# Patient Record
Sex: Male | Born: 1950
Health system: Southern US, Community
[De-identification: ages and names within clinical notes are randomized; demographics above are authoritative.]

## PROBLEM LIST (undated history)

## (undated) DIAGNOSIS — I209 Angina pectoris, unspecified: Secondary | ICD-10-CM

## (undated) DIAGNOSIS — E7800 Pure hypercholesterolemia, unspecified: Secondary | ICD-10-CM

## (undated) DIAGNOSIS — Z87442 Personal history of urinary calculi: Secondary | ICD-10-CM

## (undated) DIAGNOSIS — E119 Type 2 diabetes mellitus without complications: Secondary | ICD-10-CM

## (undated) DIAGNOSIS — I2510 Atherosclerotic heart disease of native coronary artery without angina pectoris: Secondary | ICD-10-CM

## (undated) DIAGNOSIS — Z22322 Carrier or suspected carrier of Methicillin resistant Staphylococcus aureus: Secondary | ICD-10-CM

## (undated) HISTORY — PX: CARDIAC CATHETERIZATION: SHX172

## (undated) HISTORY — PX: WISDOM TOOTH EXTRACTION: SHX21

## (undated) HISTORY — PX: VASECTOMY: SHX75

## (undated) HISTORY — DX: Carrier or suspected carrier of methicillin resistant Staphylococcus aureus: Z22.322

---

## 1999-09-15 ENCOUNTER — Encounter: Admission: RE | Admit: 1999-09-15 | Discharge: 1999-12-14 | Payer: Self-pay | Admitting: Internal Medicine

## 2000-03-08 ENCOUNTER — Emergency Department (HOSPITAL_COMMUNITY): Admission: EM | Admit: 2000-03-08 | Discharge: 2000-03-09 | Payer: Self-pay | Admitting: Emergency Medicine

## 2000-03-09 ENCOUNTER — Encounter: Payer: Self-pay | Admitting: Urology

## 2000-03-09 ENCOUNTER — Encounter: Payer: Self-pay | Admitting: Emergency Medicine

## 2000-03-09 ENCOUNTER — Ambulatory Visit (HOSPITAL_COMMUNITY): Admission: RE | Admit: 2000-03-09 | Discharge: 2000-03-09 | Payer: Self-pay | Admitting: Urology

## 2000-03-29 ENCOUNTER — Encounter: Payer: Self-pay | Admitting: Urology

## 2000-03-29 ENCOUNTER — Encounter: Admission: RE | Admit: 2000-03-29 | Discharge: 2000-03-29 | Payer: Self-pay | Admitting: Hematology and Oncology

## 2000-04-05 ENCOUNTER — Emergency Department (HOSPITAL_COMMUNITY): Admission: EM | Admit: 2000-04-05 | Discharge: 2000-04-05 | Payer: Self-pay | Admitting: *Deleted

## 2000-04-14 ENCOUNTER — Encounter: Payer: Self-pay | Admitting: Urology

## 2000-04-14 ENCOUNTER — Encounter: Admission: RE | Admit: 2000-04-14 | Discharge: 2000-04-14 | Payer: Self-pay | Admitting: Urology

## 2002-12-20 ENCOUNTER — Ambulatory Visit (HOSPITAL_BASED_OUTPATIENT_CLINIC_OR_DEPARTMENT_OTHER): Admission: RE | Admit: 2002-12-20 | Discharge: 2002-12-20 | Payer: Self-pay | Admitting: Urology

## 2002-12-20 ENCOUNTER — Encounter (INDEPENDENT_AMBULATORY_CARE_PROVIDER_SITE_OTHER): Payer: Self-pay | Admitting: Specialist

## 2004-12-28 ENCOUNTER — Ambulatory Visit: Payer: Self-pay | Admitting: Internal Medicine

## 2005-01-05 ENCOUNTER — Ambulatory Visit: Payer: Self-pay | Admitting: Internal Medicine

## 2008-08-22 ENCOUNTER — Ambulatory Visit: Payer: Self-pay | Admitting: Internal Medicine

## 2008-12-09 ENCOUNTER — Ambulatory Visit: Payer: Self-pay | Admitting: Internal Medicine

## 2009-05-11 ENCOUNTER — Encounter: Admission: RE | Admit: 2009-05-11 | Discharge: 2009-05-11 | Payer: Self-pay | Admitting: Internal Medicine

## 2009-05-11 ENCOUNTER — Ambulatory Visit: Payer: Self-pay | Admitting: Internal Medicine

## 2009-05-12 ENCOUNTER — Ambulatory Visit: Payer: Self-pay | Admitting: Internal Medicine

## 2009-05-18 ENCOUNTER — Ambulatory Visit: Payer: Self-pay | Admitting: Internal Medicine

## 2009-05-25 ENCOUNTER — Ambulatory Visit: Payer: Self-pay | Admitting: Internal Medicine

## 2009-10-13 ENCOUNTER — Ambulatory Visit: Payer: Self-pay | Admitting: Internal Medicine

## 2010-10-29 NOTE — Op Note (Signed)
   NAME:  Thomas Washington, Thomas Washington NO.:  [id number]   MEDICAL RECORD NO.:  [id number]                   PATIENT TYPE:  AMB   LOCATION:  [facility]                                 FACILITY:  [facility]   PHYSICIAN:  Susanne Borders, MD                     DATE OF BIRTH:  08/10/1950   DATE OF PROCEDURE:  12/20/2002  DATE OF DISCHARGE:                                 OPERATIVE REPORT   d   PREOPERATIVE DIAGNOSIS:  Desires sterility.   POSTOPERATIVE DIAGNOSIS:  Desires sterility.   PROCEDURE:  Bilateral vasectomy.   SURGEON:  Jamison Neighbor, M.D.   ASSISTANT:  Susanne Borders, MD   ANESTHESIA:  General endotracheal.   COMPLICATIONS:  None.   PATHOLOGY:  Bilateral vas deferens segments to pathology.   INDICATIONS FOR PROCEDURE:  [name] is a 60 year old male who desires  sterility. He was unable to undergo vasectomy at the office as he had  slightly tethered spermatic cords and he had significant pain upon  manipulation. He desired to undergo vasectomy under anesthesia in the  operating room.   DESCRIPTION OF PROCEDURE:  A small opening in the midline of the scrotum is  made with pointed [place] hemostat. The left vas was grasped and pulled  through this opening and the tissue around the vas was removed. The vas was  then clamped with hemostats and a small segment was removed. Both ends of  the vas were cauterized and tied with 3-0 Vicryl. Dartos tissue was  interposed between the two segments. Next, the right vas was grasped and  pulled through the midline wound and extra tissue around the vas was  removed. The vas was clamped with hemostats and a small segment was removed  and sent to pathology. Both ends were cauterized carefully and tied with 3-0  Vicryl. Again dartos tissue was interposed between the two segments. The  dartos defect was closed with a 3-0 Vicryl single 3-0 Vicryl stitch and the  skin was closed with 3-0 Vicryl stitch. Dermabond was applied to the  skin  wound. The patient then awakened easily from his anesthesia and was taken to  the post anesthesia care unit in stable condition. Please noted that Dr.  Logan Bores was present and participated throughout the case. There were no  complications.                                               Susanne Borders, MD    DR/MEDQ  D:  12/20/2002  T:  12/20/2002  Job:  [id number]

## 2012-06-01 ENCOUNTER — Encounter: Payer: Self-pay | Admitting: Internal Medicine

## 2012-06-01 ENCOUNTER — Ambulatory Visit (INDEPENDENT_AMBULATORY_CARE_PROVIDER_SITE_OTHER): Payer: 59 | Admitting: Internal Medicine

## 2012-06-01 ENCOUNTER — Other Ambulatory Visit: Payer: Self-pay | Admitting: Internal Medicine

## 2012-06-01 DIAGNOSIS — E119 Type 2 diabetes mellitus without complications: Secondary | ICD-10-CM

## 2012-06-01 DIAGNOSIS — Z0184 Encounter for antibody response examination: Secondary | ICD-10-CM

## 2012-06-01 DIAGNOSIS — Z23 Encounter for immunization: Secondary | ICD-10-CM

## 2012-06-01 MED ORDER — TETANUS-DIPHTH-ACELL PERTUSSIS 5-2.5-18.5 LF-MCG/0.5 IM SUSP: 0.5000 mL | Freq: Once | INTRAMUSCULAR | Status: DC

## 2012-06-01 NOTE — Progress Notes (Signed)
  Subjective:    Patient ID: Thomas Washington, male    DOB: 09/12/1950, 61 y.o.   MRN: [id number]  HPI Taking new job as Teacher, early years/pre at [employer]. Needs to have proof of hepatitis B series. He had this a number of years ago. Hepatitis B surface antibody drawn today. He also needs a TB skin test. PPD applied. Patient has a history of type 2 diabetes mellitus. Hemoglobin A1c drawn today. He needs to have a physical exam in the near future and will make an appointment. History of hyperlipidemia. Says he had Varicella as a child and they will take his work for that so we do not need to draw titer today. He needs a TD at vaccine which was given today.    Review of Systems     Objective:   Physical Exam not examined        Assessment & Plan:  He'll have his wife who is a physician read PPD in 48-72 hours. He will call me if results are positive. Hemoglobin A1c drawn and is pending. Hep B surface antibody pending.

## 2012-06-01 NOTE — Patient Instructions (Addendum)
Hepatitis B surface antibody drawn today. PPD applied. Hemoglobin A1c pending.

## 2012-06-02 LAB — HEMOGLOBIN A1C: Hgb A1c MFr Bld: 7.8 % — ABNORMAL HIGH (ref <5.7)

## 2012-06-02 LAB — HEPATITIS B SURFACE ANTIBODY,QUALITATIVE: Hep B S Ab: NONREACTIVE

## 2012-06-04 ENCOUNTER — Telehealth: Payer: Self-pay

## 2012-06-04 NOTE — Telephone Encounter (Signed)
Patient informed of labs. Non-reactive hepatitis B titer. Will need series repeated, per Dr. Lenord Fellers.

## 2012-06-14 ENCOUNTER — Ambulatory Visit (INDEPENDENT_AMBULATORY_CARE_PROVIDER_SITE_OTHER): Payer: 59 | Admitting: Internal Medicine

## 2012-06-14 DIAGNOSIS — Z23 Encounter for immunization: Secondary | ICD-10-CM

## 2012-06-14 MED ORDER — HEPATITIS B VAC RECOMBINANT 5 MCG/0.5ML IJ SUSP: 0.5000 mL | Freq: Once | INTRAMUSCULAR | Status: DC

## 2012-06-18 NOTE — Patient Instructions (Signed)
Patient states PPD placed on 06/01/2012 read as negative on 06/03/2012.0 mm induration

## 2012-07-16 ENCOUNTER — Ambulatory Visit (INDEPENDENT_AMBULATORY_CARE_PROVIDER_SITE_OTHER): Payer: BC Managed Care – PPO | Admitting: Internal Medicine

## 2012-07-16 DIAGNOSIS — Z23 Encounter for immunization: Secondary | ICD-10-CM

## 2012-09-11 ENCOUNTER — Encounter: Payer: Self-pay | Admitting: Internal Medicine

## 2012-09-11 ENCOUNTER — Encounter (HOSPITAL_COMMUNITY): Payer: Self-pay | Admitting: *Deleted

## 2012-09-11 ENCOUNTER — Emergency Department (HOSPITAL_COMMUNITY)
Admission: EM | Admit: 2012-09-11 | Discharge: 2012-09-11 | Disposition: A | Discharge status: Home / Self Care | Payer: BC Managed Care – PPO | Attending: Emergency Medicine | Admitting: Emergency Medicine

## 2012-09-11 ENCOUNTER — Emergency Department (HOSPITAL_COMMUNITY): Payer: BC Managed Care – PPO

## 2012-09-11 DIAGNOSIS — E119 Type 2 diabetes mellitus without complications: Secondary | ICD-10-CM | POA: Insufficient documentation

## 2012-09-11 DIAGNOSIS — Z7982 Long term (current) use of aspirin: Secondary | ICD-10-CM | POA: Insufficient documentation

## 2012-09-11 DIAGNOSIS — Z79899 Other long term (current) drug therapy: Secondary | ICD-10-CM | POA: Insufficient documentation

## 2012-09-11 DIAGNOSIS — I319 Disease of pericardium, unspecified: Secondary | ICD-10-CM

## 2012-09-11 DIAGNOSIS — E785 Hyperlipidemia, unspecified: Secondary | ICD-10-CM | POA: Insufficient documentation

## 2012-09-11 DIAGNOSIS — E7800 Pure hypercholesterolemia, unspecified: Secondary | ICD-10-CM | POA: Insufficient documentation

## 2012-09-11 DIAGNOSIS — E669 Obesity, unspecified: Secondary | ICD-10-CM | POA: Insufficient documentation

## 2012-09-11 DIAGNOSIS — R0602 Shortness of breath: Secondary | ICD-10-CM | POA: Insufficient documentation

## 2012-09-11 HISTORY — DX: Pure hypercholesterolemia, unspecified: E78.00

## 2012-09-11 HISTORY — DX: Type 2 diabetes mellitus without complications: E11.9

## 2012-09-11 LAB — BASIC METABOLIC PANEL
BUN: 13 mg/dL (ref 6–23)
CO2: 25 mEq/L (ref 19–32)
Chloride: 100 mEq/L (ref 96–112)
Creatinine, Ser: 0.81 mg/dL (ref 0.50–1.35)
GFR calc Af Amer: >90 mL/min (ref >90)
Potassium: 3.9 mEq/L (ref 3.5–5.1)

## 2012-09-11 LAB — CBC
HCT: 41 % (ref 39.0–52.0)
MCV: 82.3 fL (ref 78.0–100.0)
RBC: 4.98 MIL/uL (ref 4.22–5.81)
RDW: 13.5 % (ref 11.5–15.5)
WBC: 10 10*3/uL (ref 4.0–10.5)

## 2012-09-11 LAB — POCT I-STAT TROPONIN I

## 2012-09-11 LAB — PRO B NATRIURETIC PEPTIDE: Pro B Natriuretic peptide (BNP): 39.6 pg/mL (ref 0–125)

## 2012-09-11 LAB — D-DIMER, QUANTITATIVE: D-Dimer, Quant: <0.27 ug/mL-FEU (ref 0.00–0.48)

## 2012-09-11 NOTE — ED Notes (Signed)
Pt states that his died in his 87s from  Heart attack.  Pt states came home from work yesterday took nap and woke up with reflux feeling in chest.  0200 woke up with symptoms and took antacid.  Went to work and now here with pain that increases breathing.  Pt did take road trip to [place] and back this weekend.  Pt reports sob with the pain and with walking.

## 2012-09-11 NOTE — ED Provider Notes (Signed)
History     CSN: [id number]  Arrival date & time 09/11/12  6578   First MD Initiated Contact with Patient 09/11/12 [phone]      Chief Complaint  Patient presents with  . Chest Pain     HPI Pt states that his died in his 58s from Heart attack. Pt states came home from work yesterday took nap and woke up with reflux feeling in chest. 0200 woke up with symptoms and took antacid. Went to work and now here with pain that increases breathing. Pt did take road trip to [place] and back this weekend. Pt reports sob with the pain and with walking.  Patient describes the shortness of breath as pain with breathing not as air hunger.  Patient also states the pain is worse when he lies on his back and better when he sits up and sits forward.  Past Medical History  Diagnosis Date  . Diabetes mellitus without complication   . Hypercholesteremia     History reviewed. No pertinent past surgical history.  No family history on file.  History  Substance Use Topics  . Smoking status: Never Smoker   . Smokeless tobacco: Never Used  . Alcohol Use: Yes      Review of Systems  All other systems reviewed and are negative.    Allergies  Review of patient's allergies indicates no known allergies.  Home Medications   Current Outpatient Rx  Name  Route  Sig  Dispense  Refill  . aspirin EC 81 MG tablet   Oral   Take 81 mg by mouth daily.         Marland Kitchen atorvastatin (LIPITOR) 40 MG tablet   Oral   Take 40 mg by mouth daily.         Marland Kitchen buPROPion (WELLBUTRIN XL) 300 MG 24 hr tablet   Oral   Take 300 mg by mouth daily.         . Cholecalciferol (VITAMIN D) 2000 UNITS tablet   Oral   Take 4,000 Units by mouth daily.         . Coenzyme Q10 (CO Q 10) 100 MG CAPS   Oral   Take 100 mg by mouth daily.         Marland Kitchen desvenlafaxine (PRISTIQ) 50 MG 24 hr tablet   Oral   Take 50 mg by mouth daily.         Marland Kitchen ezetimibe (ZETIA) 10 MG tablet   Oral   Take 10 mg by mouth daily.         .  metFORMIN (GLUCOPHAGE) 500 MG tablet   Oral   Take 500 mg by mouth 2 (two) times daily with a meal.         . ramipril (ALTACE) 10 MG capsule   Oral   Take 10 mg by mouth daily.           BP 109/70  Pulse 81  Temp(Src) 98.4 F (36.9 C) (Oral)  Resp 20  SpO2 97%  Physical Exam  Nursing note and vitals reviewed. Constitutional: He is oriented to person, place, and time. He appears well-developed and well-nourished. No distress.  HENT:  Head: Normocephalic and atraumatic.  Eyes: Pupils are equal, round, and reactive to light.  Neck: Normal range of motion.  Cardiovascular: Normal rate and intact distal pulses.  Exam reveals no friction rub.   No murmur heard. Pulmonary/Chest: No respiratory distress.  Abdominal: Normal appearance. He exhibits no distension.  Musculoskeletal: Normal range  of motion.  Neurological: He is alert and oriented to person, place, and time. No cranial nerve deficit.  Skin: Skin is warm and dry. No rash noted.  Psychiatric: He has a normal mood and affect. His behavior is normal.    ED Course  Procedures (including critical care time)  Date: 09/11/2012  Rate: 88  Rhythm: normal sinus rhythm  QRS Axis: normal  Intervals: normal  ST/T Wave abnormalities: normal  Conduction Disutrbances: none  Narrative Interpretation: unremarkable     Labs Reviewed  CBC - Abnormal; Notable for the following:    MCHC 36.1 (*)    All other components within normal limits  BASIC METABOLIC PANEL - Abnormal; Notable for the following:    Glucose, Bld 213 (*)    All other components within normal limits  PRO B NATRIURETIC PEPTIDE  D-DIMER, QUANTITATIVE  POCT I-STAT TROPONIN I   Dg Chest 2 View  09/11/2012  *RADIOLOGY REPORT*  Clinical Data: Chest pain  CHEST - 2 VIEW  Comparison: May 11, 2009.  Findings: Cardiomediastinal silhouette appears normal.  Left lung is clear.  Mild linear density is noted in right lung base consistent with subsegmental  atelectasis or scarring. No pneumothorax or pleural effusion is noted.  IMPRESSION: Mild right basilar opacity is noted consistent with subsegmental atelectasis or scarring.  No other abnormality seen.   Original Report Authenticated By: Lupita Raider.,  M.D.      1. Pericarditis       MDM   I suspect the patient has pericarditis.  Pain and symptoms fit this diagnosis.  Care plan was discussed with patient and his wife is a Programmer, multimedia.  They're comfortable with being discharged and will return should symptoms change or it worse or should there not been improvement.       Nelia Shi, MD 09/12/12 [phone]

## 2012-09-11 NOTE — ED Notes (Signed)
NAD noted at time of d/c home with wife 

## 2012-09-11 NOTE — ED Notes (Signed)
Dr. Beaton at the bedside. 

## 2012-09-12 ENCOUNTER — Encounter: Payer: Self-pay | Admitting: Cardiovascular Disease

## 2012-09-12 ENCOUNTER — Ambulatory Visit (INDEPENDENT_AMBULATORY_CARE_PROVIDER_SITE_OTHER): Payer: BC Managed Care – PPO | Admitting: Cardiovascular Disease

## 2012-09-12 VITALS — BP 134/88 | HR 73 | Ht 69.0 in | Wt 208.0 lb

## 2012-09-12 DIAGNOSIS — I309 Acute pericarditis, unspecified: Secondary | ICD-10-CM | POA: Insufficient documentation

## 2012-09-12 DIAGNOSIS — I10 Essential (primary) hypertension: Secondary | ICD-10-CM

## 2012-09-12 NOTE — Patient Instructions (Addendum)
1) Your physician has requested that you have a lexiscan myoview.  Please follow instruction sheet, as given.  2) Your physician has requested that you have an echocardiogram. Echocardiography is a painless test that uses sound waves to create images of your heart. It provides your doctor with information about the size and shape of your heart and how well your heart's chambers and valves are working. This procedure takes approximately one hour. There are no restrictions for this procedure.  3) Your physician recommends that you schedule a follow-up appointment in: 1 month with ekg/  Dr Elease Hashimoto  REDUCE HIGH SODIUM FOODS LIKE CANNED SOUP, GRAVY, SAUCES, READY PREPARED FOODS LIKE FROZEN FOODS; LEAN CUISINE, LASAGNA. BACON, SAUSAGE, LUNCH MEAT, FAST FOODS..hotdogs and chips  DASH Diet The DASH diet stands for "Dietary Approaches to Stop Hypertension." It is a healthy eating plan that has been shown to reduce high blood pressure (hypertension) in as little as 14 days, while also possibly providing other significant health benefits. These other health benefits include reducing the risk of breast cancer after menopause and reducing the risk of type 2 diabetes, heart disease, colon cancer, and stroke. Health benefits also include weight loss and slowing kidney failure in patients with chronic kidney disease.  DIET GUIDELINES  Limit salt (sodium). Your diet should contain less than 1500 mg of sodium daily.  Limit refined or processed carbohydrates. Your diet should include mostly whole grains. Desserts and added sugars should be used sparingly.  Include small amounts of heart-healthy fats. These types of fats include nuts, oils, and tub margarine. Limit saturated and trans fats. These fats have been shown to be harmful in the body. CHOOSING FOODS  The following food groups are based on a 2000 calorie diet. See your Registered Dietitian for individual calorie needs. Grains and Grain Products (6 to 8  servings daily)  Eat More Often: Whole-wheat bread, brown rice, whole-grain or wheat pasta, quinoa, popcorn without added fat or salt (air popped).  Eat Less Often: White bread, white pasta, white rice, cornbread. Vegetables (4 to 5 servings daily)  Eat More Often: Fresh, frozen, and canned vegetables. Vegetables may be raw, steamed, roasted, or grilled with a minimal amount of fat.  Eat Less Often/Avoid: Creamed or fried vegetables. Vegetables in a cheese sauce. Fruit (4 to 5 servings daily)  Eat More Often: All fresh, canned (in natural juice), or frozen fruits. Dried fruits without added sugar. One hundred percent fruit juice ( cup [237 mL] daily).  Eat Less Often: Dried fruits with added sugar. Canned fruit in light or heavy syrup. [employer], Fish, and Poultry (2 servings or less daily. One serving is 3 to 4 oz [85-114 g]).  Eat More Often: Ninety percent or leaner ground beef, tenderloin, sirloin. Round cuts of beef, chicken breast, [place] breast. All fish. Grill, bake, or broil your meat. Nothing should be fried.  Eat Less Often/Avoid: Fatty cuts of meat, [place], or chicken leg, thigh, or wing. Fried cuts of meat or fish. Dairy (2 to 3 servings)  Eat More Often: Low-fat or fat-free milk, low-fat plain or light yogurt, reduced-fat or part-skim cheese.  Eat Less Often/Avoid: Milk (whole, 2%).Whole milk yogurt. Full-fat cheeses. Nuts, Seeds, and Legumes (4 to 5 servings per week)  Eat More Often: All without added salt.  Eat Less Often/Avoid: Salted nuts and seeds, canned beans with added salt. Fats and Sweets (limited)  Eat More Often: Vegetable oils, tub margarines without trans fats, sugar-free gelatin. Mayonnaise and salad dressings.  Eat  Less Often/Avoid: Coconut oils, palm oils, butter, stick margarine, cream, half and half, cookies, candy, pie. FOR MORE INFORMATION The Dash Diet Eating Plan: www.dashdiet.org Document Released: 05/19/2011 Document Revised: 08/22/2011  Document Reviewed: 05/19/2011 [facility] Patient Information 2013 [place], [place].

## 2012-09-12 NOTE — Progress Notes (Signed)
Thomas Washington Date of Birth  1951/01/06       [facility] Office [address]  [address] [place], [place]  [address]   [place], [place]  [address] [phone]     [phone]   Fax  [phone]    Fax [phone]  Problem List: 1. Pericarditis 2. Hyperlipidemia 3. Hypertension 4. Diabetes mellitus   History of Present Illness:  Thomas Washington is a 62 yo who developed CP recently 2 days ago .  Very tender to the touch.  Felt like rib pain.  Thought it was indigestion initially.   Was positional , chest pain eased with sitting forward.  Worse with deep breath.  He drove himself to the ER. D-dimer was normal < 0.27.  Troponin was normal.  He took Motrin and feels much better.    + cough, ( dry)   No fevers, no chills, no  Some dyspnea with walking.  Strong family history of CAD - father died at age 4 of MI.   He works as a Teacher, early years/pre at [employer].  His glucose control is pretty good.  HbA1c is 6.9    Current Outpatient Prescriptions on File Prior to Visit  Medication Sig Dispense Refill  . aspirin EC 81 MG tablet Take 81 mg by mouth daily.      Marland Kitchen atorvastatin (LIPITOR) 40 MG tablet Take 40 mg by mouth daily.      Marland Kitchen buPROPion (WELLBUTRIN XL) 300 MG 24 hr tablet Take 300 mg by mouth daily.      . Cholecalciferol (VITAMIN D) 2000 UNITS tablet Take 4,000 Units by mouth daily.      . Coenzyme Q10 (CO Q 10) 100 MG CAPS Take 100 mg by mouth daily.      Marland Kitchen desvenlafaxine (PRISTIQ) 50 MG 24 hr tablet Take 50 mg by mouth daily.      Marland Kitchen ezetimibe (ZETIA) 10 MG tablet Take 10 mg by mouth daily.      . metFORMIN (GLUCOPHAGE) 500 MG tablet Take 500 mg by mouth 2 (two) times daily with a meal.      . ramipril (ALTACE) 10 MG capsule Take 10 mg by mouth daily.       No current facility-administered medications on file prior to visit.    No Known Allergies  Past Medical History  Diagnosis Date  . Diabetes mellitus without  complication   . Hypercholesteremia     No past surgical history on file.  History  Smoking status  . Never Smoker   Smokeless tobacco  . Never Used    History  Alcohol Use  . Yes    No family history on file.  Reviw of Systems:  Reviewed in the HPI.  All other systems are negative.  Physical Exam: Blood pressure 134/88, pulse 73, height 5\' 9"  (1.753 m), weight 208 lb (94.348 kg), SpO2 98.00%. General: Well developed, well nourished, in no acute distress.  Head: Normocephalic, atraumatic, sclera non-icteric, mucus membranes are moist,   Neck: Supple. Carotids are 2 + without bruits. No JVD   Lungs: Clear   Heart: RR, normal S1, S2  Abdomen: Soft, non-tender, non-distended with normal bowel sounds.  Msk:  Strength and tone are normal   Extremities: No clubbing or cyanosis. No edema.  Distal pedal pulses are 2+ and equal    Neuro: CN II - XII intact.  Alert and oriented X 3.   Psych:  Normal  ECG: 09/11/2012:  NSR. Mild ST elevation and PR depression in the lateral leads  Assessment / Plan:

## 2012-09-12 NOTE — Assessment & Plan Note (Signed)
Thomas Washington presents with a history of chest pain for the past several days. He was seen in the emergency department at [facility] yesterday and was diagnosed as having pericarditis. I have reviewed his EKG and I agree that the most likely scenario is acute pericarditis. We'll get an echocardiogram to look at left ventricular function and to look at his pericardium.  Is a very strong family history of coronary artery disease. He also has a history of hyperlipidemia, hypertension, and diabetes mellitus. I think we also need to rule out coronary artery disease. Will get a Lexiscan Myoview for further evaluation of CAD.  I seen again in one month for followup visit. An EKG at that time.

## 2012-09-12 NOTE — Assessment & Plan Note (Signed)
I've recommended that he decrease his salt intake. Will give him some information on the DASH diet.  I've asked him to exercise on a regular basis.

## 2012-09-13 ENCOUNTER — Ambulatory Visit (HOSPITAL_COMMUNITY): Payer: BC Managed Care – PPO | Attending: Cardiology | Admitting: Radiology

## 2012-09-13 DIAGNOSIS — I300 Acute nonspecific idiopathic pericarditis: Secondary | ICD-10-CM

## 2012-09-13 DIAGNOSIS — I309 Acute pericarditis, unspecified: Secondary | ICD-10-CM

## 2012-09-13 DIAGNOSIS — I319 Disease of pericardium, unspecified: Secondary | ICD-10-CM | POA: Insufficient documentation

## 2012-09-13 DIAGNOSIS — I10 Essential (primary) hypertension: Secondary | ICD-10-CM

## 2012-09-13 NOTE — Progress Notes (Signed)
Echocardiogram performed by Aida Raider.

## 2012-09-19 ENCOUNTER — Ambulatory Visit (HOSPITAL_COMMUNITY): Payer: BC Managed Care – PPO | Attending: Cardiovascular Disease | Admitting: Radiology

## 2012-09-19 VITALS — BP 131/84 | HR 66 | Ht 69.0 in | Wt 207.0 lb

## 2012-09-19 DIAGNOSIS — I309 Acute pericarditis, unspecified: Secondary | ICD-10-CM

## 2012-09-19 DIAGNOSIS — E119 Type 2 diabetes mellitus without complications: Secondary | ICD-10-CM

## 2012-09-19 DIAGNOSIS — I10 Essential (primary) hypertension: Secondary | ICD-10-CM

## 2012-09-19 DIAGNOSIS — R079 Chest pain, unspecified: Secondary | ICD-10-CM

## 2012-09-19 DIAGNOSIS — E785 Hyperlipidemia, unspecified: Secondary | ICD-10-CM

## 2012-09-19 DIAGNOSIS — Z8249 Family history of ischemic heart disease and other diseases of the circulatory system: Secondary | ICD-10-CM | POA: Insufficient documentation

## 2012-09-19 MED ORDER — TECHNETIUM TC 99M SESTAMIBI GENERIC - CARDIOLITE
11.0000 | Freq: Once | INTRAVENOUS | Status: AC | PRN
  Administered 2012-09-19: 11 via INTRAVENOUS

## 2012-09-19 MED ORDER — REGADENOSON 0.4 MG/5ML IV SOLN
0.4000 mg | Freq: Once | INTRAVENOUS | Status: AC
  Administered 2012-09-19: 0.4 mg via INTRAVENOUS

## 2012-09-19 MED ORDER — TECHNETIUM TC 99M SESTAMIBI GENERIC - CARDIOLITE
33.0000 | Freq: Once | INTRAVENOUS | Status: AC | PRN
  Administered 2012-09-19: 33 via INTRAVENOUS

## 2012-09-19 NOTE — Progress Notes (Signed)
[facility] SITE 3 NUCLEAR MED [address] [place], [place] [address] [phone]    Cardiology Nuclear Med Study  Thomas Washington is a 62 y.o. male     MRN : [id number]     DOB: 1950-10-28  Procedure Date: 09/19/2012  Nuclear Med Background Indication for Stress Test:  Evaluation for Ischemia History:  ~10 yrs ago [id number] per pt.; 09/11/12 ED with chest pain=pericarditis; 09/13/12 Echo:EF=60% Cardiac Risk Factors: Family History - CAD, Hypertension, Lipids and NIDDM  Symptoms:  Chest Pain (last episode of chest discomfort was last week)   Nuclear Pre-Procedure Caffeine/Decaff Intake:  None > 12 hrs NPO After: 10:00pm   Lungs:  Clear. O2 Sat: 96% on room air. IV 0.9% NS with Angio Cath:  22g  IV Site: R Antecubital x 1, tolerated well IV Started by:  Irean Hong, RN  Chest Size (in):  46 Cup Size: n/a  Height: 5\' 9"  (1.753 m)  Weight:  207 lb (93.895 kg)  BMI:  Body mass index is 30.55 kg/(m^2). Tech Comments:  n/a    Nuclear Med Study 1 or 2 day study: 1 day  Stress Test Type:  Treadmill/Lexiscan  Reading MD: Kristeen Miss, MD  Order Authorizing Provider:  Kristeen Miss, MD  Resting Radionuclide: Technetium 76m Sestamibi  Resting Radionuclide Dose: 10.9 mCi   Stress Radionuclide:  Technetium 29m Sestamibi  Stress Radionuclide Dose: 33.0 mCi           Stress Protocol Rest HR: 66 Stress HR: 106  Rest BP: 131/84 Stress BP: 132/76  Exercise Time (min): 2:00 METS: n/a   Predicted Max HR: 161 bpm % Max HR: 65.84 bpm Rate Pressure Product: 46962   Dose of Adenosine (mg):  n/a Dose of Lexiscan: 0.4 mg  Dose of Atropine (mg): n/a Dose of Dobutamine: n/a mcg/kg/min (at max HR)  Stress Test Technologist: Smiley Houseman, CMA-N  Nuclear Technologist:  Doyne Keel, CNMT     Rest Procedure:  Myocardial perfusion imaging was performed at rest 45 minutes following the intravenous administration of Technetium 45m Sestamibi.  Rest ECG: NSR - Normal EKG  Stress  Procedure:  The patient received IV Lexiscan 0.4 mg over 15-seconds with concurrent low level exercise and then Technetium 13m Sestamibi was injected at 30-seconds while the patient continued walking one more minute.  Quantitative spect images were obtained after a 45-minute delay.  Stress ECG: No significant change from baseline ECG  QPS Raw Data Images:  Normal; no motion artifact; normal heart/lung ratio. Stress Images:  There is mild apical thinning with normal uptake in other regions. Rest Images:  There is mild apical thinning with normal uptake in other regions. Subtraction (SDS):  No evidence of ischemia. Transient Ischemic Dilatation (Normal <1.22):  0.95 Lung/Heart Ratio (Normal <0.45):  0.27  Quantitative Gated Spect Images QGS EDV:  73 ml QGS ESV:  21 ml  Impression Exercise Capacity:  Lexiscan with low level exercise. BP Response:  Normal blood pressure response. Clinical Symptoms:  No significant symptoms noted. ECG Impression:  No significant ST segment change suggestive of ischemia. Comparison with Prior Nuclear Study: No previous nuclear study performed  Overall Impression:  Low risk stress nuclear study.  There is mild apical thinning but this appears to be artifact.    LV Ejection Fraction: 71%.  LV Wall Motion:  NL LV Function; NL Wall Motion,.   Vesta Mixer, Montez Hageman., MD, [facility] 09/19/2012, 4:09 PM Office - [phone] Pager [phone]

## 2012-10-01 ENCOUNTER — Encounter: Payer: Self-pay | Admitting: *Deleted

## 2012-10-09 ENCOUNTER — Encounter: Payer: Self-pay | Admitting: Cardiovascular Disease

## 2012-10-09 ENCOUNTER — Ambulatory Visit (INDEPENDENT_AMBULATORY_CARE_PROVIDER_SITE_OTHER): Payer: BC Managed Care – PPO | Admitting: Cardiovascular Disease

## 2012-10-09 VITALS — BP 140/94 | HR 79 | Ht 69.0 in | Wt 210.0 lb

## 2012-10-09 DIAGNOSIS — I309 Acute pericarditis, unspecified: Secondary | ICD-10-CM

## 2012-10-09 NOTE — Patient Instructions (Addendum)
Follow up if needed

## 2012-10-09 NOTE — Assessment & Plan Note (Signed)
Thomas Washington is doing very well. He's not had any further episodes of chest pain. I suspect he may have had some mild pericarditis.  His echocardiogram is normal. A stress Myoview study was normal.  We'll see him on an as-needed basis. We'll followup with his general medical Dr. for his hypertension.

## 2012-10-09 NOTE — Progress Notes (Signed)
Thomas Washington  01/30/1951       [facility] Office [address]  [address] [place], [place]  [address]   [place], [place]  [address] [phone]     [phone]   Fax  [phone]    Fax [phone]  Problem List: 1. Pericarditis 2. Hyperlipidemia 3. Hypertension 4. Diabetes mellitus   History of Present Illness:  Thomas Washington is a 62 yo who developed CP recently 2 days ago .  Very tender to the touch.  Felt like rib pain.  Thought it was indigestion initially.   Was positional , chest pain eased with sitting forward.  Worse with deep breath.  He drove himself to the ER. D-dimer was normal < 0.27.  Troponin was normal.  He took Motrin and feels much better.    + cough, ( dry)   No fevers, no chills, no  Some dyspnea with walking.  Strong family history of CAD - father died at age 74 of MI.   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  HbA1c is 6.9  He had a stress myoview which was normal. An echocardiogram which revealed normal left ventricular systolic function. He was found to have some diastolic dysfunction.  His back doing all of his normal activities without difficulty.    Current Outpatient Prescriptions on File Prior to Visit  Medication Sig Dispense Refill  . aspirin EC 81 MG tablet Take 81 mg by mouth daily.      Marland Kitchen atorvastatin (LIPITOR) 40 MG tablet Take 40 mg by mouth daily.      Marland Kitchen buPROPion (WELLBUTRIN XL) 300 MG 24 hr tablet Take 300 mg by mouth daily.      . Cholecalciferol (VITAMIN D) 2000 UNITS tablet Take 4,000 Units by mouth daily.      . Coenzyme Q10 (CO Q 10) 100 MG CAPS Take 100 mg by mouth daily.      Marland Kitchen desvenlafaxine (PRISTIQ) 50 MG 24 hr tablet Take 50 mg by mouth daily.      . metFORMIN (GLUCOPHAGE) 500 MG tablet Take 500 mg by mouth 2 (two) times daily with a meal.      . ramipril (ALTACE) 10 MG capsule Take 10 mg by mouth daily.       No  current facility-administered medications on file prior to visit.    No Known Allergies  Past Medical History  Diagnosis Date  . Diabetes mellitus without complication   . Hypercholesteremia     No past surgical history on file.  History  Smoking status  . Never Smoker   Smokeless tobacco  . Never Used    History  Alcohol Use  . Yes    No family history on file.  Reviw of Systems:  Reviewed in the HPI.  All other systems are negative.  Physical Exam: Blood pressure 140/94, pulse 79, height 5\' 9"  (1.753 m), weight 210 lb (95.255 kg), SpO2 97.00%. General: Well developed, well nourished, in no acute distress.  Head: Normocephalic, atraumatic, sclera non-icteric, mucus membranes are moist,   Neck: Supple. Carotids are 2 + without bruits. No JVD   Lungs: Clear   Heart: RR, normal S1, S2  Abdomen: Soft, non-tender, non-distended with normal bowel sounds.  Msk:  Strength and tone are normal   Extremities: No clubbing or cyanosis. No edema.  Distal pedal pulses are 2+ and equal  Neuro: CN II - XII intact.  Alert and oriented X 3.   Psych:  Normal   ECG: 09/11/2012:  NSR. Mild ST elevation and PR depression in the lateral leads  Assessment / Plan:

## 2013-01-14 ENCOUNTER — Other Ambulatory Visit: Payer: Self-pay | Admitting: Internal Medicine

## 2013-01-15 ENCOUNTER — Other Ambulatory Visit: Payer: BC Managed Care – PPO | Admitting: Internal Medicine

## 2013-01-15 DIAGNOSIS — Z79899 Other long term (current) drug therapy: Secondary | ICD-10-CM

## 2013-01-15 DIAGNOSIS — Z130 Encounter for screening for diseases of the blood and blood-forming organs and certain disorders involving the immune mechanism: Secondary | ICD-10-CM

## 2013-01-15 DIAGNOSIS — E785 Hyperlipidemia, unspecified: Secondary | ICD-10-CM

## 2013-01-15 DIAGNOSIS — Z125 Encounter for screening for malignant neoplasm of prostate: Secondary | ICD-10-CM

## 2013-01-15 DIAGNOSIS — E119 Type 2 diabetes mellitus without complications: Secondary | ICD-10-CM

## 2013-01-15 LAB — CBC WITH DIFFERENTIAL/PLATELET
Basophils Absolute: 0.1 10*3/uL (ref 0.0–0.1)
Basophils Relative: 1 % (ref 0–1)
Hemoglobin: 15 g/dL (ref 13.0–17.0)
Lymphocytes Relative: 35 % (ref 12–46)
MCHC: 34.3 g/dL (ref 30.0–36.0)
Neutro Abs: 3.8 10*3/uL (ref 1.7–7.7)
Neutrophils Relative %: 53 % (ref 43–77)
RDW: 14.6 % (ref 11.5–15.5)
WBC: 7 10*3/uL (ref 4.0–10.5)

## 2013-01-15 LAB — HEMOGLOBIN A1C
Hgb A1c MFr Bld: 7.5 % — ABNORMAL HIGH (ref <5.7)
Mean Plasma Glucose: 169 mg/dL — ABNORMAL HIGH (ref <117)

## 2013-01-16 LAB — COMPREHENSIVE METABOLIC PANEL
ALT: 30 U/L (ref 0–53)
AST: 20 U/L (ref 0–37)
Albumin: 4.4 g/dL (ref 3.5–5.2)
Alkaline Phosphatase: 65 U/L (ref 39–117)
Chloride: 99 mEq/L (ref 96–112)
Potassium: 4.3 mEq/L (ref 3.5–5.3)
Sodium: 137 mEq/L (ref 135–145)
Total Protein: 7.1 g/dL (ref 6.0–8.3)

## 2013-01-16 LAB — LIPID PANEL: LDL Cholesterol: 124 mg/dL — ABNORMAL HIGH (ref 0–99)

## 2013-01-16 LAB — PSA: PSA: 1.32 ng/mL (ref <=4.00)

## 2013-01-17 ENCOUNTER — Encounter: Payer: BC Managed Care – PPO | Admitting: Internal Medicine

## 2013-01-18 ENCOUNTER — Encounter: Payer: BC Managed Care – PPO | Admitting: Internal Medicine

## 2013-02-14 ENCOUNTER — Encounter: Payer: BC Managed Care – PPO | Admitting: Internal Medicine

## 2013-03-15 ENCOUNTER — Encounter: Payer: Self-pay | Admitting: Internal Medicine

## 2013-03-15 ENCOUNTER — Ambulatory Visit (INDEPENDENT_AMBULATORY_CARE_PROVIDER_SITE_OTHER): Payer: BC Managed Care – PPO | Admitting: Internal Medicine

## 2013-03-15 DIAGNOSIS — Z5189 Encounter for other specified aftercare: Secondary | ICD-10-CM

## 2013-03-15 DIAGNOSIS — E785 Hyperlipidemia, unspecified: Secondary | ICD-10-CM

## 2013-03-15 DIAGNOSIS — E119 Type 2 diabetes mellitus without complications: Secondary | ICD-10-CM

## 2013-03-15 DIAGNOSIS — I10 Essential (primary) hypertension: Secondary | ICD-10-CM

## 2013-03-15 DIAGNOSIS — E8881 Metabolic syndrome: Secondary | ICD-10-CM

## 2013-03-15 DIAGNOSIS — Z23 Encounter for immunization: Secondary | ICD-10-CM

## 2013-03-15 DIAGNOSIS — R5381 Other malaise: Secondary | ICD-10-CM

## 2013-03-15 LAB — POCT URINALYSIS DIPSTICK
Bilirubin, UA: NEGATIVE
Glucose, UA: NEGATIVE
Ketones, UA: NEGATIVE
Leukocytes, UA: NEGATIVE
Protein, UA: NEGATIVE
Spec Grav, UA: 1.025
pH, UA: 5.5

## 2013-03-15 MED ORDER — PNEUMOCOCCAL VAC POLYVALENT 25 MCG/0.5ML IJ INJ: 0.5000 mL | INJECTION | INTRAMUSCULAR | Status: AC

## 2013-03-16 LAB — TESTOSTERONE: Testosterone: 370 ng/dL (ref 300–890)

## 2013-03-16 LAB — MICROALBUMIN, URINE: Microalb, Ur: 4.63 mg/dL — ABNORMAL HIGH (ref 0.00–1.89)

## 2013-03-17 NOTE — Patient Instructions (Addendum)
Testosterone level will be checked. Watch diabetic control. Return in 4 months. Hepatitis B titer will be rechecked today. Pneumovax immunization given today. Januvia started

## 2013-03-23 ENCOUNTER — Telehealth: Payer: Self-pay | Admitting: Internal Medicine

## 2013-03-23 NOTE — Telephone Encounter (Signed)
Discussed with Dr. Smith Mince, his wife, elevated hemoglobin A1c. Patient now starting to check Accu-Cheks more regularly and at times has been 140-150. We have discussed starting him on today via 100 mg daily. That prescription called to [facility]. He is going to followup with me in 3 months. Also, he has developed thrush on doxycycline which he was taking for an infection of his right toe.: Diflucan 150 mg daily for 3 days also to [facility]

## 2013-03-25 NOTE — Progress Notes (Signed)
  Subjective:    Patient ID: Thomas Washington, male    DOB: [birth year], 62 y.o.   MRN: [id number]  HPI 62 year old White male in for health maintenance and evaluation of medical issues. Patient has history of type 2 diabetes mellitus, hyperlipidemia, obesity, hypertension, metabolic syndrome.  No known drug allergies  Had colonoscopy July 2006.  History of Schatzki's ring 1991. History of Candida esophagitis 1991.  In April 2014, he was diagnosed with pericarditis by emergency department physician and was also seen by Dr. Nahser,cardiologist.  Family history: Mother with history of stroke died with complications of dementia and stroke. Father died at age 61 suddenly presumably of a sudden MI. One sister in good health.  Social history: Married one with one adult daughter. Currently working as a Teacher, early years/pre at [employer], formerly [employer]. 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 Nonsmoker. Social alcohol consumption.  Sees Dr. Emily Filbert for eye exam.  Had negative exercise tolerance test in 1995 by Dr. Caprice Kluver.    Review of Systems  Constitutional: Positive for fatigue.  HENT: Negative.   Respiratory: Negative.   Cardiovascular: Negative.   Gastrointestinal: Negative.   Endocrine: Negative.   Genitourinary: Negative.   Allergic/Immunologic: Negative.   Neurological: Negative.   Hematological: Negative.   Psychiatric/Behavioral: Negative.        Objective:   Physical Exam  Vitals reviewed. Constitutional: He is oriented to person, place, and time. He appears well-developed and well-nourished. No distress.  HENT:  Head: Normocephalic and atraumatic.  Right Ear: External ear normal.  Left Ear: External ear normal.  Mouth/Throat: Oropharynx is clear and moist. No oropharyngeal exudate.  Eyes: Conjunctivae and EOM are normal. Pupils are equal, round, and reactive to light. Right eye exhibits no discharge. Left eye exhibits no  discharge. No scleral icterus.  Neck: Neck supple. No JVD present. No thyromegaly present.  Cardiovascular: Normal rate, regular rhythm, normal heart sounds and intact distal pulses.   No murmur heard. Pulmonary/Chest: Effort normal and breath sounds normal. No respiratory distress. He has no wheezes. He has no rales. He exhibits no tenderness.  Abdominal: Soft. Bowel sounds are normal. He exhibits no distension and no mass. There is no tenderness. There is no rebound and no guarding.  Genitourinary: Prostate normal.  Musculoskeletal: Normal range of motion. He exhibits no edema.  Lymphadenopathy:    He has no cervical adenopathy.  Neurological: He is alert and oriented to person, place, and time. He has normal reflexes. No cranial nerve deficit. Coordination normal.  Skin: Skin is warm and dry. No rash noted. He is not diaphoretic. No erythema. No pallor.  Psychiatric: He has a normal mood and affect. His behavior is normal. Judgment and thought content normal.          Assessment & Plan:  Adult onset diabetes mellitus  Obesity  Hypertension  Hyperlipidemia  Metabolic syndrome  But she wants testosterone level checked  History of GE reflux  Plan: Cholesterol is elevated at 220 and triglycerides are 240. LDL cholesterol is 124. Fasting glucose is 192. Hemoglobin A1c is 7.5%. Patient has received third hepatitis B vaccine. He formally received the entire 3 dose series several years ago but did not have sufficient titer so we have repeated this as required by his employment. Patient is to return in 3-4 months for followup of diabetes. Encouraged diet and exercise and weight loss.

## 2013-05-21 ENCOUNTER — Other Ambulatory Visit: Payer: Self-pay | Admitting: *Deleted

## 2013-05-21 MED ORDER — SITAGLIPTIN PHOSPHATE 100 MG PO TABS: 100.0000 mg | ORAL_TABLET | Freq: Every day | ORAL | Status: DC

## 2013-07-25 ENCOUNTER — Other Ambulatory Visit: Payer: BC Managed Care – PPO | Admitting: Internal Medicine

## 2013-07-25 DIAGNOSIS — E785 Hyperlipidemia, unspecified: Secondary | ICD-10-CM

## 2013-07-25 DIAGNOSIS — Z79899 Other long term (current) drug therapy: Secondary | ICD-10-CM

## 2013-07-25 DIAGNOSIS — E119 Type 2 diabetes mellitus without complications: Secondary | ICD-10-CM

## 2013-07-25 LAB — LIPID PANEL
CHOL/HDL RATIO: 3.2 ratio
Cholesterol: 161 mg/dL (ref 0–200)
HDL: 50 mg/dL (ref >39)
LDL Cholesterol: 98 mg/dL (ref 0–99)
Triglycerides: 63 mg/dL (ref <150)
VLDL: 13 mg/dL (ref 0–40)

## 2013-07-25 LAB — HEMOGLOBIN A1C
HEMOGLOBIN A1C: 6.7 % — AB (ref <5.7)
Mean Plasma Glucose: 146 mg/dL — ABNORMAL HIGH (ref <117)

## 2013-07-25 LAB — HEPATIC FUNCTION PANEL
ALT: 21 U/L (ref 0–53)
AST: 16 U/L (ref 0–37)
Albumin: 4.6 g/dL (ref 3.5–5.2)
Alkaline Phosphatase: 69 U/L (ref 39–117)
BILIRUBIN DIRECT: 0.1 mg/dL (ref 0.0–0.3)
BILIRUBIN INDIRECT: 0.4 mg/dL (ref 0.2–1.2)
Total Bilirubin: 0.5 mg/dL (ref 0.2–1.2)
Total Protein: 7.5 g/dL (ref 6.0–8.3)

## 2013-07-26 ENCOUNTER — Encounter: Payer: Self-pay | Admitting: Internal Medicine

## 2013-07-26 ENCOUNTER — Ambulatory Visit (INDEPENDENT_AMBULATORY_CARE_PROVIDER_SITE_OTHER): Payer: BC Managed Care – PPO | Admitting: Internal Medicine

## 2013-07-26 VITALS — BP 126/84 | HR 80 | Temp 98.2°F | Wt 201.5 lb

## 2013-07-26 DIAGNOSIS — Z8249 Family history of ischemic heart disease and other diseases of the circulatory system: Secondary | ICD-10-CM

## 2013-07-26 DIAGNOSIS — E119 Type 2 diabetes mellitus without complications: Secondary | ICD-10-CM

## 2013-10-24 ENCOUNTER — Other Ambulatory Visit: Payer: Self-pay

## 2013-10-24 MED ORDER — BUPROPION HCL ER (XL) 300 MG PO TB24: 300.0000 mg | ORAL_TABLET | Freq: Every day | ORAL | Status: DC

## 2014-01-02 ENCOUNTER — Other Ambulatory Visit: Payer: Self-pay | Admitting: Internal Medicine

## 2014-01-19 DIAGNOSIS — Z8249 Family history of ischemic heart disease and other diseases of the circulatory system: Secondary | ICD-10-CM | POA: Insufficient documentation

## 2014-01-19 NOTE — Progress Notes (Signed)
   Subjective:    Patient ID: Thomas Washington, male    DOB: 1950/06/15, 63 y.o.   MRN: [id number]  HPI  In today to followup on type 2 diabetes mellitus. Several weeks ago, started on Januvia 100 mg daily and is also on metformin. Accu-Cheks have improved significantly. In August 2014 Accu-Chek was 7.5%.    Review of Systems     Objective:   Physical Exam Not examined. Spent 10 minutes speaking with patient about type 2 diabetes mellitus       Assessment & Plan:  Controlled type 2 diabetes mellitus-continue metformin and Januvia. Hemoglobin A1c is 6.7% and previously was 7.5% in August 2014.  Hypertension-stable on current regimen  History of hyperlipidemia-continue Lipitor  Plan: Continue to monitor Accu-Cheks at home. Continue same medications. Return in October for physical examination.

## 2014-01-19 NOTE — Patient Instructions (Signed)
Continue same medications. Continue to monitor Accu-Cheks. Physical exam due October 2015.

## 2014-01-22 ENCOUNTER — Other Ambulatory Visit: Payer: Self-pay

## 2014-01-22 MED ORDER — RAMIPRIL 5 MG PO CAPS: 5.0000 mg | ORAL_CAPSULE | Freq: Every day | ORAL | Status: DC

## 2014-04-03 ENCOUNTER — Other Ambulatory Visit: Payer: Self-pay | Admitting: Internal Medicine

## 2014-04-04 ENCOUNTER — Other Ambulatory Visit: Payer: Self-pay

## 2014-04-04 MED ORDER — SITAGLIPTIN PHOSPHATE 100 MG PO TABS: 100.0000 mg | ORAL_TABLET | Freq: Every day | ORAL | Status: DC

## 2014-10-31 ENCOUNTER — Other Ambulatory Visit: Payer: Self-pay | Admitting: Internal Medicine

## 2014-10-31 DIAGNOSIS — Z125 Encounter for screening for malignant neoplasm of prostate: Secondary | ICD-10-CM

## 2014-10-31 DIAGNOSIS — Z13220 Encounter for screening for lipoid disorders: Secondary | ICD-10-CM

## 2014-10-31 DIAGNOSIS — Z0000 Encounter for general adult medical examination without abnormal findings: Secondary | ICD-10-CM

## 2014-10-31 DIAGNOSIS — E119 Type 2 diabetes mellitus without complications: Secondary | ICD-10-CM

## 2014-10-31 LAB — CBC WITH DIFFERENTIAL/PLATELET
BASOS PCT: 1 % (ref 0–1)
Basophils Absolute: 0.1 10*3/uL (ref 0.0–0.1)
Eosinophils Absolute: 0.2 10*3/uL (ref 0.0–0.7)
Eosinophils Relative: 2 % (ref 0–5)
HEMATOCRIT: 45.4 % (ref 39.0–52.0)
HEMOGLOBIN: 15.3 g/dL (ref 13.0–17.0)
Lymphocytes Relative: 30 % (ref 12–46)
Lymphs Abs: 2.6 10*3/uL (ref 0.7–4.0)
MCH: 29.3 pg (ref 26.0–34.0)
MCHC: 33.7 g/dL (ref 30.0–36.0)
MCV: 86.8 fL (ref 78.0–100.0)
MONOS PCT: 9 % (ref 3–12)
MPV: 9.1 fL (ref 8.6–12.4)
Monocytes Absolute: 0.8 10*3/uL (ref 0.1–1.0)
NEUTROS PCT: 58 % (ref 43–77)
Neutro Abs: 4.9 10*3/uL (ref 1.7–7.7)
Platelets: 267 10*3/uL (ref 150–400)
RBC: 5.23 MIL/uL (ref 4.22–5.81)
RDW: 14.6 % (ref 11.5–15.5)
WBC: 8.5 10*3/uL (ref 4.0–10.5)

## 2014-10-31 LAB — LIPID PANEL
Cholesterol: 169 mg/dL (ref 0–200)
HDL: 57 mg/dL (ref >=40)
LDL CALC: 94 mg/dL (ref 0–99)
TRIGLYCERIDES: 89 mg/dL (ref <150)
Total CHOL/HDL Ratio: 3 Ratio
VLDL: 18 mg/dL (ref 0–40)

## 2014-10-31 LAB — COMPLETE METABOLIC PANEL WITH GFR
ALK PHOS: 61 U/L (ref 39–117)
ALT: 29 U/L (ref 0–53)
AST: 20 U/L (ref 0–37)
Albumin: 4.2 g/dL (ref 3.5–5.2)
BILIRUBIN TOTAL: 0.6 mg/dL (ref 0.2–1.2)
BUN: 13 mg/dL (ref 6–23)
CO2: 29 mEq/L (ref 19–32)
Calcium: 10 mg/dL (ref 8.4–10.5)
Chloride: 102 mEq/L (ref 96–112)
Creat: 1.03 mg/dL (ref 0.50–1.35)
GFR, EST NON AFRICAN AMERICAN: 77 mL/min
Glucose, Bld: 128 mg/dL — ABNORMAL HIGH (ref 70–99)
Potassium: 5.1 mEq/L (ref 3.5–5.3)
Sodium: 139 mEq/L (ref 135–145)
TOTAL PROTEIN: 7.3 g/dL (ref 6.0–8.3)

## 2014-11-01 LAB — HEMOGLOBIN A1C
HEMOGLOBIN A1C: 7 % — AB (ref <5.7)
MEAN PLASMA GLUCOSE: 154 mg/dL — AB (ref <117)

## 2014-11-01 LAB — PSA: PSA: 1.45 ng/mL (ref <=4.00)

## 2014-11-03 ENCOUNTER — Ambulatory Visit (INDEPENDENT_AMBULATORY_CARE_PROVIDER_SITE_OTHER): Payer: No Typology Code available for payment source | Admitting: Internal Medicine

## 2014-11-03 ENCOUNTER — Encounter: Payer: Self-pay | Admitting: Internal Medicine

## 2014-11-03 VITALS — BP 124/84 | HR 61 | Temp 97.3°F | Ht 67.0 in | Wt 199.0 lb

## 2014-11-03 DIAGNOSIS — E785 Hyperlipidemia, unspecified: Secondary | ICD-10-CM | POA: Diagnosis not present

## 2014-11-03 DIAGNOSIS — G629 Polyneuropathy, unspecified: Secondary | ICD-10-CM | POA: Diagnosis not present

## 2014-11-03 DIAGNOSIS — Z23 Encounter for immunization: Secondary | ICD-10-CM | POA: Diagnosis not present

## 2014-11-03 DIAGNOSIS — E1142 Type 2 diabetes mellitus with diabetic polyneuropathy: Secondary | ICD-10-CM

## 2014-11-03 DIAGNOSIS — E669 Obesity, unspecified: Secondary | ICD-10-CM

## 2014-11-03 DIAGNOSIS — Z0000 Encounter for general adult medical examination without abnormal findings: Secondary | ICD-10-CM

## 2014-11-03 DIAGNOSIS — E1342 Other specified diabetes mellitus with diabetic polyneuropathy: Secondary | ICD-10-CM | POA: Diagnosis not present

## 2014-11-03 DIAGNOSIS — E119 Type 2 diabetes mellitus without complications: Secondary | ICD-10-CM

## 2014-11-03 DIAGNOSIS — I10 Essential (primary) hypertension: Secondary | ICD-10-CM | POA: Diagnosis not present

## 2014-11-03 DIAGNOSIS — E8881 Metabolic syndrome: Secondary | ICD-10-CM

## 2014-11-03 LAB — POCT URINALYSIS DIPSTICK
Bilirubin, UA: NEGATIVE
Blood, UA: NEGATIVE
Glucose, UA: NEGATIVE
Ketones, UA: NEGATIVE
LEUKOCYTES UA: NEGATIVE
Nitrite, UA: NEGATIVE
PH UA: 6
Protein, UA: NEGATIVE
SPEC GRAV UA: 1.02
UROBILINOGEN UA: NEGATIVE

## 2014-11-03 LAB — TSH: TSH: 3.35 u[IU]/mL (ref 0.350–4.500)

## 2014-11-03 LAB — VITAMIN B12: VITAMIN B 12: 414 pg/mL (ref 211–911)

## 2014-11-04 LAB — MICROALBUMIN / CREATININE URINE RATIO
Creatinine, Urine: 94.4 mg/dL
MICROALB UR: 1.4 mg/dL (ref <2.0)
MICROALB/CREAT RATIO: 14.8 mg/g (ref 0.0–30.0)

## 2014-11-05 ENCOUNTER — Encounter: Payer: Self-pay | Admitting: Internal Medicine

## 2014-11-05 NOTE — Patient Instructions (Signed)
Continue diet exercise and weight loss efforts. Prevnar given. Return in 6 months for office visit lipid panel liver functions and hemoglobin A1c. Continue same medications.

## 2014-11-05 NOTE — Progress Notes (Signed)
Subjective:    Patient ID: Thomas Washington, male    DOB: 1950/06/27, 64 y.o.   MRN: [id number]  HPI  64 year old white male with history of controlled type 2 diabetes mellitus, hyperlipidemia, essential hypertension, metabolic syndrome, obesity in today for health maintenance exam. Diabetes is controlled with Januvia and metformin. He takes Lopid dose Prempro for hypertension. Is on Lipitor for hyperlipidemia. He also takes coenzyme Q. He takes Effexor and Wellbutrin.  Weight is 199 pounds and previously was 208 pounds in October 2014. Feels that Januvia as helped him lose some weight.  No known drug allergies  Colonoscopy done July 2006.  History of shows he's running 1991. History of Candida esophagitis 1991.  In April 2014 he was diagnosed with pericarditis by emergency department physician and also was evaluated by Dr. Delphina Cahill, cardiologist.  Sees Dr. Delman Cheadle for diabetic eye exam yearly.  Had negative exercise tolerance test in 1995 by Dr. Aldona Bar.  Family history: Mother with history of stroke died with complications of dementia and stroke. Father died at age 47 suddenly presumably of a sudden MI. One sister in good health. One daughter in good health.  Social history: Married with one adult daughter. 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 Nonsmoker. Social alcohol consumption.    Review of Systems  Constitutional: Negative.   Eyes:       Wears glasses  Cardiovascular: Negative for chest pain.  Genitourinary: Negative.   Neurological: Negative.        Complains of some mild numbness in his feet consistent with diabetic peripheral neuropathy  All other systems reviewed and are negative.      Objective:   Physical Exam  Constitutional: He is oriented to person, place, and time. He appears well-developed and well-nourished. No distress.  HENT:    Head: Normocephalic and atraumatic.  Right Ear: External ear normal.  Left Ear: External ear normal.  Mouth/Throat: Oropharynx is clear and moist. No oropharyngeal exudate.  Eyes: Conjunctivae and EOM are normal. Pupils are equal, round, and reactive to light. Right eye exhibits no discharge. No scleral icterus.  Neck: Neck supple. No JVD present. No thyromegaly present.  Cardiovascular: Normal rate, regular rhythm, normal heart sounds and intact distal pulses.   No murmur heard. Pulmonary/Chest: Breath sounds normal. He has no wheezes. He has no rales.  Abdominal: Soft. Bowel sounds are normal. He exhibits no distension. There is no tenderness. There is no rebound and no guarding.  Genitourinary: Prostate normal.  Musculoskeletal: Normal range of motion. He exhibits no edema.  Lymphadenopathy:    He has no cervical adenopathy.  Neurological: He is alert and oriented to person, place, and time. He has normal reflexes. He displays normal reflexes. No cranial nerve deficit. Coordination normal.  Skin: Skin is warm and dry. No rash noted. He is not diaphoretic.  Psychiatric: He has a normal mood and affect. His behavior is normal. Judgment and thought content normal.  Vitals reviewed.         Assessment & Plan:  Controlled type 2 diabetes mellitus-stable and under good control with Januvia and metformin  Obesity-continue diet and exercise efforts  Essential hypertension-stable  Hyperlipidemia-stable on statin medication  Family history of coronary disease  Peripheral neuropathy secondary to diabetes  Metabolic syndrome  Plan: Return in 6 months or as needed. Planning a trip to [place] in the near  future. May need to take antibiotics with him on this trip is he will be in remote locations.

## 2014-12-04 ENCOUNTER — Encounter: Payer: Self-pay | Admitting: Internal Medicine

## 2015-05-06 ENCOUNTER — Other Ambulatory Visit: Payer: Self-pay

## 2015-05-06 MED ORDER — SITAGLIPTIN PHOSPHATE 100 MG PO TABS: 100.0000 mg | ORAL_TABLET | Freq: Every day | ORAL | Status: DC

## 2015-05-06 MED ORDER — ATORVASTATIN CALCIUM 80 MG PO TABS: 80.0000 mg | ORAL_TABLET | Freq: Every day | ORAL | Status: DC

## 2015-05-18 ENCOUNTER — Other Ambulatory Visit: Payer: Managed Care, Other (non HMO) | Admitting: Internal Medicine

## 2015-05-18 DIAGNOSIS — E669 Obesity, unspecified: Secondary | ICD-10-CM

## 2015-05-18 DIAGNOSIS — E785 Hyperlipidemia, unspecified: Secondary | ICD-10-CM

## 2015-05-18 DIAGNOSIS — I10 Essential (primary) hypertension: Secondary | ICD-10-CM

## 2015-05-18 DIAGNOSIS — E119 Type 2 diabetes mellitus without complications: Secondary | ICD-10-CM

## 2015-05-18 DIAGNOSIS — E8881 Metabolic syndrome: Secondary | ICD-10-CM

## 2015-05-18 LAB — HEPATIC FUNCTION PANEL
ALT: 33 U/L (ref 9–46)
AST: 21 U/L (ref 10–35)
Albumin: 4.4 g/dL (ref 3.6–5.1)
Alkaline Phosphatase: 59 U/L (ref 40–115)
BILIRUBIN DIRECT: 0.1 mg/dL (ref <=0.2)
BILIRUBIN INDIRECT: 0.5 mg/dL (ref 0.2–1.2)
Total Bilirubin: 0.6 mg/dL (ref 0.2–1.2)
Total Protein: 7.1 g/dL (ref 6.1–8.1)

## 2015-05-18 LAB — HEMOGLOBIN A1C
Hgb A1c MFr Bld: 6.8 % — ABNORMAL HIGH (ref <5.7)
Mean Plasma Glucose: 148 mg/dL — ABNORMAL HIGH (ref <117)

## 2015-05-18 LAB — LIPID PANEL
Cholesterol: 179 mg/dL (ref 125–200)
HDL: 54 mg/dL (ref >=40)
LDL CALC: 96 mg/dL (ref <130)
Total CHOL/HDL Ratio: 3.3 Ratio (ref <=5.0)
Triglycerides: 144 mg/dL (ref <150)
VLDL: 29 mg/dL (ref <30)

## 2015-05-19 ENCOUNTER — Encounter: Payer: Self-pay | Admitting: Internal Medicine

## 2015-05-19 ENCOUNTER — Ambulatory Visit (INDEPENDENT_AMBULATORY_CARE_PROVIDER_SITE_OTHER): Payer: Managed Care, Other (non HMO) | Admitting: Internal Medicine

## 2015-05-19 VITALS — BP 120/85 | HR 97 | Temp 97.0°F | Resp 20 | Ht 67.0 in | Wt 200.0 lb

## 2015-05-19 DIAGNOSIS — Z23 Encounter for immunization: Secondary | ICD-10-CM

## 2015-05-19 DIAGNOSIS — N400 Enlarged prostate without lower urinary tract symptoms: Secondary | ICD-10-CM | POA: Diagnosis not present

## 2015-05-19 DIAGNOSIS — E119 Type 2 diabetes mellitus without complications: Secondary | ICD-10-CM | POA: Diagnosis not present

## 2015-05-19 DIAGNOSIS — E785 Hyperlipidemia, unspecified: Secondary | ICD-10-CM | POA: Diagnosis not present

## 2015-05-19 MED ORDER — LOSARTAN POTASSIUM 25 MG PO TABS: 25.0000 mg | ORAL_TABLET | Freq: Every day | ORAL | Status: DC

## 2015-05-19 NOTE — Patient Instructions (Signed)
Return for physical exam in 6 months. Start Cozaar 25 mg daily. Continue other medications as previously prescribed. Flu shot given today. It was a pleasure to see you today.

## 2015-05-19 NOTE — Progress Notes (Signed)
   Subjective:    Patient ID: Thomas Washington, male    DOB: 22-Jun-1950, 64 y.o.   MRN: [id number]  HPI He stopped taking Ramipril because it made him feel bad when he was hunting. Particularly in hot weather, he would feel lethargic and tired. Have convinced him to try Cozaar 25 mg daily. Is going to [place] in August 2017. He'll return in June for physical exam. Flu vaccine given today. No new complaints or problems. He injured his right great toe nail bed and nail fell off several months ago on a hunting trip. New nail is beginning to grow back.    Review of Systems     Objective:   Physical Exam Skin warm and dry. Nodes none. No JVD thyromegaly or carotid bruits. Chest clear to auscultation. Cardiac exam regular rate and rhythm. Normal S1 and S2. Extremities without edema.       Assessment & Plan:  Reminded about annual diabetic eye exam   Flu shot given  Start Cozaar 25 mg daily. Discontinue Ramipril due to c/o fatigue.  Monitor BP at home  Return June 2017 for physical examination  Pneumovax 23 and Prevnar 13 up-to-date  Diagnoses: Essential hypertension  Fatigue  Hyperlipidemia  Obesity  Metabolic syndrome  Controlled type 2 diabetes mellitus

## 2015-07-23 ENCOUNTER — Ambulatory Visit (INDEPENDENT_AMBULATORY_CARE_PROVIDER_SITE_OTHER): Payer: Managed Care, Other (non HMO) | Admitting: Internal Medicine

## 2015-07-23 VITALS — BP 118/70

## 2015-07-23 DIAGNOSIS — Z23 Encounter for immunization: Secondary | ICD-10-CM

## 2015-07-23 MED ORDER — HEPATITIS A VACCINE 1440 EL U/ML IM SUSP
1.0000 mL | Freq: Once | INTRAMUSCULAR | Status: AC
  Administered 2015-07-23: 1440 [IU] via INTRAMUSCULAR

## 2015-10-01 ENCOUNTER — Encounter: Payer: Self-pay | Admitting: Internal Medicine

## 2015-10-01 LAB — HM DIABETES EYE EXAM

## 2015-10-05 ENCOUNTER — Telehealth: Payer: Self-pay | Admitting: Internal Medicine

## 2015-10-05 NOTE — Telephone Encounter (Signed)
Mr. Blome called saying he'd received samples of Tamsulosin and has run out. He's never had an actual Rx sent to his pharmacy but he's wondering if one can be sent now. He'd like the Rx sent to [facility]. Please give him a call if needed.  Pt's ph# [phone] Thank you.

## 2015-10-06 MED ORDER — TAMSULOSIN HCL 0.4 MG PO CAPS: 0.4000 mg | ORAL_CAPSULE | Freq: Every day | ORAL | Status: DC

## 2015-10-06 NOTE — Telephone Encounter (Signed)
Pt may have Tamsulosin 0.4 mg #30 with 5 refills to [facility]

## 2015-10-06 NOTE — Telephone Encounter (Signed)
This has been sent to pharmacy and patient has been made aware.

## 2015-10-06 NOTE — Telephone Encounter (Signed)
Can we refill this medication? We have never done so it doesn't look like.

## 2016-02-03 ENCOUNTER — Encounter: Payer: Self-pay | Admitting: Internal Medicine

## 2016-02-03 ENCOUNTER — Telehealth: Payer: Self-pay | Admitting: Internal Medicine

## 2016-02-03 MED ORDER — METFORMIN HCL 1000 MG PO TABS: 1000.0000 mg | ORAL_TABLET | Freq: Two times a day (BID) | ORAL | 1 refills | Status: DC

## 2016-02-03 NOTE — Telephone Encounter (Signed)
Refill metformin 1000 mg bid to [place] at patient request.

## 2016-03-08 ENCOUNTER — Other Ambulatory Visit: Payer: Managed Care, Other (non HMO) | Admitting: Internal Medicine

## 2016-03-08 DIAGNOSIS — E139 Other specified diabetes mellitus without complications: Secondary | ICD-10-CM

## 2016-03-09 LAB — HEMOGLOBIN A1C
HEMOGLOBIN A1C: 6.5 % — AB (ref <5.7)
MEAN PLASMA GLUCOSE: 140 mg/dL

## 2016-03-11 ENCOUNTER — Encounter: Payer: Self-pay | Admitting: Internal Medicine

## 2016-03-11 ENCOUNTER — Ambulatory Visit (INDEPENDENT_AMBULATORY_CARE_PROVIDER_SITE_OTHER): Payer: Managed Care, Other (non HMO) | Admitting: Internal Medicine

## 2016-03-11 VITALS — BP 120/82 | HR 67 | Temp 97.1°F | Wt 199.5 lb

## 2016-03-11 DIAGNOSIS — E119 Type 2 diabetes mellitus without complications: Secondary | ICD-10-CM

## 2016-03-11 NOTE — Patient Instructions (Addendum)
Continue 1000 mg of metformin twice daily as well as Januvia. Return October 24 for follow-up hemoglobin A1c. Physical exam booked December.

## 2016-03-11 NOTE — Progress Notes (Signed)
   Subjective:    Patient ID: Thomas Washington, male    DOB: [birth year], 65 y.o.   MRN: [id number]  HPI Patient here because he thought his diabetes was not being well controlled recently. He reminded me that he had been taking metformin 500 mg twice daily. He called regarding a prescription recently and I thought he was taking 1000 mg twice daily. [facility]. He recently had hemoglobin A1c which is excellent at 6.5%. 9 months ago was 6.8%.. Says fasting glucose is been around 140. He was surprised  to learn his glucose control was as good as it was.  Since he's only been taking the increased dose of metformin for short time, we have agreed that he'll come back in a few weeks for another hemoglobin A1c. Otherwise he's doing well with no new complaints.    Review of Systems as above     Objective:   Physical Exam   Not examined. Just 15 minutes speaking with him today about this issue      Assessment & Plan:  Controlled type 2 diabetes mellitus  Plan: Continue 1000 mg metformin twice daily in addition to Januvia. Follow-up October 24 for him 11 A1c only. Physical exam booked for December.

## 2016-03-12 LAB — MICROALBUMIN, URINE: MICROALB UR: 3.2 mg/dL

## 2016-04-05 ENCOUNTER — Other Ambulatory Visit: Payer: Managed Care, Other (non HMO) | Admitting: Internal Medicine

## 2016-04-05 DIAGNOSIS — E138 Other specified diabetes mellitus with unspecified complications: Secondary | ICD-10-CM

## 2016-04-06 ENCOUNTER — Telehealth: Payer: Self-pay | Admitting: Internal Medicine

## 2016-04-06 ENCOUNTER — Encounter: Payer: Self-pay | Admitting: Internal Medicine

## 2016-04-06 LAB — HEMOGLOBIN A1C
Hgb A1c MFr Bld: 6.3 % — ABNORMAL HIGH (ref <5.7)
Mean Plasma Glucose: 134 mg/dL

## 2016-04-06 MED ORDER — SITAGLIPTIN PHOSPHATE 100 MG PO TABS: 100.0000 mg | ORAL_TABLET | Freq: Every day | ORAL | 3 refills | Status: DC

## 2016-04-06 NOTE — Telephone Encounter (Signed)
Refill Januvia 100 mg when necessary [facility]

## 2016-05-02 ENCOUNTER — Other Ambulatory Visit: Payer: Self-pay | Admitting: Internal Medicine

## 2016-05-02 MED ORDER — METFORMIN HCL 1000 MG PO TABS: 1000.0000 mg | ORAL_TABLET | Freq: Two times a day (BID) | ORAL | 2 refills | Status: DC

## 2016-05-02 MED ORDER — ATORVASTATIN CALCIUM 80 MG PO TABS: 80.0000 mg | ORAL_TABLET | Freq: Every day | ORAL | 3 refills | Status: DC

## 2016-05-23 ENCOUNTER — Other Ambulatory Visit: Payer: Managed Care, Other (non HMO) | Admitting: Internal Medicine

## 2016-05-23 DIAGNOSIS — Z0000 Encounter for general adult medical examination without abnormal findings: Secondary | ICD-10-CM

## 2016-05-23 DIAGNOSIS — I10 Essential (primary) hypertension: Secondary | ICD-10-CM

## 2016-05-23 DIAGNOSIS — E785 Hyperlipidemia, unspecified: Secondary | ICD-10-CM

## 2016-05-23 DIAGNOSIS — E138 Other specified diabetes mellitus with unspecified complications: Secondary | ICD-10-CM

## 2016-05-23 LAB — CBC WITH DIFFERENTIAL/PLATELET
BASOS PCT: 1 %
Basophils Absolute: 80 cells/uL (ref 0–200)
EOS ABS: 160 {cells}/uL (ref 15–500)
Eosinophils Relative: 2 %
HEMATOCRIT: 47.1 % (ref 38.5–50.0)
Hemoglobin: 15.8 g/dL (ref 13.2–17.1)
LYMPHS PCT: 36 %
Lymphs Abs: 2880 cells/uL (ref 850–3900)
MCH: 30.2 pg (ref 27.0–33.0)
MCHC: 33.5 g/dL (ref 32.0–36.0)
MCV: 89.9 fL (ref 80.0–100.0)
MONO ABS: 720 {cells}/uL (ref 200–950)
MPV: 9.2 fL (ref 7.5–12.5)
Monocytes Relative: 9 %
NEUTROS ABS: 4160 {cells}/uL (ref 1500–7800)
Neutrophils Relative %: 52 %
Platelets: 238 10*3/uL (ref 140–400)
RBC: 5.24 MIL/uL (ref 4.20–5.80)
RDW: 14.6 % (ref 11.0–15.0)
WBC: 8 10*3/uL (ref 3.8–10.8)

## 2016-05-23 LAB — HEMOGLOBIN A1C
Hgb A1c MFr Bld: 6.2 % — ABNORMAL HIGH (ref <5.7)
Mean Plasma Glucose: 131 mg/dL

## 2016-05-23 LAB — LIPID PANEL
CHOLESTEROL: 161 mg/dL (ref <200)
HDL: 51 mg/dL (ref >40)
LDL CALC: 88 mg/dL (ref <100)
TRIGLYCERIDES: 111 mg/dL (ref <150)
Total CHOL/HDL Ratio: 3.2 Ratio (ref <5.0)
VLDL: 22 mg/dL (ref <30)

## 2016-05-23 LAB — COMPLETE METABOLIC PANEL WITH GFR
ALBUMIN: 4.7 g/dL (ref 3.6–5.1)
ALT: 37 U/L (ref 9–46)
AST: 27 U/L (ref 10–35)
Alkaline Phosphatase: 57 U/L (ref 40–115)
BILIRUBIN TOTAL: 0.8 mg/dL (ref 0.2–1.2)
BUN: 16 mg/dL (ref 7–25)
CALCIUM: 9.8 mg/dL (ref 8.6–10.3)
CO2: 29 mmol/L (ref 20–31)
CREATININE: 0.95 mg/dL (ref 0.70–1.25)
Chloride: 102 mmol/L (ref 98–110)
GFR, Est Non African American: 85 mL/min (ref >=60)
Glucose, Bld: 138 mg/dL — ABNORMAL HIGH (ref 65–99)
Potassium: 4.6 mmol/L (ref 3.5–5.3)
Sodium: 141 mmol/L (ref 135–146)
TOTAL PROTEIN: 7.4 g/dL (ref 6.1–8.1)

## 2016-05-23 LAB — PSA: PSA: 1.1 ng/mL (ref <=4.0)

## 2016-05-27 ENCOUNTER — Encounter: Payer: Self-pay | Admitting: Internal Medicine

## 2016-05-27 ENCOUNTER — Ambulatory Visit (INDEPENDENT_AMBULATORY_CARE_PROVIDER_SITE_OTHER): Payer: Managed Care, Other (non HMO) | Admitting: Internal Medicine

## 2016-05-27 VITALS — BP 118/84 | HR 77 | Temp 97.7°F | Ht 67.0 in | Wt 200.0 lb

## 2016-05-27 DIAGNOSIS — Z0000 Encounter for general adult medical examination without abnormal findings: Secondary | ICD-10-CM

## 2016-05-27 DIAGNOSIS — N401 Enlarged prostate with lower urinary tract symptoms: Secondary | ICD-10-CM | POA: Diagnosis not present

## 2016-05-27 DIAGNOSIS — R351 Nocturia: Secondary | ICD-10-CM

## 2016-05-27 DIAGNOSIS — E7849 Other hyperlipidemia: Secondary | ICD-10-CM

## 2016-05-27 DIAGNOSIS — E119 Type 2 diabetes mellitus without complications: Secondary | ICD-10-CM | POA: Diagnosis not present

## 2016-05-27 DIAGNOSIS — Z6831 Body mass index (BMI) 31.0-31.9, adult: Secondary | ICD-10-CM

## 2016-05-27 DIAGNOSIS — E8881 Metabolic syndrome: Secondary | ICD-10-CM | POA: Diagnosis not present

## 2016-05-27 DIAGNOSIS — E784 Other hyperlipidemia: Secondary | ICD-10-CM | POA: Diagnosis not present

## 2016-05-27 DIAGNOSIS — I10 Essential (primary) hypertension: Secondary | ICD-10-CM

## 2016-05-27 LAB — POCT URINALYSIS DIPSTICK
BILIRUBIN UA: NEGATIVE
Blood, UA: NEGATIVE
GLUCOSE UA: NEGATIVE
Ketones, UA: NEGATIVE
LEUKOCYTES UA: NEGATIVE
NITRITE UA: NEGATIVE
Protein, UA: NEGATIVE
Spec Grav, UA: 1.01
Urobilinogen, UA: NEGATIVE
pH, UA: 6.5

## 2016-05-27 NOTE — Progress Notes (Signed)
   Subjective:    Patient ID: Thomas Washington, male    DOB: [birth year], 65 y.o.   MRN: [id number]  HPI  65 year old White Male for health maintenance exam and evaluation of medical issues. He has a history of controlled type 2 diabetes mellitus, essential hypertension, obesity, hyperlipidemia, metabolic syndrome. Diabetes is controlled with metformin and Januvia. He takes statin medication for hyperlipidemia along with coenzyme Q.  No longer taking losartan. Was on 25 mg daily. Does take 81 mg aspirin daily. Takes Flomax for BPH symptoms.  No known drug allergies  Weight is 200 pounds and was 199 in 2016.  Colonoscopy done July 2006 and is due again.  History of Candida esophagitis 1991. History of Schatzki's ring 1991.  Had acute  pericarditis  2014. Was seen by [name], Cardiologist.  Family history: Mother with history of stroke died of complications of dementia and stroke. Father died at age 7 suddenly presumably of a sudden MI. One sister in good health.  Social history: Married with one adult daughter. He is a Software engineer. Carlos Levering. Social alcohol consumption.  Sees Dr. Delman Cheadle for eye exam.  Had negative exercise tolerance test in 1995 by Dr. Aldona Bar.        Review of Systems  Constitutional: Negative.   Respiratory: Negative.   Cardiovascular: Negative for chest pain and palpitations.  Genitourinary:       Nocturia  Neurological: Negative.   Psychiatric/Behavioral: Negative.        Objective:   Physical Exam  Constitutional: He is oriented to person, place, and time. He appears well-developed and well-nourished. No distress.  HENT:  Head: Normocephalic and atraumatic.  Right Ear: External ear normal.  Left Ear: External ear normal.  Mouth/Throat: Oropharynx is clear and moist. No oropharyngeal exudate.  Eyes: Conjunctivae and EOM are normal. Pupils are equal, round, and reactive to light. Right eye exhibits no discharge. Left eye exhibits no discharge. No  scleral icterus.  Neck: Neck supple. No JVD present. No thyromegaly present.  Cardiovascular: Normal rate, regular rhythm, normal heart sounds and intact distal pulses.   No murmur heard. Pulmonary/Chest: Effort normal and breath sounds normal. No respiratory distress. He has no wheezes. He has no rales.  Abdominal: He exhibits no distension and no mass. There is no tenderness. There is no rebound and no guarding.  Genitourinary:  Genitourinary Comments: Prostate slightly enlarged and symmetrical without nodules  Musculoskeletal: He exhibits no edema.  Lymphadenopathy:    He has no cervical adenopathy.  Neurological: He is alert and oriented to person, place, and time. He has normal reflexes. No cranial nerve deficit. Coordination normal.  Skin: Skin is warm and dry. No rash noted. He is not diaphoretic.  Psychiatric: He has a normal mood and affect. His behavior is normal. Judgment and thought content normal.  Vitals reviewed.         Assessment & Plan:  Controlled type 2 diabetes mellitus  Hyperlipidemia  Essential hypertension  Metabolic syndrome  Obesity  BPH  Plan: Fasting serum glucose 138. Hemoglobin A1c excellent at 6.2%. Lipid panel liver functions within normal limits. Urinalysis without glucose. Recommend annual diabetic eye exam. Recommend annual flu vaccine. Return in 6 months.

## 2016-06-11 NOTE — Patient Instructions (Signed)
It was a pleasure to see you today. Continue same medications and return in 6 months. Need to consider repeat colonoscopy.

## 2016-09-06 LAB — HM DIABETES EYE EXAM

## 2016-09-08 ENCOUNTER — Encounter: Payer: Self-pay | Admitting: Internal Medicine

## 2016-10-10 ENCOUNTER — Other Ambulatory Visit: Payer: Self-pay

## 2016-10-10 MED ORDER — METFORMIN HCL 1000 MG PO TABS: 1000.0000 mg | ORAL_TABLET | Freq: Two times a day (BID) | ORAL | 3 refills | Status: DC

## 2016-11-22 ENCOUNTER — Other Ambulatory Visit: Payer: Managed Care, Other (non HMO) | Admitting: Internal Medicine

## 2016-11-22 DIAGNOSIS — E138 Other specified diabetes mellitus with unspecified complications: Secondary | ICD-10-CM

## 2016-11-22 DIAGNOSIS — E785 Hyperlipidemia, unspecified: Secondary | ICD-10-CM

## 2016-11-22 LAB — HEPATIC FUNCTION PANEL
ALT: 28 U/L (ref 9–46)
AST: 18 U/L (ref 10–35)
Albumin: 4.2 g/dL (ref 3.6–5.1)
Alkaline Phosphatase: 58 U/L (ref 40–115)
BILIRUBIN INDIRECT: 0.4 mg/dL (ref 0.2–1.2)
Bilirubin, Direct: 0.1 mg/dL (ref <=0.2)
Total Bilirubin: 0.5 mg/dL (ref 0.2–1.2)
Total Protein: 7.4 g/dL (ref 6.1–8.1)

## 2016-11-22 LAB — LIPID PANEL
CHOLESTEROL: 169 mg/dL (ref <200)
HDL: 53 mg/dL (ref >40)
LDL CALC: 95 mg/dL (ref <100)
TRIGLYCERIDES: 107 mg/dL (ref <150)
Total CHOL/HDL Ratio: 3.2 Ratio (ref <5.0)
VLDL: 21 mg/dL (ref <30)

## 2016-11-23 LAB — HEMOGLOBIN A1C
HEMOGLOBIN A1C: 6.6 % — AB (ref <5.7)
MEAN PLASMA GLUCOSE: 143 mg/dL

## 2016-11-24 ENCOUNTER — Ambulatory Visit (INDEPENDENT_AMBULATORY_CARE_PROVIDER_SITE_OTHER): Payer: Managed Care, Other (non HMO) | Admitting: Internal Medicine

## 2016-11-24 ENCOUNTER — Encounter: Payer: Self-pay | Admitting: Internal Medicine

## 2016-11-24 VITALS — BP 134/80 | HR 68 | Temp 97.6°F | Wt 199.0 lb

## 2016-11-24 DIAGNOSIS — E8881 Metabolic syndrome: Secondary | ICD-10-CM

## 2016-11-24 DIAGNOSIS — I10 Essential (primary) hypertension: Secondary | ICD-10-CM

## 2016-11-24 DIAGNOSIS — E119 Type 2 diabetes mellitus without complications: Secondary | ICD-10-CM

## 2016-11-24 DIAGNOSIS — E7849 Other hyperlipidemia: Secondary | ICD-10-CM

## 2016-11-24 DIAGNOSIS — Z1211 Encounter for screening for malignant neoplasm of colon: Secondary | ICD-10-CM

## 2016-11-24 DIAGNOSIS — E784 Other hyperlipidemia: Secondary | ICD-10-CM | POA: Diagnosis not present

## 2016-11-24 DIAGNOSIS — Z6831 Body mass index (BMI) 31.0-31.9, adult: Secondary | ICD-10-CM | POA: Diagnosis not present

## 2016-11-24 MED ORDER — MUPIROCIN 2 % EX OINT: 1.0000 "application " | TOPICAL_OINTMENT | Freq: Two times a day (BID) | CUTANEOUS | 0 refills | Status: DC

## 2016-11-24 NOTE — Progress Notes (Signed)
   Subjective:    Patient ID: Thomas Washington, male    DOB: [birth year], 66 y.o.   MRN: [id number]  HPI 66 year old Male in today for 6 month follow-up on hypertension, hyperlipidemia, and impaired glucose tolerance. Went to [place] recently. Recently had infection of right foot dorsal aspect which he treated with doxycycline. It has healed.  Reminded about weight loss ,diet, and exercise.  Blood pressure under good control on current regimen.  Have refilled doxycycline and also prescribed bactroban to use in nose.    Review of Systems see above     Objective:   Physical Exam  Constitutional: He appears well-developed and well-nourished.  HENT:  Head: Normocephalic and atraumatic.  Right Ear: External ear normal.  Left Ear: External ear normal.  Eyes: Right eye exhibits no discharge. Left eye exhibits no discharge.  Neck: Neck supple. No JVD present. No thyromegaly present.  Cardiovascular: Normal rate, regular rhythm, normal heart sounds and intact distal pulses.   No murmur heard. Pulmonary/Chest: Effort normal and breath sounds normal. No respiratory distress. He has no wheezes.  Musculoskeletal: He exhibits no edema.  Lymphadenopathy:    He has no cervical adenopathy.  Skin: Skin is warm and dry.  Psychiatric: He has a normal mood and affect. His behavior is normal. Judgment and thought content normal.  Vitals reviewed.         Assessment & Plan:  Controlled type 2 diabetes mellitus  Essential hypertension  Hyperlipidemia  Metabolic syndrome  Recent infection right foot-healed  Plan: Given refill on doxycycline and Bactroban ointment. He is to apply the ointment in his nostrils at bedtime for several days in case he is a MRSA carrier. He also may apply the ointment to further infections.  Encouraged diet exercise and weight loss. Follow-up in 6 months. Time for repeat colonoscopy. Last one was done at [facility]. Order placed.

## 2016-11-24 NOTE — Patient Instructions (Addendum)
It was a pleasure to see you today. Doxycycline prescribed for recurrent MRSA infection. Bactroban prescribed him put in nostrils to prevent staph infections. Return in 6 months for physical exam. Need for  colonoscopy discussed.

## 2016-12-28 ENCOUNTER — Telehealth: Payer: Self-pay

## 2016-12-28 MED ORDER — SITAGLIPTIN PHOSPHATE 100 MG PO TABS: 100.0000 mg | ORAL_TABLET | Freq: Every day | ORAL | 3 refills | Status: DC

## 2016-12-28 MED ORDER — ATORVASTATIN CALCIUM 80 MG PO TABS: 80.0000 mg | ORAL_TABLET | Freq: Every day | ORAL | 3 refills | Status: DC

## 2016-12-28 NOTE — Telephone Encounter (Signed)
Received fax from [place] in regards to a refill on Atorvastatin 80 mg and Januvia 100mg  for patient. Medication was refilled per Dr. Verlene Mayer request. Sent a year supply

## 2017-01-13 ENCOUNTER — Encounter: Payer: Self-pay | Admitting: Internal Medicine

## 2017-03-08 ENCOUNTER — Telehealth: Payer: Self-pay

## 2017-03-08 MED ORDER — METFORMIN HCL 1000 MG PO TABS: 1000.0000 mg | ORAL_TABLET | Freq: Two times a day (BID) | ORAL | 3 refills | Status: DC

## 2017-03-08 NOTE — Telephone Encounter (Signed)
Received fax from [place] in regards to a refill on Metformin 1000mg  for patient. Medication was refilled per Dr. Verlene Mayer request. Sent 1 year

## 2017-03-15 ENCOUNTER — Ambulatory Visit (AMBULATORY_SURGERY_CENTER): Payer: Self-pay

## 2017-03-15 VITALS — Ht 67.5 in | Wt 197.0 lb

## 2017-03-15 DIAGNOSIS — Z1211 Encounter for screening for malignant neoplasm of colon: Secondary | ICD-10-CM

## 2017-03-15 MED ORDER — SUPREP BOWEL PREP KIT 17.5-3.13-1.6 GM/177ML PO SOLN: 1.0000 | Freq: Once | ORAL | 0 refills | Status: AC

## 2017-03-15 NOTE — Progress Notes (Signed)
No allergies to eggs or soy No past problems with anesthesia No home oxygen No diet meds  Declined emmi 

## 2017-03-29 ENCOUNTER — Ambulatory Visit (AMBULATORY_SURGERY_CENTER): Payer: 59 | Admitting: Internal Medicine

## 2017-03-29 ENCOUNTER — Encounter: Payer: Self-pay | Admitting: Internal Medicine

## 2017-03-29 VITALS — BP 136/88 | HR 61 | Temp 96.4°F | Resp 14 | Ht 67.5 in | Wt 197.0 lb

## 2017-03-29 DIAGNOSIS — D120 Benign neoplasm of cecum: Secondary | ICD-10-CM | POA: Diagnosis not present

## 2017-03-29 DIAGNOSIS — Z1211 Encounter for screening for malignant neoplasm of colon: Secondary | ICD-10-CM

## 2017-03-29 DIAGNOSIS — D123 Benign neoplasm of transverse colon: Secondary | ICD-10-CM | POA: Diagnosis not present

## 2017-03-29 HISTORY — PX: COLONOSCOPY: SHX174

## 2017-03-29 MED ORDER — SODIUM CHLORIDE 0.9 % IV SOLN: 500.0000 mL | INTRAVENOUS | Status: DC

## 2017-03-29 NOTE — Progress Notes (Signed)
No problems noted in the recovery room. maw 

## 2017-03-29 NOTE — Progress Notes (Signed)
Pt's states no medical or surgical changes since previsit or office visit. 

## 2017-03-29 NOTE — Addendum Note (Signed)
Addended by: Evonnie Pat A on: 03/29/2017 10:09 AM   Modules accepted: Orders

## 2017-03-29 NOTE — Progress Notes (Signed)
To PACU, VSS. Report to [email or web address] 

## 2017-03-29 NOTE — Op Note (Signed)
[place] Patient Name: Thomas Washington Procedure Date: 03/29/2017 7:54 AM MRN: [id number] Endoscopist: Docia Chuck. [name] , MD Age: 66 Referring MD:  Date of Birth: Jan 06, 1951 Gender: Male Account #: [id number] Procedure:                Colonoscopy, with cold snare polypectomy x 3 Indications:              Screening for colorectal malignant neoplasm.                            Negative index exam 2006 Medicines:                Monitored Anesthesia Care Procedure:                Pre-Anesthesia Assessment:                           - Prior to the procedure, a History and Physical                            was performed, and patient medications and                            allergies were reviewed. The patient's tolerance of                            previous anesthesia was also reviewed. The risks                            and benefits of the procedure and the sedation                            options and risks were discussed with the patient.                            All questions were answered, and informed consent                            was obtained. Prior Anticoagulants: The patient has                            taken no previous anticoagulant or antiplatelet                            agents. After reviewing the risks and benefits, the                            patient was deemed in satisfactory condition to                            undergo the procedure.                           After obtaining informed consent, the colonoscope  was passed under direct vision. Throughout the                            procedure, the patient's blood pressure, pulse, and                            oxygen saturations were monitored continuously. The                            Colonoscope was introduced through the anus and                            advanced to the the cecum, identified by                            appendiceal orifice and ileocecal  valve. The                            ileocecal valve, appendiceal orifice, and rectum                            were photographed. The quality of the bowel                            preparation was excellent. The colonoscopy was                            performed without difficulty. The patient tolerated                            the procedure well. The bowel preparation used was                            SUPREP. Scope In: 8:29:52 AM Scope Out: 8:46:01 AM Scope Withdrawal Time: 0 hours 14 minutes 11 seconds  Total Procedure Duration: 0 hours 16 minutes 9 seconds  Findings:                 Three polyps were found in the proximal transverse                            colon and cecum. The polyps were 1 to 3 mm in size.                            These polyps were removed with a cold snare.                            Resection and retrieval were complete.                           A few small-mouthed diverticula were found in the                            sigmoid colon.  The exam was otherwise without abnormality on                            direct and retroflexion views. Internal hemorrhoids                            present. Complications:            No immediate complications. Estimated blood loss:                            None. Estimated Blood Loss:     Estimated blood loss: none. Impression:               - Three 1 to 3 mm polyps in the proximal transverse                            colon and in the cecum, removed with a cold snare.                            Resected and retrieved.                           - Diverticulosis in the sigmoid colon.                           - The examination was otherwise normal on direct                            and retroflexion views. Recommendation:           - Repeat colonoscopy in 5-10 years for surveillance.                           - Patient has a contact number available for                             emergencies. The signs and symptoms of potential                            delayed complications were discussed with the                            patient. Return to normal activities tomorrow.                            Written discharge instructions were provided to the                            patient.                           - Resume previous diet.                           - Continue present medications.                           -  Await pathology results. Docia Chuck. [name], MD 03/29/2017 8:54:37 AM This report has been signed electronically.

## 2017-03-29 NOTE — Patient Instructions (Signed)
YOU HAD AN ENDOSCOPIC PROCEDURE TODAY AT THE [facility]:   Refer to the procedure report that was given to you for any specific questions about what was found during the examination.  If the procedure report does not answer your questions, please call your gastroenterologist to clarify.  If you requested that your care partner not be given the details of your procedure findings, then the procedure report has been included in a sealed envelope for you to review at your convenience later.  YOU SHOULD EXPECT: Some feelings of bloating in the abdomen. Passage of more gas than usual.  Walking can help get rid of the air that was put into your GI tract during the procedure and reduce the bloating. If you had a lower endoscopy (such as a colonoscopy or flexible sigmoidoscopy) you may notice spotting of blood in your stool or on the toilet paper. If you underwent a bowel prep for your procedure, you may not have a normal bowel movement for a few days.  Please Note:  You might notice some irritation and congestion in your nose or some drainage.  This is from the oxygen used during your procedure.  There is no need for concern and it should clear up in a day or so.  SYMPTOMS TO REPORT IMMEDIATELY:   Following lower endoscopy (colonoscopy or flexible sigmoidoscopy):  Excessive amounts of blood in the stool  Significant tenderness or worsening of abdominal pains  Swelling of the abdomen that is new, acute  Fever of 100F or higher   For urgent or emergent issues, a gastroenterologist can be reached at any hour by calling [phone].   DIET:  We do recommend a small meal at first, but then you may proceed to your regular diet.  Drink plenty of fluids but you should avoid alcoholic beverages for 24 hours.  ACTIVITY:  You should plan to take it easy for the rest of today and you should NOT DRIVE or use heavy machinery until tomorrow (because of the sedation medicines used during the test).     FOLLOW UP: Our staff will call the number listed on your records the next business day following your procedure to check on you and address any questions or concerns that you may have regarding the information given to you following your procedure. If we do not reach you, we will leave a message.  However, if you are feeling well and you are not experiencing any problems, there is no need to return our call.  We will assume that you have returned to your regular daily activities without incident.  If any biopsies were taken you will be contacted by phone or by letter within the next 1-3 weeks.  Please call us at [phone] if you have not heard about the biopsies in 3 weeks.    SIGNATURES/CONFIDENTIALITY: You and/or your care partner have signed paperwork which will be entered into your electronic medical record.  These signatures attest to the fact that that the information above on your After Visit Summary has been reviewed and is understood.  Full responsibility of the confidentiality of this discharge information lies with you and/or your care-partner.   Handouts were given to your care partner on polyps, diverticulosis, and hemorrhoids. You may resume your current medications today. Await biopsy results. Please call if any questions or concerns.   

## 2017-03-29 NOTE — Progress Notes (Signed)
Called to room to assist during endoscopic procedure.  Patient ID and intended procedure confirmed with present staff. Received instructions for my participation in the procedure from the performing physician.  

## 2017-03-30 ENCOUNTER — Telehealth: Payer: Self-pay

## 2017-03-30 NOTE — Telephone Encounter (Signed)
  Follow up Call-  Call back number 03/29/2017  Post procedure Call Back phone  # [phone]  Permission to leave phone message Yes  Some recent data might be hidden     Patient questions:  Do you have a fever, pain , or abdominal swelling? No. Pain Score  0 *  Have you tolerated food without any problems? Yes.    Have you been able to return to your normal activities? Yes.    Do you have any questions about your discharge instructions: Diet   No. Medications  No. Follow up visit  No.  Do you have questions or concerns about your Care? No.  Actions: * If pain score is 4 or above: No action needed, pain <4.  No problems noted per pt. maw

## 2017-04-04 ENCOUNTER — Encounter: Payer: Self-pay | Admitting: Internal Medicine

## 2017-06-07 ENCOUNTER — Other Ambulatory Visit: Payer: Self-pay | Admitting: Internal Medicine

## 2017-06-07 DIAGNOSIS — Z0000 Encounter for general adult medical examination without abnormal findings: Secondary | ICD-10-CM

## 2017-06-07 DIAGNOSIS — I10 Essential (primary) hypertension: Secondary | ICD-10-CM

## 2017-06-07 DIAGNOSIS — Z125 Encounter for screening for malignant neoplasm of prostate: Secondary | ICD-10-CM

## 2017-06-07 DIAGNOSIS — E785 Hyperlipidemia, unspecified: Secondary | ICD-10-CM

## 2017-06-07 DIAGNOSIS — E118 Type 2 diabetes mellitus with unspecified complications: Secondary | ICD-10-CM

## 2017-06-20 ENCOUNTER — Other Ambulatory Visit: Payer: 59 | Admitting: Internal Medicine

## 2017-06-20 DIAGNOSIS — E118 Type 2 diabetes mellitus with unspecified complications: Secondary | ICD-10-CM

## 2017-06-20 DIAGNOSIS — Z0000 Encounter for general adult medical examination without abnormal findings: Secondary | ICD-10-CM

## 2017-06-20 DIAGNOSIS — Z125 Encounter for screening for malignant neoplasm of prostate: Secondary | ICD-10-CM

## 2017-06-20 DIAGNOSIS — E785 Hyperlipidemia, unspecified: Secondary | ICD-10-CM

## 2017-06-20 DIAGNOSIS — I10 Essential (primary) hypertension: Secondary | ICD-10-CM

## 2017-06-21 LAB — LIPID PANEL
Cholesterol: 171 mg/dL (ref <200)
HDL: 53 mg/dL (ref >40)
LDL Cholesterol (Calc): 96 mg/dL (calc)
NON-HDL CHOLESTEROL (CALC): 118 mg/dL (ref <130)
TRIGLYCERIDES: 120 mg/dL (ref <150)
Total CHOL/HDL Ratio: 3.2 (calc) (ref <5.0)

## 2017-06-21 LAB — COMPLETE METABOLIC PANEL WITH GFR
AG RATIO: 1.6 (calc) (ref 1.0–2.5)
ALT: 24 U/L (ref 9–46)
AST: 17 U/L (ref 10–35)
Albumin: 4.6 g/dL (ref 3.6–5.1)
Alkaline phosphatase (APISO): 62 U/L (ref 40–115)
BUN: 17 mg/dL (ref 7–25)
CALCIUM: 9.6 mg/dL (ref 8.6–10.3)
CO2: 25 mmol/L (ref 20–32)
Chloride: 102 mmol/L (ref 98–110)
Creat: 1.04 mg/dL (ref 0.70–1.25)
GFR, EST AFRICAN AMERICAN: 88 mL/min/{1.73_m2} (ref >=60)
GFR, EST NON AFRICAN AMERICAN: 76 mL/min/{1.73_m2} (ref >=60)
Globulin: 2.9 g/dL (calc) (ref 1.9–3.7)
Glucose, Bld: 139 mg/dL — ABNORMAL HIGH (ref 65–99)
Potassium: 4.4 mmol/L (ref 3.5–5.3)
Sodium: 139 mmol/L (ref 135–146)
TOTAL PROTEIN: 7.5 g/dL (ref 6.1–8.1)
Total Bilirubin: 0.7 mg/dL (ref 0.2–1.2)

## 2017-06-21 LAB — MICROALBUMIN / CREATININE URINE RATIO
CREATININE, URINE: 148 mg/dL (ref 20–320)
Microalb Creat Ratio: 10 mcg/mg creat (ref <30)
Microalb, Ur: 1.5 mg/dL

## 2017-06-21 LAB — CBC WITH DIFFERENTIAL/PLATELET
BASOS ABS: 79 {cells}/uL (ref 0–200)
BASOS PCT: 1 %
EOS ABS: 190 {cells}/uL (ref 15–500)
Eosinophils Relative: 2.4 %
HCT: 47 % (ref 38.5–50.0)
HEMOGLOBIN: 16 g/dL (ref 13.2–17.1)
LYMPHS ABS: 2583 {cells}/uL (ref 850–3900)
MCH: 29.3 pg (ref 27.0–33.0)
MCHC: 34 g/dL (ref 32.0–36.0)
MCV: 85.9 fL (ref 80.0–100.0)
MPV: 9.6 fL (ref 7.5–12.5)
Monocytes Relative: 8.7 %
NEUTROS ABS: 4361 {cells}/uL (ref 1500–7800)
Neutrophils Relative %: 55.2 %
Platelets: 267 10*3/uL (ref 140–400)
RBC: 5.47 10*6/uL (ref 4.20–5.80)
RDW: 13.7 % (ref 11.0–15.0)
Total Lymphocyte: 32.7 %
WBC mixed population: 687 cells/uL (ref 200–950)
WBC: 7.9 10*3/uL (ref 3.8–10.8)

## 2017-06-21 LAB — HEMOGLOBIN A1C
HEMOGLOBIN A1C: 6.6 %{Hb} — AB (ref <5.7)
MEAN PLASMA GLUCOSE: 143 (calc)
eAG (mmol/L): 7.9 (calc)

## 2017-06-21 LAB — PSA: PSA: 1.4 ng/mL (ref <=4.0)

## 2017-06-22 ENCOUNTER — Encounter: Payer: Self-pay | Admitting: Internal Medicine

## 2017-06-22 ENCOUNTER — Ambulatory Visit (INDEPENDENT_AMBULATORY_CARE_PROVIDER_SITE_OTHER): Payer: 59 | Admitting: Internal Medicine

## 2017-06-22 VITALS — BP 122/82 | HR 86 | Temp 97.6°F | Ht 67.5 in | Wt 198.6 lb

## 2017-06-22 DIAGNOSIS — E1142 Type 2 diabetes mellitus with diabetic polyneuropathy: Secondary | ICD-10-CM

## 2017-06-22 DIAGNOSIS — E7849 Other hyperlipidemia: Secondary | ICD-10-CM

## 2017-06-22 DIAGNOSIS — I10 Essential (primary) hypertension: Secondary | ICD-10-CM

## 2017-06-22 DIAGNOSIS — E119 Type 2 diabetes mellitus without complications: Secondary | ICD-10-CM

## 2017-06-22 DIAGNOSIS — Z6830 Body mass index (BMI) 30.0-30.9, adult: Secondary | ICD-10-CM

## 2017-06-22 DIAGNOSIS — N401 Enlarged prostate with lower urinary tract symptoms: Secondary | ICD-10-CM | POA: Diagnosis not present

## 2017-06-22 DIAGNOSIS — E8881 Metabolic syndrome: Secondary | ICD-10-CM

## 2017-06-22 DIAGNOSIS — R351 Nocturia: Secondary | ICD-10-CM | POA: Diagnosis not present

## 2017-06-22 DIAGNOSIS — Z0000 Encounter for general adult medical examination without abnormal findings: Secondary | ICD-10-CM

## 2017-06-22 LAB — POCT URINALYSIS DIPSTICK
Appearance: NORMAL
BILIRUBIN UA: NEGATIVE
GLUCOSE UA: NEGATIVE
Ketones, UA: NEGATIVE
Leukocytes, UA: NEGATIVE
Nitrite, UA: NEGATIVE
Odor: NORMAL
Protein, UA: NEGATIVE
RBC UA: NEGATIVE
SPEC GRAV UA: 1.02 (ref 1.010–1.025)
Urobilinogen, UA: 0.2 E.U./dL
pH, UA: 6 (ref 5.0–8.0)

## 2017-07-09 DIAGNOSIS — E1142 Type 2 diabetes mellitus with diabetic polyneuropathy: Secondary | ICD-10-CM | POA: Insufficient documentation

## 2017-07-09 NOTE — Progress Notes (Signed)
   Subjective:    Patient ID: Thomas Washington, male    DOB: June 17, 1950, 66 y.o.   MRN: [id number]  HPI pleasant 67 year old white male in today for health maintenance exam and evaluation of medical issues.  History of controlled type 2 diabetes mellitus, essential hypertension, obesity, hyperlipidemia, metabolic syndrome.  History of BPH for which he takes Flomax.  No known drug allergies.  He had colonoscopy by [name] in October 2018.  2 tubular adenomas in 1 polyp had benign mucosa.  BMI is 30.65  History of Candida esophagitis 1991.  Schatzki's ring 19 and was seen by [name], cardiologist.  Family history: Mother with history of stroke died of complications of dementia and stroke.  Father died at age 32 suddenly presumably of a sudden MI.  One sister in good health.  Social history: Married with 1 adult daughter.  He is a Software engineer.  Non-smoker.  Social alcohol consumption.  Sees Dr. Nyoka Cowden for diabetic eye exam.  A negative exercise tolerance test 1995 by Dr. Rex Kras.          Review of Systems  Constitutional: Negative.   Genitourinary:       Mild BPH symptoms  All other systems reviewed and are negative.      Objective:   Physical Exam  Constitutional: He is oriented to person, place, and time. He appears well-developed and well-nourished. No distress.  HENT:  Head: Normocephalic and atraumatic.  Right Ear: External ear normal.  Left Ear: External ear normal.  Mouth/Throat: Oropharynx is clear and moist.  Eyes: Conjunctivae and EOM are normal. Pupils are equal, round, and reactive to light. Right eye exhibits no discharge. Left eye exhibits no discharge.  Neck: Neck supple. No JVD present. No thyromegaly present.  Cardiovascular: Normal rate, regular rhythm, normal heart sounds and intact distal pulses.  No murmur heard. Pulmonary/Chest: Effort normal and breath sounds normal. No respiratory distress. He has no wheezes. He has no rales.  Abdominal:  Soft. Bowel sounds are normal. He exhibits no distension and no mass. There is no tenderness. There is no rebound and no guarding.  Genitourinary:  Genitourinary Comments: Prostate slightly enlarged without nodules  Lymphadenopathy:    He has no cervical adenopathy.  Neurological: He is alert and oriented to person, place, and time. He has normal reflexes. No cranial nerve deficit. Coordination normal.  Skin: No rash noted. He is not diaphoretic.  Psychiatric: He has a normal mood and affect. His behavior is normal. Judgment and thought content normal.  Vitals reviewed.         Assessment & Plan:  Controlled type 2 diabetes mellitus-hemoglobin A1c stable at 6.6%  Hyperlipidemia-lipid panel normal on statin medication  Essential hypertension-stable on current regimen  Metabolic syndrome  Obesity  BPH-treated with Flomax.  PSA is normal  Plan: He gets exercise with hunting.  Continue diet exercise and weight loss regimen and return in 6 months for follow-up.

## 2017-07-09 NOTE — Patient Instructions (Signed)
It was a pleasure to see you today.  Continue to work on diet exercise and weight loss efforts.  Return in 6 months.

## 2017-09-15 LAB — HM DIABETES EYE EXAM

## 2017-09-21 ENCOUNTER — Encounter: Payer: Self-pay | Admitting: Internal Medicine

## 2017-11-08 DIAGNOSIS — Z0100 Encounter for examination of eyes and vision without abnormal findings: Secondary | ICD-10-CM | POA: Diagnosis not present

## 2017-12-25 ENCOUNTER — Other Ambulatory Visit: Payer: Self-pay | Admitting: Internal Medicine

## 2017-12-25 DIAGNOSIS — Z5181 Encounter for therapeutic drug level monitoring: Secondary | ICD-10-CM

## 2017-12-25 DIAGNOSIS — E1142 Type 2 diabetes mellitus with diabetic polyneuropathy: Secondary | ICD-10-CM

## 2017-12-25 DIAGNOSIS — Z79899 Other long term (current) drug therapy: Secondary | ICD-10-CM

## 2017-12-25 DIAGNOSIS — E785 Hyperlipidemia, unspecified: Secondary | ICD-10-CM

## 2017-12-25 DIAGNOSIS — I10 Essential (primary) hypertension: Secondary | ICD-10-CM

## 2017-12-25 DIAGNOSIS — E119 Type 2 diabetes mellitus without complications: Secondary | ICD-10-CM

## 2017-12-26 ENCOUNTER — Other Ambulatory Visit: Payer: Medicare HMO | Admitting: Internal Medicine

## 2017-12-26 DIAGNOSIS — E785 Hyperlipidemia, unspecified: Secondary | ICD-10-CM | POA: Diagnosis not present

## 2017-12-26 DIAGNOSIS — Z5181 Encounter for therapeutic drug level monitoring: Secondary | ICD-10-CM | POA: Diagnosis not present

## 2017-12-26 DIAGNOSIS — Z79899 Other long term (current) drug therapy: Secondary | ICD-10-CM

## 2017-12-26 DIAGNOSIS — E1142 Type 2 diabetes mellitus with diabetic polyneuropathy: Secondary | ICD-10-CM

## 2017-12-26 DIAGNOSIS — I10 Essential (primary) hypertension: Secondary | ICD-10-CM

## 2017-12-26 DIAGNOSIS — E119 Type 2 diabetes mellitus without complications: Secondary | ICD-10-CM | POA: Diagnosis not present

## 2017-12-28 ENCOUNTER — Ambulatory Visit (INDEPENDENT_AMBULATORY_CARE_PROVIDER_SITE_OTHER): Payer: Medicare HMO | Admitting: Internal Medicine

## 2017-12-28 ENCOUNTER — Encounter: Payer: Self-pay | Admitting: Internal Medicine

## 2017-12-28 VITALS — BP 110/80 | HR 88 | Ht 67.5 in | Wt 193.0 lb

## 2017-12-28 DIAGNOSIS — E8881 Metabolic syndrome: Secondary | ICD-10-CM | POA: Diagnosis not present

## 2017-12-28 DIAGNOSIS — Z6829 Body mass index (BMI) 29.0-29.9, adult: Secondary | ICD-10-CM

## 2017-12-28 DIAGNOSIS — E785 Hyperlipidemia, unspecified: Secondary | ICD-10-CM

## 2017-12-28 DIAGNOSIS — I2510 Atherosclerotic heart disease of native coronary artery without angina pectoris: Secondary | ICD-10-CM | POA: Diagnosis not present

## 2017-12-28 DIAGNOSIS — E1142 Type 2 diabetes mellitus with diabetic polyneuropathy: Secondary | ICD-10-CM | POA: Diagnosis not present

## 2017-12-28 DIAGNOSIS — E119 Type 2 diabetes mellitus without complications: Secondary | ICD-10-CM

## 2017-12-28 NOTE — Progress Notes (Signed)
   Subjective:    Patient ID: Thomas Washington, male    DOB: Feb 23, 1951, 67 y.o.   MRN: [id number]  HPI 67 year old Male in today for six-month follow-up.  Has numbness in right foot.  B12 level will be checked but he does take B12 orally.  History of hyperlipidemia treated with Lipitor 80 mg daily.  History of controlled type 2 diabetes mellitus treated with metformin and Januvia.  Father died at age 65 of an MI.  He will be referred to cardiology for screening.  Remote history of pericarditis in 2014 seen by [name].  Has taken ramipril 10 mg daily in the past but thought it made him fatigued.  Subsequently switched to Cozaar.  Does not appear that he is currently taking an ACE inhibitor or arm.  He takes Wellbutrin for mood disorder.  Takes Flomax for BPH.  Blood pressure is excellent 110/80.  Weight is 193 pounds and BMI is 29.78.  He gets exercise with hunting.  His wife is Dr. Suszanne Conners.  Expecting first grandchild soon.  He is a retired Software engineer.  Review of Systems see above.  Right foot numbness     Objective:   Physical Exam Skin warm and dry.  Nodes none.  Neck is supple.  No JVD thyromegaly or carotid bruits.  Chest clear to auscultation without rales or wheezing.  Cardiac exam soft regular rate and rhythm normal S1 and S2.  Extremities without edema.       Assessment & Plan:  Controlled type 2 diabetes mellitus-hemoglobin A1c stable at 6.7%  Family history of coronary disease-refer to Cardiology for screening.  Father died at age 43 of an MI.  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  Peripheral neuropathy right foot-check B12 level  BMI 29-continue to watch diet and exercise  Hyperlipidemia-continue Lipitor 80 mg daily.  Lipid panel and liver functions are normal.

## 2017-12-29 LAB — LIPID PANEL
Cholesterol: 168 mg/dL (ref <200)
HDL: 57 mg/dL (ref >40)
LDL Cholesterol (Calc): 91 mg/dL (calc)
Non-HDL Cholesterol (Calc): 111 mg/dL (calc) (ref <130)
TRIGLYCERIDES: 107 mg/dL (ref <150)
Total CHOL/HDL Ratio: 2.9 (calc) (ref <5.0)

## 2017-12-29 LAB — TEST AUTHORIZATION

## 2017-12-29 LAB — HEPATIC FUNCTION PANEL
AG Ratio: 1.5 (calc) (ref 1.0–2.5)
ALKALINE PHOSPHATASE (APISO): 68 U/L (ref 40–115)
ALT: 19 U/L (ref 9–46)
AST: 16 U/L (ref 10–35)
Albumin: 4.8 g/dL (ref 3.6–5.1)
BILIRUBIN DIRECT: 0.2 mg/dL (ref 0.0–0.2)
BILIRUBIN TOTAL: 0.8 mg/dL (ref 0.2–1.2)
Globulin: 3.1 g/dL (calc) (ref 1.9–3.7)
Indirect Bilirubin: 0.6 mg/dL (calc) (ref 0.2–1.2)
TOTAL PROTEIN: 7.9 g/dL (ref 6.1–8.1)

## 2017-12-29 LAB — VITAMIN B12

## 2017-12-29 LAB — MICROALBUMIN / CREATININE URINE RATIO
CREATININE, URINE: 86 mg/dL (ref 20–320)
Microalb Creat Ratio: 23 mcg/mg creat (ref <30)
Microalb, Ur: 2 mg/dL

## 2017-12-29 LAB — HEMOGLOBIN A1C
Hgb A1c MFr Bld: 6.7 % of total Hgb — ABNORMAL HIGH (ref <5.7)
Mean Plasma Glucose: 146 (calc)
eAG (mmol/L): 8.1 (calc)

## 2018-01-01 NOTE — Patient Instructions (Signed)
Referral to Cardiology for screening for coronary artery disease with strong family history.  Continue same medications and follow-up in 6 months.  B12 level checked and was within normal limits.

## 2018-01-26 ENCOUNTER — Ambulatory Visit (INDEPENDENT_AMBULATORY_CARE_PROVIDER_SITE_OTHER): Payer: Medicare HMO | Admitting: Internal Medicine

## 2018-01-26 VITALS — BP 102/80 | HR 70 | Temp 98.0°F | Ht 67.0 in | Wt 193.0 lb

## 2018-01-26 DIAGNOSIS — Z23 Encounter for immunization: Secondary | ICD-10-CM

## 2018-01-26 NOTE — Progress Notes (Signed)
Tdap given bt CMA

## 2018-01-26 NOTE — Patient Instructions (Signed)
Tdap given.  

## 2018-02-04 ENCOUNTER — Other Ambulatory Visit: Payer: Self-pay | Admitting: Internal Medicine

## 2018-04-03 ENCOUNTER — Telehealth: Payer: Self-pay | Admitting: Internal Medicine

## 2018-04-03 MED ORDER — ACCU-CHEK AVIVA PLUS W/DEVICE KIT: PACK | 0 refills | Status: DC

## 2018-04-03 MED ORDER — GLUCOSE BLOOD VI STRP: ORAL_STRIP | 2 refills | Status: DC

## 2018-04-03 MED ORDER — ACCU-CHEK FASTCLIX LANCETS MISC: 2 refills | Status: DC

## 2018-04-03 NOTE — Telephone Encounter (Signed)
Patient called that he need to change to glucose meter to Acc-[employer]

## 2018-04-04 DIAGNOSIS — R69 Illness, unspecified: Secondary | ICD-10-CM | POA: Diagnosis not present

## 2018-04-06 DIAGNOSIS — R69 Illness, unspecified: Secondary | ICD-10-CM | POA: Diagnosis not present

## 2018-04-24 ENCOUNTER — Other Ambulatory Visit: Payer: Self-pay | Admitting: Internal Medicine

## 2018-06-16 ENCOUNTER — Emergency Department (HOSPITAL_COMMUNITY): Payer: Medicare HMO

## 2018-06-16 ENCOUNTER — Emergency Department (HOSPITAL_COMMUNITY)
Admission: EM | Admit: 2018-06-16 | Discharge: 2018-06-16 | Disposition: A | Discharge status: Home / Self Care | Payer: Medicare HMO | Attending: Emergency Medicine | Admitting: Emergency Medicine

## 2018-06-16 ENCOUNTER — Encounter (HOSPITAL_COMMUNITY): Payer: Self-pay | Admitting: Emergency Medicine

## 2018-06-16 ENCOUNTER — Other Ambulatory Visit: Payer: Self-pay

## 2018-06-16 DIAGNOSIS — Z7982 Long term (current) use of aspirin: Secondary | ICD-10-CM | POA: Diagnosis not present

## 2018-06-16 DIAGNOSIS — I10 Essential (primary) hypertension: Secondary | ICD-10-CM | POA: Insufficient documentation

## 2018-06-16 DIAGNOSIS — N200 Calculus of kidney: Secondary | ICD-10-CM | POA: Diagnosis not present

## 2018-06-16 DIAGNOSIS — N132 Hydronephrosis with renal and ureteral calculous obstruction: Secondary | ICD-10-CM | POA: Diagnosis not present

## 2018-06-16 DIAGNOSIS — Z79899 Other long term (current) drug therapy: Secondary | ICD-10-CM | POA: Diagnosis not present

## 2018-06-16 DIAGNOSIS — R1084 Generalized abdominal pain: Secondary | ICD-10-CM | POA: Diagnosis present

## 2018-06-16 DIAGNOSIS — Z7984 Long term (current) use of oral hypoglycemic drugs: Secondary | ICD-10-CM | POA: Diagnosis not present

## 2018-06-16 DIAGNOSIS — E119 Type 2 diabetes mellitus without complications: Secondary | ICD-10-CM | POA: Insufficient documentation

## 2018-06-16 LAB — COMPREHENSIVE METABOLIC PANEL
ALT: 33 U/L (ref 0–44)
AST: 31 U/L (ref 15–41)
Albumin: 4.7 g/dL (ref 3.5–5.0)
Alkaline Phosphatase: 54 U/L (ref 38–126)
Anion gap: 15 (ref 5–15)
BUN: 18 mg/dL (ref 8–23)
CO2: 23 mmol/L (ref 22–32)
Calcium: 9.5 mg/dL (ref 8.9–10.3)
Chloride: 101 mmol/L (ref 98–111)
Creatinine, Ser: 1.03 mg/dL (ref 0.61–1.24)
GFR calc Af Amer: >60 mL/min (ref >60)
GFR calc non Af Amer: >60 mL/min (ref >60)
Glucose, Bld: 188 mg/dL — ABNORMAL HIGH (ref 70–99)
POTASSIUM: 4 mmol/L (ref 3.5–5.1)
Sodium: 139 mmol/L (ref 135–145)
Total Bilirubin: 0.8 mg/dL (ref 0.3–1.2)
Total Protein: 7.8 g/dL (ref 6.5–8.1)

## 2018-06-16 LAB — URINALYSIS, ROUTINE W REFLEX MICROSCOPIC
Bacteria, UA: NONE SEEN
Bilirubin Urine: NEGATIVE
Glucose, UA: NEGATIVE mg/dL
Ketones, ur: 20 mg/dL — AB
Leukocytes, UA: NEGATIVE
Nitrite: NEGATIVE
PH: 5 (ref 5.0–8.0)
Protein, ur: 100 mg/dL — AB
Specific Gravity, Urine: 1.021 (ref 1.005–1.030)

## 2018-06-16 LAB — LIPASE, BLOOD: Lipase: 31 U/L (ref 11–51)

## 2018-06-16 LAB — CBC
HCT: 47.8 % (ref 39.0–52.0)
Hemoglobin: 15.5 g/dL (ref 13.0–17.0)
MCH: 29.1 pg (ref 26.0–34.0)
MCHC: 32.4 g/dL (ref 30.0–36.0)
MCV: 89.7 fL (ref 80.0–100.0)
Platelets: 260 10*3/uL (ref 150–400)
RBC: 5.33 MIL/uL (ref 4.22–5.81)
RDW: 13.5 % (ref 11.5–15.5)
WBC: 13 10*3/uL — ABNORMAL HIGH (ref 4.0–10.5)
nRBC: 0 % (ref 0.0–0.2)

## 2018-06-16 LAB — TROPONIN I: Troponin I: <0.03 ng/mL (ref <0.03)

## 2018-06-16 MED ORDER — OXYCODONE-ACETAMINOPHEN 5-325 MG PO TABS: 1.0000 | ORAL_TABLET | Freq: Four times a day (QID) | ORAL | 0 refills | Status: DC | PRN

## 2018-06-16 MED ORDER — TAMSULOSIN HCL 0.4 MG PO CAPS: 0.4000 mg | ORAL_CAPSULE | Freq: Every day | ORAL | 0 refills | Status: DC

## 2018-06-16 MED ORDER — MORPHINE SULFATE (PF) 4 MG/ML IV SOLN
4.0000 mg | Freq: Once | INTRAVENOUS | Status: AC
  Administered 2018-06-16: 4 mg via INTRAVENOUS
  Filled 2018-06-16: qty 1

## 2018-06-16 MED ORDER — SODIUM CHLORIDE 0.9 % IV BOLUS
500.0000 mL | Freq: Once | INTRAVENOUS | Status: AC
  Administered 2018-06-16: 500 mL via INTRAVENOUS

## 2018-06-16 MED ORDER — ONDANSETRON 4 MG PO TBDP: 4.0000 mg | ORAL_TABLET | Freq: Three times a day (TID) | ORAL | 0 refills | Status: DC | PRN

## 2018-06-16 MED ORDER — ONDANSETRON HCL 4 MG/2ML IJ SOLN
4.0000 mg | Freq: Once | INTRAMUSCULAR | Status: AC
  Administered 2018-06-16: 4 mg via INTRAVENOUS
  Filled 2018-06-16: qty 2

## 2018-06-16 NOTE — ED Provider Notes (Signed)
[facility] DEPT Provider Note   CSN: [id number] Arrival date & time: 06/16/18  1142     History   Chief Complaint Chief Complaint  Patient presents with  . Abdominal Pain  . Nausea    HPI Thomas Washington is a 68 y.o. male with a hx of DM, hypercholesterolemia, MRSA, and prior vasectomy who presents to the ED with complaints of abdominal pain that started at 0800 this AM. Patient states the pain came on fairly quickly, it is located in the mid L abdomen and has been waxing/waning since onset. At worst pain is a 7/10 in severity. No alleviating/aggravating factors. Somewhat similar to prior kidney stone 20 years prior, but not entirely. Has had nausea w/ associated multiple episodes of non bloody emesis. Denies fever, chills, chest pain, dyspnea, diarrhea, blood in stool, dysuria, or frequency.   HPI  Past Medical History:  Diagnosis Date  . Diabetes mellitus without complication ([place])   . Hypercholesteremia   . MRSA (methicillin resistant staph aureus) culture positive     Patient Active Problem List   Diagnosis Date Noted  . Diabetic peripheral neuropathy ([place]) 07/09/2017  . Family history of coronary arteriosclerosis 01/19/2014  . Acute pericarditis, unspecified 09/12/2012  . Essential hypertension 09/12/2012  . Controlled diabetes mellitus ([place]) 09/11/2012  . Hyperlipidemia 09/11/2012  . Obesity 09/11/2012    Past Surgical History:  Procedure Laterality Date  . VASECTOMY    . WISDOM TOOTH EXTRACTION          Home Medications    Prior to Admission medications   Medication Sig Start Date End Date Taking? Authorizing Provider  ACCU-CHEK FASTCLIX LANCETS MISC Use as instructed to test blood sugar daily 04/03/18   Elby Showers, MD  aspirin EC 81 MG tablet Take 81 mg by mouth daily.    [provider]  atorvastatin (LIPITOR) 80 MG tablet TAKE 1 TABLET BY MOUTH EVERY DAY 02/04/18   Elby Showers, MD  B Complex Vitamins (B  COMPLEX PO) Take by mouth.    [provider]  Blood Glucose Monitoring Suppl (ACCU-CHEK AVIVA PLUS) w/Device KIT Use as instructed to test blood sugar daily 04/03/18   Elby Showers, MD  buPROPion [facility] SR) 150 MG 12 hr tablet Take 150 mg by mouth 2 (two) times daily.    [provider]  Cholecalciferol (VITAMIN D) 2000 UNITS tablet Take 4,000 Units by mouth daily.    [provider]  Coenzyme Q10 (CO Q 10) 100 MG CAPS Take 100 mg by mouth daily.    [provider]  Cyanocobalamin (VITAMIN B 12 PO) Take by mouth daily.     [provider]  folic acid (FOLVITE) 1 MG tablet Take 800 mg by mouth daily.     [provider]  glucose blood (ACCU-CHEK ACTIVE STRIPS) test strip Use as instructed to test blood sugar daily 04/03/18   Elby Showers, MD  JANUVIA 100 MG tablet TAKE 1 TABLET BY MOUTH EVERY DAY 02/04/18   Elby Showers, MD  metFORMIN (GLUCOPHAGE) 1000 MG tablet TAKE 1 TABLET BY MOUTH TWICE A DAY WITH A MEAL 04/25/18   Elby Showers, MD  tamsulosin (FLOMAX) 0.4 MG CAPS capsule Take 1 capsule (0.4 mg total) by mouth daily after breakfast. 10/06/15   Baxley, Cresenciano Lick, MD    Family History Family History  Problem Relation Age of Onset  . Heart attack Father   . Stroke Mother   . Healthy  Sister   . Healthy Daughter   . Colon cancer Neg Hx     Social History Social History   Tobacco Use  . Smoking status: Never Smoker  . Smokeless tobacco: Never Used  Substance Use Topics  . Alcohol use: Yes    Comment: 1 bottle of wine monthly + 2 beers  . Drug use: No     Allergies   Patient has no known allergies.   Review of Systems Review of Systems  Constitutional: Negative for chills and fever.  Respiratory: Negative for shortness of breath.   Cardiovascular: Negative for chest pain.  Gastrointestinal: Positive for abdominal pain, nausea and vomiting. Negative for blood in stool, constipation and diarrhea.  Genitourinary:  Negative for dysuria, frequency and testicular pain.  All other systems reviewed and are negative.    Physical Exam Updated Vital Signs BP (!) 151/92 (BP Location: Left Arm)   Pulse 60   Temp 97.8 F (36.6 C) (Oral)   Resp 16   SpO2 97%   Physical Exam Vitals signs and nursing note reviewed.  Constitutional:      General: He is in acute distress (mild, patient appears uncomfortable).     Appearance: He is well-developed. He is not toxic-appearing.  HENT:     Head: Normocephalic and atraumatic.  Eyes:     General:        Right eye: No discharge.        Left eye: No discharge.     Conjunctiva/sclera: Conjunctivae normal.  Neck:     Musculoskeletal: Neck supple.  Cardiovascular:     Rate and Rhythm: Normal rate and regular rhythm.  Pulmonary:     Effort: Pulmonary effort is normal. No respiratory distress.     Breath sounds: Normal breath sounds. No wheezing, rhonchi or rales.  Abdominal:     General: There is no distension.     Palpations: Abdomen is soft.     Tenderness: There is no abdominal tenderness. There is no right CVA tenderness, left CVA tenderness, guarding or rebound.  Skin:    General: Skin is warm and dry.     Findings: No rash.  Neurological:     Mental Status: He is alert.     Comments: Clear speech.   Psychiatric:        Behavior: Behavior normal.    ED Treatments / Results  Labs (all labs ordered are listed, but only abnormal results are displayed) Labs Reviewed  COMPREHENSIVE METABOLIC PANEL - Abnormal; Notable for the following components:      Result Value   Glucose, Bld 188 (*)    All other components within normal limits  CBC - Abnormal; Notable for the following components:   WBC 13.0 (*)    All other components within normal limits  URINALYSIS, ROUTINE W REFLEX MICROSCOPIC - Abnormal; Notable for the following components:   Color, Urine AMBER (*)    APPearance HAZY (*)    Hgb urine dipstick MODERATE (*)    Ketones, ur 20 (*)     Protein, ur 100 (*)    All other components within normal limits  LIPASE, BLOOD  TROPONIN I    EKG None  ED ECG REPORT   Date: 06/16/2018  EKG Time: 12:56  Rate: 59  Rhythm: normal sinus rhythm,  normal sinus rhythm  Axis: Normal  ST&T Change: No STEMI  Radiology Ct Renal Stone Study  Result Date: 06/16/2018 CLINICAL DATA:  Mid abdominal pain EXAM: CT ABDOMEN AND PELVIS WITHOUT  CONTRAST TECHNIQUE: Multidetector CT imaging of the abdomen and pelvis was performed following the standard protocol without IV contrast. COMPARISON:  None. FINDINGS: Lower chest: Subsegmental atelectasis at the base of the right middle lobe and lingula. Hepatobiliary: Unremarkable Pancreas: Unremarkable Spleen: Unremarkable Adrenals/Urinary Tract: Mild left hydronephrosis. 2 mm calculus at the left ureterovesical junction. No evidence of right hydronephrosis. Adrenal glands are within normal limits. Bladder is otherwise unremarkable. Stomach/Bowel: Normal appendix. No obvious mass in the colon. Sigmoid diverticulosis. No evidence of small-bowel obstruction. Vascular/Lymphatic: Atherosclerotic vascular calcifications. No abnormal retroperitoneal adenopathy. Reproductive: Prostate is unremarkable. Other: No free fluid. Musculoskeletal: No vertebral compression deformity. IMPRESSION: 2 mm left ureterovesical junction calculus is associated with mild left hydronephrosis. Electronically Signed   By: Marybelle Killings M.D.   On: 06/16/2018 12:58    Procedures Procedures (including critical care time)  Medications Ordered in ED Medications  morphine 4 MG/ML injection 4 mg (4 mg Intravenous Given 06/16/18 1236)  ondansetron (ZOFRAN) injection 4 mg (4 mg Intravenous Given 06/16/18 1234)  sodium chloride 0.9 % bolus 500 mL (500 mLs Intravenous New Bag/Given 06/16/18 1233)     Initial Impression / Assessment and Plan / ED Course  I have reviewed the triage vital signs and the nursing notes.  Pertinent labs & imaging results that  were available during my care of the patient were reviewed by me and considered in my medical decision making (see chart for details).    Patient presents to the ED with complaints of L sided abdominal pain. Patient nontoxic appearing, does appear uncomfortable, vitals WNL other than elevated BP- doubt HTN emergency. On exam patient is without abdominal or CVA tenderness, no peritoneal signs. DDX: nephrolithiasis, pyelonephritis/UTI, cholecystitis, pancreatitis, bowel obstruction/perforation, appendicitis, dissection, ACS, feel nephrolithiasis is most likely at this time will evaluate with labs, EKG, and CT renal study, analgesics, anti-emetics, and fluids ordered.    CT renal study with 2 mm stone @ left ureterovesical junction. Renal function preserved. Urinalysis without appearance of superimposed infection.  Patient with non specific leukocytosis at 13.0.  No anemia or significant electrolyte derangements. Hyperglycemic at 188, no anion gap or acidosis. EKG without ischemic changes, trop negative, doubt ACS, w/ identifiable etiology do not feel delta troponin is necessary.   Patient tolerating PO in the ER with pain well controlled. Will discharge home with Flomax, Zofran, and Percocet with urology follow up. [place] Controlled Substance reporting System queried. I discussed results, treatment plan, need for urology follow-up, and return precautions with the patient. Provided opportunity for questions, patient confirmed understanding and is in agreement with plan.   Findings and plan of care discussed with supervising physician Dr. Maryan Rued who personally evaluated and examined this patient and is in agreement.   Vitals:   06/16/18 1236 06/16/18 1427  BP: (!) 142/83 134/84  Pulse: 60 61  Resp: 17 15  Temp:    SpO2: 100% 94%   Final Clinical Impressions(s) / ED Diagnoses   Final diagnoses:  Kidney stone    ED Discharge Orders         Ordered    oxyCODONE-acetaminophen  (PERCOCET/ROXICET) 5-325 MG tablet  Every 6 hours PRN     06/16/18 1416    ondansetron (ZOFRAN ODT) 4 MG disintegrating tablet  Every 8 hours PRN     06/16/18 1416    tamsulosin (FLOMAX) 0.4 MG CAPS capsule  Daily after supper     06/16/18 1416           Stina Gane R,  PA-C 06/16/18 1439    Blanchie Dessert, MD 06/16/18 1520

## 2018-06-16 NOTE — Discharge Instructions (Addendum)
You were seen in the emergency department and found to have a kidney stone on the left side just above the bladder. We are sending you home with multiple medications to assist with passing the stone:   -Flomax-this is a medication to help pass the stone, it allows urine to exit the body more freely.  Please take this once daily with a meal.  -Percocet-this is a narcotic/controlled substance medication that has potential addicting qualities.  We recommend that you take 1-2 tablets every 6 hours as needed for severe pain.  Do not drive or operate heavy machinery when taking this medicine as it can be sedating. Do not drink alcohol or take other sedating medications when taking this medicine for safety reasons.  Keep this out of reach of small children.  Please be aware this medicine has Tylenol in it (325 mg/tab) do not exceed the maximum dose of Tylenol in a day per over the counter recommendations should you decide to supplement with Tylenol over the counter.   -Zofran-this is an antinausea medication, you may take this every 8 hours as needed for nausea and vomiting, please allow the tablet to dissolve underneath of your tongue.   We have prescribed you new medication(s) today. Discuss the medications prescribed today with your pharmacist as they can have adverse effects and interactions with your other medicines including over the counter and prescribed medications. Seek medical evaluation if you start to experience new or abnormal symptoms after taking one of these medicines, seek care immediately if you start to experience difficulty breathing, feeling of your throat closing, facial swelling, or rash as these could be indications of a more serious allergic reaction  Please follow-up with the urology group provided in your discharge instructions within 3 to 5 days.  Return to the ER for new or worsening symptoms including but not limited to worsening pain not controlled by these medicines, inability to  keep fluids down, fever, or any other concerns that you may have.

## 2018-06-16 NOTE — ED Triage Notes (Signed)
Pt c/o L mid abdominal pain since this morning. Pt with nausea, denies v/d. Pt denies problems with urination but reports a hx of stones.

## 2018-07-03 ENCOUNTER — Other Ambulatory Visit: Payer: Medicare HMO | Admitting: Internal Medicine

## 2018-07-06 ENCOUNTER — Encounter: Payer: Medicare HMO | Admitting: Internal Medicine

## 2018-07-13 ENCOUNTER — Other Ambulatory Visit (INDEPENDENT_AMBULATORY_CARE_PROVIDER_SITE_OTHER): Payer: Medicare HMO | Admitting: Internal Medicine

## 2018-07-13 DIAGNOSIS — Z0000 Encounter for general adult medical examination without abnormal findings: Secondary | ICD-10-CM | POA: Diagnosis not present

## 2018-07-13 DIAGNOSIS — Z125 Encounter for screening for malignant neoplasm of prostate: Secondary | ICD-10-CM | POA: Diagnosis not present

## 2018-07-13 DIAGNOSIS — I2510 Atherosclerotic heart disease of native coronary artery without angina pectoris: Secondary | ICD-10-CM

## 2018-07-13 DIAGNOSIS — E785 Hyperlipidemia, unspecified: Secondary | ICD-10-CM

## 2018-07-13 DIAGNOSIS — E8881 Metabolic syndrome: Secondary | ICD-10-CM | POA: Diagnosis not present

## 2018-07-13 DIAGNOSIS — Z794 Long term (current) use of insulin: Principal | ICD-10-CM

## 2018-07-13 DIAGNOSIS — E119 Type 2 diabetes mellitus without complications: Secondary | ICD-10-CM

## 2018-07-13 DIAGNOSIS — E1142 Type 2 diabetes mellitus with diabetic polyneuropathy: Secondary | ICD-10-CM | POA: Diagnosis not present

## 2018-07-13 LAB — POCT URINALYSIS DIPSTICK
Appearance: NEGATIVE
Bilirubin, UA: NEGATIVE
Blood, UA: NEGATIVE
GLUCOSE UA: NEGATIVE
Ketones, UA: NEGATIVE
Leukocytes, UA: NEGATIVE
NITRITE UA: NEGATIVE
Odor: NEGATIVE
Protein, UA: POSITIVE — AB
Spec Grav, UA: 1.015 (ref 1.010–1.025)
UROBILINOGEN UA: 0.2 U/dL
pH, UA: 6.5 (ref 5.0–8.0)

## 2018-07-13 NOTE — Addendum Note (Signed)
Addended by: Mady Haagensen on: 07/13/2018 10:30 AM   Modules accepted: Orders

## 2018-07-14 LAB — MICROALBUMIN / CREATININE URINE RATIO
Creatinine, Urine: 150 mg/dL (ref 20–320)
Microalb Creat Ratio: 33 mcg/mg creat — ABNORMAL HIGH (ref <30)
Microalb, Ur: 4.9 mg/dL

## 2018-07-14 LAB — CBC WITH DIFFERENTIAL/PLATELET
Absolute Monocytes: 630 cells/uL (ref 200–950)
BASOS ABS: 90 {cells}/uL (ref 0–200)
Basophils Relative: 1.2 %
Eosinophils Absolute: 158 cells/uL (ref 15–500)
Eosinophils Relative: 2.1 %
HCT: 45.8 % (ref 38.5–50.0)
Hemoglobin: 15.6 g/dL (ref 13.2–17.1)
Lymphs Abs: 2138 cells/uL (ref 850–3900)
MCH: 29.8 pg (ref 27.0–33.0)
MCHC: 34.1 g/dL (ref 32.0–36.0)
MCV: 87.4 fL (ref 80.0–100.0)
MPV: 9.7 fL (ref 7.5–12.5)
Monocytes Relative: 8.4 %
Neutro Abs: 4485 cells/uL (ref 1500–7800)
Neutrophils Relative %: 59.8 %
Platelets: 271 10*3/uL (ref 140–400)
RBC: 5.24 10*6/uL (ref 4.20–5.80)
RDW: 13.4 % (ref 11.0–15.0)
Total Lymphocyte: 28.5 %
WBC: 7.5 10*3/uL (ref 3.8–10.8)

## 2018-07-14 LAB — PSA: PSA: 1.6 ng/mL (ref <=4.0)

## 2018-07-14 LAB — COMPLETE METABOLIC PANEL WITH GFR
AG Ratio: 1.7 (calc) (ref 1.0–2.5)
ALKALINE PHOSPHATASE (APISO): 57 U/L (ref 40–115)
ALT: 25 U/L (ref 9–46)
AST: 23 U/L (ref 10–35)
Albumin: 4.7 g/dL (ref 3.6–5.1)
BUN: 10 mg/dL (ref 7–25)
CO2: 30 mmol/L (ref 20–32)
Calcium: 10.2 mg/dL (ref 8.6–10.3)
Chloride: 100 mmol/L (ref 98–110)
Creat: 1.03 mg/dL (ref 0.70–1.25)
GFR, Est African American: 88 mL/min/{1.73_m2} (ref >=60)
GFR, Est Non African American: 76 mL/min/{1.73_m2} (ref >=60)
Globulin: 2.8 g/dL (calc) (ref 1.9–3.7)
Glucose, Bld: 130 mg/dL — ABNORMAL HIGH (ref 65–99)
Potassium: 4.6 mmol/L (ref 3.5–5.3)
SODIUM: 141 mmol/L (ref 135–146)
Total Bilirubin: 0.7 mg/dL (ref 0.2–1.2)
Total Protein: 7.5 g/dL (ref 6.1–8.1)

## 2018-07-14 LAB — LIPID PANEL
CHOLESTEROL: 158 mg/dL (ref <200)
HDL: 51 mg/dL (ref >40)
LDL Cholesterol (Calc): 90 mg/dL (calc)
Non-HDL Cholesterol (Calc): 107 mg/dL (calc) (ref <130)
Total CHOL/HDL Ratio: 3.1 (calc) (ref <5.0)
Triglycerides: 83 mg/dL (ref <150)

## 2018-07-14 LAB — HEMOGLOBIN A1C
Hgb A1c MFr Bld: 6.5 % of total Hgb — ABNORMAL HIGH (ref <5.7)
Mean Plasma Glucose: 140 (calc)
eAG (mmol/L): 7.7 (calc)

## 2018-07-17 ENCOUNTER — Ambulatory Visit (INDEPENDENT_AMBULATORY_CARE_PROVIDER_SITE_OTHER): Payer: Medicare HMO | Admitting: Internal Medicine

## 2018-07-17 ENCOUNTER — Encounter: Payer: Self-pay | Admitting: Internal Medicine

## 2018-07-17 VITALS — BP 130/80 | HR 74 | Ht 67.0 in | Wt 193.0 lb

## 2018-07-17 DIAGNOSIS — R351 Nocturia: Secondary | ICD-10-CM | POA: Diagnosis not present

## 2018-07-17 DIAGNOSIS — E8881 Metabolic syndrome: Secondary | ICD-10-CM | POA: Diagnosis not present

## 2018-07-17 DIAGNOSIS — E782 Mixed hyperlipidemia: Secondary | ICD-10-CM

## 2018-07-17 DIAGNOSIS — G609 Hereditary and idiopathic neuropathy, unspecified: Secondary | ICD-10-CM | POA: Diagnosis not present

## 2018-07-17 DIAGNOSIS — E1129 Type 2 diabetes mellitus with other diabetic kidney complication: Secondary | ICD-10-CM | POA: Diagnosis not present

## 2018-07-17 DIAGNOSIS — Z6830 Body mass index (BMI) 30.0-30.9, adult: Secondary | ICD-10-CM | POA: Diagnosis not present

## 2018-07-17 DIAGNOSIS — N401 Enlarged prostate with lower urinary tract symptoms: Secondary | ICD-10-CM

## 2018-07-17 DIAGNOSIS — Z0000 Encounter for general adult medical examination without abnormal findings: Secondary | ICD-10-CM

## 2018-07-17 DIAGNOSIS — I2510 Atherosclerotic heart disease of native coronary artery without angina pectoris: Secondary | ICD-10-CM

## 2018-07-17 DIAGNOSIS — I10 Essential (primary) hypertension: Secondary | ICD-10-CM

## 2018-07-17 DIAGNOSIS — R809 Proteinuria, unspecified: Secondary | ICD-10-CM

## 2018-07-17 MED ORDER — SCOPOLAMINE 1 MG/3DAYS TD PT72: 1.0000 | MEDICATED_PATCH | TRANSDERMAL | 12 refills | Status: DC

## 2018-07-17 MED ORDER — LEVOFLOXACIN 500 MG PO TABS: 500.0000 mg | ORAL_TABLET | Freq: Every day | ORAL | 0 refills | Status: DC

## 2018-07-17 NOTE — Progress Notes (Signed)
Subjective:    Patient ID: Thomas Washington, male    DOB: April 30, 1951, 68 y.o.   MRN: [id number]  HPI 68 year old White Male for Welcome to Medicare PE, health maintenance exam and evaluation of medical issues.  History of controlled type 2 diabetes mellitus, essential hypertension, obesity, hyperlipidemia, metabolic syndrome, BPH.  No known drug allergies.  Had colonoscopy by [name] October 2018.  2 tubular adenomas and one polyp with benign findings.  History of Candida esophagitis 1991.  Schatzki's ring 1991.  Had acute pericarditis 2014 and was seen by [name].  Social history: Married with 1 adult daughter and new grandson.  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  Non-smoker.  Social alcohol consumption.  Enjoys hunting.  Native of [place].  Wife is a retired Immunologist.  Family history: Mother with history of stroke died with complications of dementia and stroke.  Father died at age 74 suddenly presumably of a sudden MI.  One sister in good health.  Had negative exercise tolerance test in 1995 by Dr. Chancy Milroy.  Sees Dr. Delman Cheadle for diabetic eye exam.    Review of Systems  Constitutional: Negative.   Genitourinary:       Nocturia but not excessive  All other systems reviewed and are negative.      Objective:   Physical Exam Vitals signs reviewed.  Constitutional:      Appearance: Normal appearance.  HENT:     Head: Normocephalic and atraumatic.     Right Ear: Tympanic membrane normal.     Left Ear: Tympanic membrane normal.     Nose: Nose normal.     Mouth/Throat:     Mouth: Mucous membranes are moist.  Eyes:     General: No scleral icterus.       Right eye: No discharge.        Left eye: No discharge.     Extraocular Movements: Extraocular movements intact.     Conjunctiva/sclera: Conjunctivae normal.     Pupils: Pupils are equal, round, and reactive to light.  Neck:     Musculoskeletal: Neck supple. No neck rigidity.     Vascular:  No carotid bruit.     Comments: No thyromegaly Cardiovascular:     Rate and Rhythm: Normal rate and regular rhythm.     Pulses: Normal pulses.     Heart sounds: Normal heart sounds. No murmur.  Pulmonary:     Effort: Pulmonary effort is normal.     Breath sounds: Normal breath sounds. No wheezing or rales.  Abdominal:     General: Bowel sounds are normal.     Palpations: Abdomen is soft. There is no mass.     Tenderness: There is no abdominal tenderness. There is no rebound.  Genitourinary:    Prostate: Normal.     Comments: No nodules on prostate exam Lymphadenopathy:     Cervical: No cervical adenopathy.  Skin:    General: Skin is warm and dry.  Neurological:     General: No focal deficit present.     Mental Status: He is alert and oriented to person, place, and time.     Cranial Nerves: No cranial nerve deficit.     Gait: Gait normal.  Psychiatric:        Mood and Affect: Mood normal.        Behavior: Behavior normal.        Thought Content: Thought content normal.  Judgment: Judgment normal.           Assessment & Plan:  Labs: Fasting glucose 130.  BUN and creatinine are normal.  CBC and lipid panel are normal.  PSA is normal at 1.6.  Microalbumin to creatinine ratio 33.  Hemoglobin A1c 6.5%.  In July was 6.7%.  Controlled type 2 diabetes mellitus-continue to work on diet exercise and weight loss  Hyperlipidemia-lipid panel normal on statin therapy  Essential hypertension-stable on current regimen  Obesity-BMI 30.23 continue to work on diet and exercise  Peripheral neuropathy-treated with B12 orally.  Level checked in 2016 was 414.  Patient says B12 has helped his neuropathy symptoms.  It is not clear if he has diabetic peripheral neuropathy or B12 deficiency but clearly he says B12 is helped  Microalbuminuria-watch diabetic control.  No change in treatment  BPH-continue Flomax  Plan: Continue current medications and follow-up in 6 months  Subjective:    Patient presents for Medicare Annual/Subsequent preventive examination.  Review Past Medical/Family/Social: See above    Current exercise habits: Gets exercise with hunting and walking his property Dietary issues discussed: Low-fat low carbohydrate  Cardiac risk factors: Diabetes mellitus, hyperlipidemia, family history  Depression Screen  (Note: if answer to either of the following is "Yes", a more complete depression screening is indicated)   Over the past two weeks, have you felt down, depressed or hopeless? No  Over the past two weeks, have you felt little interest or pleasure in doing things? No Have you lost interest or pleasure in daily life? No Do you often feel hopeless? No Do you cry easily over simple problems? No   Activities of Daily Living  In your present state of health, do you have any difficulty performing the following activities?:   Driving? No  Managing money? No  Feeding yourself? No  Getting from bed to chair? No  Climbing a flight of stairs? No  Preparing food and eating?: No  Bathing or showering? No  Getting dressed: No  Getting to the toilet? No  Using the toilet:No  Moving around from place to place: No  In the past year have you fallen or had a near fall?:No  Are you sexually active? No  Do you have more than one partner? No   Hearing Difficulties: No  Do you often ask people to speak up or repeat themselves? No  Do you experience ringing or noises in your ears? No  Do you have difficulty understanding soft or whispered voices? No  Do you feel that you have a problem with memory? No Do you often misplace items? No    Home Safety:  Do you have a smoke alarm at your residence? Yes Do you have grab bars in the bathroom?  Yes Do you have throw rugs in your house?  Yes   Cognitive Testing  Alert? Yes Normal Appearance?Yes  Oriented to person? Yes Place? Yes  Time? Yes  Recall of three objects? Yes  Can perform simple calculations?  Yes  Displays appropriate judgment?Yes  Can read the correct time from a watch face?Yes   List the Names of Other Physician/Practitioners you currently use:  See referral list for the physicians patient is currently seeing.  Dr. Delman Cheadle, for eye exam   Review of Systems: See above   Objective:     General appearance: Appears younger than stated age and mildly obese  Head: Normocephalic, without obvious abnormality, atraumatic  Eyes: conj clear, EOMi PEERLA  Ears: normal TM's and  external ear canals both ears  Nose: Nares normal. Septum midline. Mucosa normal. No drainage or sinus tenderness.  Throat: lips, mucosa, and tongue normal; teeth and gums normal  Neck: no adenopathy, no carotid bruit, no JVD, supple, symmetrical, trachea midline and thyroid not enlarged, symmetric, no tenderness/mass/nodules  No CVA tenderness.  Lungs: clear to auscultation bilaterally  Breasts: normal appearance, no masses or tenderness Heart: regular rate and rhythm, S1, S2 normal, no murmur, click, rub or gallop  Abdomen: soft, non-tender; bowel sounds normal; no masses, no organomegaly  Musculoskeletal: ROM normal in all joints, no crepitus, no deformity, Normal muscle strengthen. Back  is symmetric, no curvature. Skin: Skin color, texture, turgor normal. No rashes or lesions  Lymph nodes: Cervical, supraclavicular, and axillary nodes normal.  Neurologic: CN 2 -12 Normal, Normal symmetric reflexes. Normal coordination and gait  Psych: Alert & Oriented x 3, Mood appear stable.    Assessment:    Annual wellness medicare exam   Plan:    During the course of the visit the patient was educated and counseled about appropriate screening and preventive services including:   Annual flu vaccine  Needs to repeat Prevnar 13 now that he is 86 other immunizations are up-to-date  Has had Shingrix     Patient Instructions (the written plan) was given to the patient.  Medicare Attestation  I have  personally reviewed:  The patient's medical and social history  Their use of alcohol, tobacco or illicit drugs  Their current medications and supplements  The patient's functional ability including ADLs,fall risks, home safety risks, cognitive, and hearing and visual impairment  Diet and physical activities  Evidence for depression or mood disorders  The patient's weight, height, BMI, and visual acuity have been recorded in the chart. I have made referrals, counseling, and provided education to the patient based on review of the above and I have provided the patient with a written personalized care plan for preventive services.

## 2018-07-23 DIAGNOSIS — R69 Illness, unspecified: Secondary | ICD-10-CM | POA: Diagnosis not present

## 2018-08-05 ENCOUNTER — Encounter: Payer: Self-pay | Admitting: Internal Medicine

## 2018-08-05 NOTE — Patient Instructions (Addendum)
It was a pleasure to see you today.  Continue to work on diet exercise and weight loss.  Return in 6 months.  No change in medications.

## 2018-08-07 DIAGNOSIS — R69 Illness, unspecified: Secondary | ICD-10-CM | POA: Diagnosis not present

## 2018-08-09 ENCOUNTER — Other Ambulatory Visit: Payer: Self-pay

## 2018-08-09 DIAGNOSIS — E785 Hyperlipidemia, unspecified: Secondary | ICD-10-CM

## 2018-08-09 NOTE — Progress Notes (Signed)
Ordered CT calcium score for Dr. Verlene Mayer patient. Dr. Johnsie Cancel to read.

## 2018-09-05 ENCOUNTER — Inpatient Hospital Stay: Admission: RE | Admit: 2018-09-05 | Payer: Medicare HMO | Source: Ambulatory Visit

## 2018-10-17 DIAGNOSIS — E119 Type 2 diabetes mellitus without complications: Secondary | ICD-10-CM | POA: Diagnosis not present

## 2018-11-02 ENCOUNTER — Telehealth: Payer: Self-pay | Admitting: *Deleted

## 2018-11-02 NOTE — Telephone Encounter (Signed)
Script Screening patients for COVID-19 and reviewing new operational procedures  Greeting - The reason I am calling is to share with you some new changes to our processes that are designed to help [place] keep everyone safe. Is now a good time to speak with you? Patient says "no' - ask them when you can call back and let them know it's important to do this prior to their appointment.  Patient says "yes" - Doristine Devoid, Izael Bessinger the first thing I need to do is ask you some screening Questions.  1. To the best of your knowledge, have you been in close contact with any one with a confirmed diagnosis of COVID 19? o No - proceed to next question   2. Have you had any one or more of the following: fever, chills, cough, shortness of breath or any flu-like symptoms? o No - proceed to next question  3. Have you been diagnosed with or have a previous diagnosis of COVID 19? o No - proceed to next question  4. I am going to go over a few other symptoms with you. Please let me know if you are experiencing any of the following: . Ear, nose or throat discomfort . A sore throat . Headache . Muscle pain . Diarrhea . Loss of taste or smell o No - proceed to next question  Thank you for answering these questions. Please know we will ask you these questions or similar questions when you arrive for your appointment and again it's how we are keeping everyone safe. Also, to keep you safe, please use the provided hand sanitizer when you enter the building. Manuella Ghazi), we are asking everyone in the building to wear a mask because they help [place] prevent the spread of germs. Do you have a mask of your own, if not, we are happy to provide one for you. The last thing I want to go over with you is the no visitor guidelines. This means no one can attend the appointment with you unless you need physical assistance. I understand this may be different from your past appointments and I know this may be difficult but  please know if someone is driving you we are happy to call them for you once your appointment is over.    Manuella Ghazi) I've given you a lot of information, what questions do you have about what I've talked about today or your appointment tomorrow?

## 2018-11-06 ENCOUNTER — Ambulatory Visit (INDEPENDENT_AMBULATORY_CARE_PROVIDER_SITE_OTHER)
Admission: RE | Admit: 2018-11-06 | Discharge: 2018-11-06 | Disposition: A | Discharge status: Home / Self Care | Payer: Medicare HMO | Source: Ambulatory Visit | Attending: Cardiovascular Disease | Admitting: Cardiovascular Disease

## 2018-11-06 ENCOUNTER — Other Ambulatory Visit: Payer: Self-pay

## 2018-11-06 DIAGNOSIS — E785 Hyperlipidemia, unspecified: Secondary | ICD-10-CM

## 2018-11-09 ENCOUNTER — Telehealth (INDEPENDENT_AMBULATORY_CARE_PROVIDER_SITE_OTHER): Payer: Medicare HMO | Admitting: Cardiovascular Disease

## 2018-11-09 ENCOUNTER — Other Ambulatory Visit: Payer: Self-pay

## 2018-11-09 ENCOUNTER — Other Ambulatory Visit: Payer: Self-pay | Admitting: Cardiovascular Disease

## 2018-11-09 ENCOUNTER — Other Ambulatory Visit (HOSPITAL_COMMUNITY)
Admission: RE | Admit: 2018-11-09 | Discharge: 2018-11-09 | Disposition: A | Discharge status: Home / Self Care | Payer: Medicare HMO | Source: Ambulatory Visit | Attending: Interventional Cardiology | Admitting: Interventional Cardiology

## 2018-11-09 ENCOUNTER — Other Ambulatory Visit: Payer: Medicare HMO | Admitting: *Deleted

## 2018-11-09 ENCOUNTER — Encounter: Payer: Self-pay | Admitting: Cardiovascular Disease

## 2018-11-09 VITALS — BP 124/74 | HR 70 | Ht 67.0 in | Wt 185.0 lb

## 2018-11-09 DIAGNOSIS — E785 Hyperlipidemia, unspecified: Secondary | ICD-10-CM | POA: Diagnosis not present

## 2018-11-09 DIAGNOSIS — I208 Other forms of angina pectoris: Secondary | ICD-10-CM | POA: Diagnosis not present

## 2018-11-09 DIAGNOSIS — I2510 Atherosclerotic heart disease of native coronary artery without angina pectoris: Secondary | ICD-10-CM

## 2018-11-09 DIAGNOSIS — Z1159 Encounter for screening for other viral diseases: Secondary | ICD-10-CM | POA: Diagnosis not present

## 2018-11-09 DIAGNOSIS — Z7189 Other specified counseling: Secondary | ICD-10-CM

## 2018-11-09 DIAGNOSIS — I2584 Coronary atherosclerosis due to calcified coronary lesion: Secondary | ICD-10-CM | POA: Insufficient documentation

## 2018-11-09 MED ORDER — ROSUVASTATIN CALCIUM 40 MG PO TABS: 40.0000 mg | ORAL_TABLET | Freq: Every day | ORAL | 11 refills | Status: DC

## 2018-11-09 MED ORDER — NITROGLYCERIN 0.4 MG SL SUBL: 0.4000 mg | SUBLINGUAL_TABLET | SUBLINGUAL | 6 refills | Status: DC | PRN

## 2018-11-09 NOTE — H&P (View-Only) (Signed)
 Virtual Visit via Video Note   This visit type was conducted due to national recommendations for restrictions regarding the COVID-19 Pandemic (e.g. social distancing) in an effort to limit this patient's exposure and mitigate transmission in our community.  Due to his co-morbid illnesses, this patient is at least at moderate risk for complications without adequate follow up.  This format is felt to be most appropriate for this patient at this time.  All issues noted in this document were discussed and addressed.  A limited physical exam was performed with this format.  Please refer to the patient's chart for his consent to telehealth for [facility].   Date:  11/09/2018   ID:  Thomas Washington, DOB 11/18/1950, MRN [id number]  Patient Location: Home Provider Location: Home  PCP:  Baxley, Mary J, MD  Cardiologist:   Nahser  Electrophysiologist:  None   Evaluation Performed:  New Patient Evaluation  Chief Complaint:  Elevated coronary calcium score   Nov 09, 2018    Thomas Washington is a 68 y.o. male  ( husband of Anne Marie Mazzochi ) with markedly elevated coronary calcium score.  He also has a history of diabetes mellitus.  I saw Dhyan in 2014 for acute pericarditis .  He recently had a coronary calcium score of 2219.  This is a 97th percentile for age and sex matched controls.  The CT scan also revealed mild to moderate dilatation of the ascending aorta.  The aorta measured 4.5 cm.  Hx of hyperlipidemia,  DM ( for15-16 years) A1C is 6.5  Has mild DOE,  Moving boxes.   Last for a second or so  Does not exercise, active doing some yard work Strong family hx of premature CAD .  Father died at age 39 of MI .   Pt has had progressive DOE for the past several years.   Symptoms seem to have worsened over the past several months.    [place] but now gets short of breath moving boxes  No real CP but this dyspnea could be his angina equivalent.   The patient does  not have symptoms concerning for COVID-19 infection (fever, chills, cough, or new shortness of breath).    Past Medical History:  Diagnosis Date  . Diabetes mellitus without complication (HCC)   . Hypercholesteremia   . MRSA (methicillin resistant staph aureus) culture positive    Past Surgical History:  Procedure Laterality Date  . VASECTOMY    . WISDOM TOOTH EXTRACTION       Current Meds  Medication Sig  . ACCU-CHEK FASTCLIX LANCETS MISC Use as instructed to test blood sugar daily  . aspirin EC 81 MG tablet Take 81 mg by mouth daily.  . atorvastatin (LIPITOR) 80 MG tablet TAKE 1 TABLET BY MOUTH EVERY DAY  . B Complex Vitamins (B COMPLEX PO) Take by mouth.  . Blood Glucose Monitoring Suppl (ACCU-CHEK AVIVA PLUS) w/Device KIT Use as instructed to test blood sugar daily  . Cholecalciferol (VITAMIN D) 2000 UNITS tablet Take 10,000 Units by mouth daily.   . Coenzyme Q10 (CO Q 10) 100 MG CAPS Take 100 mg by mouth daily.  . Cyanocobalamin (VITAMIN B 12 PO) Take by mouth daily.   . folic acid (FOLVITE) 1 MG tablet Take 800 mg by mouth daily.   . glucose blood (ACCU-CHEK ACTIVE STRIPS) test strip Use as instructed to test blood sugar daily  . JANUVIA 100 MG tablet TAKE 1 TABLET BY MOUTH   EVERY DAY  . metFORMIN (GLUCOPHAGE) 1000 MG tablet TAKE 1 TABLET BY MOUTH TWICE A DAY WITH A MEAL (Patient taking differently: Take 500-1,000 mg by mouth 2 (two) times daily with a meal. 500 in am, 1000 pm)  . tamsulosin (FLOMAX) 0.4 MG CAPS capsule Take 1 capsule (0.4 mg total) by mouth daily after supper. Until stone passed     Allergies:   Patient has no known allergies.   Social History   Tobacco Use  . Smoking status: Never Smoker  . Smokeless tobacco: Never Used  Substance Use Topics  . Alcohol use: Yes    Comment: 1 bottle of wine monthly + 2 beers  . Drug use: No     Family Hx: The patient's family history includes Healthy in his daughter and sister; Heart attack in his father; Stroke in  his mother. There is no history of Colon cancer.  ROS:   Please see the history of present illness.     All other systems reviewed and are negative.   Prior CV studies:   The following studies were reviewed today:    Labs/Other Tests and Data Reviewed:    EKG:    Feb, 4, 2020 :  NSR at 70,   No ST or T wave changes.   Recent Labs: 07/13/2018: ALT 25; BUN 10; Creat 1.03; Hemoglobin 15.6; Platelets 271; Potassium 4.6; Sodium 141   Recent Lipid Panel Lab Results  Component Value Date/Time   CHOL 158 07/13/2018 09:06 AM   TRIG 83 07/13/2018 09:06 AM   HDL 51 07/13/2018 09:06 AM   CHOLHDL 3.1 07/13/2018 09:06 AM   LDLCALC 90 07/13/2018 09:06 AM    Wt Readings from Last 3 Encounters:  11/09/18 185 lb (83.9 kg)  07/17/18 193 lb (87.5 kg)  01/26/18 193 lb (87.5 kg)     Objective:    Vital Signs:  BP 124/74   Pulse 70   Ht 5' 7" (1.702 m)   Wt 185 lb (83.9 kg)   BMI 28.98 kg/m    VITAL SIGNS:  reviewed GEN:  no acute distress EYES:  sclerae anicteric, EOMI - Extraocular Movements Intact RESPIRATORY:  normal respiratory effort, symmetric expansion CARDIOVASCULAR:  no peripheral edema SKIN:  no rash, lesions or ulcers. MUSCULOSKELETAL:  no obvious deformities. NEURO:  alert and oriented x 3, no obvious focal deficit PSYCH:  normal affect  ASSESSMENT & PLAN:    1. CAD :  Crispin presents for further evaluation of marked coronary artery calcification.   He has had DM for years.   Was recently referred for cor. Calcium score by Dr. Baxley.   His calcium score is 2219 which is 97th percentil of age, sex matched controls. He has had some progressive DOE that has been present for years but has worsened over the past several months.   We discussed doing a myoview.  He has severe calcification of all 3 coronaries so the likelyhood of a negative myoview study due to balanced ischemia is high.  I think that a cardiac cath is our best next step.  We have discussd risks, benefits,  options with Kaeden and his wife [name]. They understand and agree to proceed.   2.  Hyperlipidemia;   LDL is 90.  Given his markedly elevated coronary calcium score, I think his LDL should be 70 or perhaps even lower.  We will discontinue the atorvastatin and start him on rosuvastatin 40 mg a day.  I would have a low threshold to add   additional medications perhaps try a PCSK9 inhibitor if we do not get his cholesterol levels to goal.  COVID-19 Education: The signs and symptoms of COVID-19 were discussed with the patient and how to seek care for testing (follow up with PCP or arrange E-visit).  The importance of social distancing was discussed today.  Time:   Today, I have spent  45  minutes with the patient with telehealth technology discussing the above problems.     Medication Adjustments/Labs and Tests Ordered: Current medicines are reviewed at length with the patient today.  Concerns regarding medicines are outlined above.   Tests Ordered: No orders of the defined types were placed in this encounter.   Medication Changes: No orders of the defined types were placed in this encounter.   Disposition:  Follow up in 3 month(s)  Signed, Philip Nahser, MD  11/09/2018 10:11 AM    [facility]  

## 2018-11-09 NOTE — Patient Instructions (Addendum)
Medication Instructions:  Your physician has recommended you make the following change in your medication:  STOP Atorvastatin START Rosuvastatin 40 mg once daily START Nitroglycerin 0.4 mg under tongue as needed for chest pain/shortness of breath  If you need a refill on your cardiac medications before your next appointment, please call your pharmacy.    Follow-Up: Your physician recommends that you schedule a follow-up appointment in: TBD after cath   You are scheduled for a Cardiac Catheterization on Wednesday, June 3 with Dr. Larae Grooms.  1. Please arrive at the [facility] (Main Entrance A) at [facility]: [address] [place], [place] [address] at 6:30 AM (This time is two hours before your procedure to ensure your preparation). Free valet parking service is available.   Special note: Every effort is made to have your procedure done on time. Please understand that emergencies sometimes delay scheduled procedures.  2. Diet: Do not eat solid foods after midnight.  The patient may have clear liquids until 5am upon the day of the procedure.  3. Labs: You will need to have blood drawn on Friday, May 29 at [facility]. [address]  Open: 7:30am - 5pm    Phone: [phone]. You do not need to be fasting.  4. Medication instructions in preparation for your procedure:   Contrast Allergy: No  Do not take Diabetes Med Glucophage (Metformin) on the day of the procedure and HOLD 48 HOURS AFTER THE PROCEDURE.  On the morning of your procedure, take your Aspirin and any morning medicines NOT listed above.  You may use sips of water.  5. Plan for one night stay--bring personal belongings. 6. Bring a current list of your medications and current insurance cards. 7. You MUST have a responsible person to drive you home. 8. Someone MUST be with you the first 24 hours after you arrive home or your discharge will be delayed. 9. Please  wear clothes that are easy to get on and off and wear slip-on shoes.  Thank you for allowing [place] to care for you!   -- [facility]

## 2018-11-09 NOTE — Progress Notes (Signed)
Virtual Visit via Video Note   This visit type was conducted due to national recommendations for restrictions regarding the COVID-19 Pandemic (e.g. social distancing) in an effort to limit this patient's exposure and mitigate transmission in our community.  Due to his co-morbid illnesses, this patient is at least at moderate risk for complications without adequate follow up.  This format is felt to be most appropriate for this patient at this time.  All issues noted in this document were discussed and addressed.  A limited physical exam was performed with this format.  Please refer to the patient's chart for his consent to telehealth for [facility].   Date:  11/09/2018   ID:  Thomas Washington, DOB [birth year], MRN [id number]  Patient Location: Home Provider Location: Home  PCP:  Thomas Showers, MD  Cardiologist:   Thomas Washington  Electrophysiologist:  None   Evaluation Performed:  New Patient Evaluation  Chief Complaint:  Elevated coronary calcium score   Nov 09, 2018    Thomas Washington is a 68 y.o. male  ( husband of Thomas Washington ) with markedly elevated coronary calcium score.  He also has a history of diabetes mellitus.  I saw Thomas Washington in 2014 for acute pericarditis .  He recently had a coronary calcium score of 2219.  This is a 97th percentile for age and sex matched controls.  The CT scan also revealed mild to moderate dilatation of the ascending aorta.  The aorta measured 4.5 cm.  Hx of hyperlipidemia,  DM ( for15-16 years) A1C is 6.5  Has mild DOE,  Moving boxes.   Last for a second or so  Does not exercise, active doing some yard work Strong family hx of premature CAD .  Father died at age 46 of MI .   Pt has had progressive DOE for the past several years.   Symptoms seem to have worsened over the past several months.    [place] but now gets short of breath moving boxes  No real CP but this dyspnea could be his angina equivalent.   The patient does  not have symptoms concerning for COVID-19 infection (fever, chills, cough, or new shortness of breath).    Past Medical History:  Diagnosis Date  . Diabetes mellitus without complication ([place])   . Hypercholesteremia   . MRSA (methicillin resistant staph aureus) culture positive    Past Surgical History:  Procedure Laterality Date  . VASECTOMY    . WISDOM TOOTH EXTRACTION       Current Meds  Medication Sig  . ACCU-CHEK FASTCLIX LANCETS MISC Use as instructed to test blood sugar daily  . aspirin EC 81 MG tablet Take 81 mg by mouth daily.  Marland Kitchen atorvastatin (LIPITOR) 80 MG tablet TAKE 1 TABLET BY MOUTH EVERY DAY  . B Complex Vitamins (B COMPLEX PO) Take by mouth.  . Blood Glucose Monitoring Suppl (ACCU-CHEK AVIVA PLUS) w/Device KIT Use as instructed to test blood sugar daily  . Cholecalciferol (VITAMIN D) 2000 UNITS tablet Take 10,000 Units by mouth daily.   . Coenzyme Q10 (CO Q 10) 100 MG CAPS Take 100 mg by mouth daily.  . Cyanocobalamin (VITAMIN B 12 PO) Take by mouth daily.   . folic acid (FOLVITE) 1 MG tablet Take 800 mg by mouth daily.   Marland Kitchen glucose blood (ACCU-CHEK ACTIVE STRIPS) test strip Use as instructed to test blood sugar daily  . JANUVIA 100 MG tablet TAKE 1 TABLET BY MOUTH  EVERY DAY  . metFORMIN (GLUCOPHAGE) 1000 MG tablet TAKE 1 TABLET BY MOUTH TWICE A DAY WITH A MEAL (Patient taking differently: Take 500-1,000 mg by mouth 2 (two) times daily with a meal. 500 in am, 1000 pm)  . tamsulosin (FLOMAX) 0.4 MG CAPS capsule Take 1 capsule (0.4 mg total) by mouth daily after supper. Until stone passed     Allergies:   Patient has no known allergies.   Social History   Tobacco Use  . Smoking status: Never Smoker  . Smokeless tobacco: Never Used  Substance Use Topics  . Alcohol use: Yes    Comment: 1 bottle of wine monthly + 2 beers  . Drug use: No     Family Hx: The patient's family history includes Healthy in his daughter and sister; Heart attack in his father; Stroke in  his mother. There is no history of Colon cancer.  ROS:   Please see the history of present illness.     All other systems reviewed and are negative.   Prior CV studies:   The following studies were reviewed today:    Labs/Other Tests and Data Reviewed:    EKG:    Feb, 4, 2020 :  NSR at 70,   No ST or T wave changes.   Recent Labs: 07/13/2018: ALT 25; BUN 10; Creat 1.03; Hemoglobin 15.6; Platelets 271; Potassium 4.6; Sodium 141   Recent Lipid Panel Lab Results  Component Value Date/Time   CHOL 158 07/13/2018 09:06 AM   TRIG 83 07/13/2018 09:06 AM   HDL 51 07/13/2018 09:06 AM   CHOLHDL 3.1 07/13/2018 09:06 AM   LDLCALC 90 07/13/2018 09:06 AM    Wt Readings from Last 3 Encounters:  11/09/18 185 lb (83.9 kg)  07/17/18 193 lb (87.5 kg)  01/26/18 193 lb (87.5 kg)     Objective:    Vital Signs:  BP 124/74   Pulse 70   Ht _0  (1.702 m)   Wt 185 lb (83.9 kg)   BMI 28.98 kg/m    VITAL SIGNS:  reviewed GEN:  no acute distress EYES:  sclerae anicteric, EOMI - Extraocular Movements Intact RESPIRATORY:  normal respiratory effort, symmetric expansion CARDIOVASCULAR:  no peripheral edema SKIN:  no rash, lesions or ulcers. MUSCULOSKELETAL:  no obvious deformities. NEURO:  alert and oriented x 3, no obvious focal deficit PSYCH:  normal affect  ASSESSMENT & PLAN:    1. CAD :  Thomas Washington presents for further evaluation of marked coronary artery calcification.   He has had DM for years.   Was recently referred for cor. Calcium score by Dr. Renold Washington.   His calcium score is 2219 which is 97th percentil of age, sex matched controls. He has had some progressive DOE that has been present for years but has worsened over the past several months.   We discussed doing a myoview.  He has severe calcification of all 3 coronaries so the likelyhood of a negative myoview study due to balanced ischemia is high.  I think that a cardiac cath is our best next step.  We have discussd risks, benefits,  options with Thomas Washington and his wife Thomas Washington. They understand and agree to proceed.   2.  Hyperlipidemia;   LDL is 90.  Given his markedly elevated coronary calcium score, I think his LDL should be 70 or perhaps even lower.  We will discontinue the atorvastatin and start him on rosuvastatin 40 mg a day.  I would have a low threshold to add  additional medications perhaps try a PCSK9 inhibitor if we do not get his cholesterol levels to goal.  COVID-19 Education: The signs and symptoms of COVID-19 were discussed with the patient and how to seek care for testing (follow up with PCP or arrange E-visit).  The importance of social distancing was discussed today.  Time:   Today, I have spent  45  minutes with the patient with telehealth technology discussing the above problems.     Medication Adjustments/Labs and Tests Ordered: Current medicines are reviewed at length with the patient today.  Concerns regarding medicines are outlined above.   Tests Ordered: No orders of the defined types were placed in this encounter.   Medication Changes: No orders of the defined types were placed in this encounter.   Disposition:  Follow up in 3 month(s)  Signed, Mertie Moores, MD  11/09/2018 10:11 AM    [facility]

## 2018-11-10 LAB — CBC
Hematocrit: 49.9 % (ref 37.5–51.0)
Hemoglobin: 16 g/dL (ref 13.0–17.7)
MCH: 29.1 pg (ref 26.6–33.0)
MCHC: 32.1 g/dL (ref 31.5–35.7)
MCV: 91 fL (ref 79–97)
Platelets: 258 10*3/uL (ref 150–450)
RBC: 5.5 x10E6/uL (ref 4.14–5.80)
RDW: 14 % (ref 11.6–15.4)
WBC: 7.9 10*3/uL (ref 3.4–10.8)

## 2018-11-10 LAB — BASIC METABOLIC PANEL
BUN/Creatinine Ratio: 9 — ABNORMAL LOW (ref 10–24)
BUN: 9 mg/dL (ref 8–27)
CO2: 25 mmol/L (ref 20–29)
Calcium: 10.2 mg/dL (ref 8.6–10.2)
Chloride: 100 mmol/L (ref 96–106)
Creatinine, Ser: 1.03 mg/dL (ref 0.76–1.27)
GFR calc Af Amer: 87 mL/min/{1.73_m2} (ref >59)
GFR calc non Af Amer: 75 mL/min/{1.73_m2} (ref >59)
Glucose: 145 mg/dL — ABNORMAL HIGH (ref 65–99)
Potassium: 4.6 mmol/L (ref 3.5–5.2)
Sodium: 142 mmol/L (ref 134–144)

## 2018-11-10 LAB — NOVEL CORONAVIRUS, NAA (HOSP ORDER, SEND-OUT TO REF LAB; TAT 18-24 HRS): SARS-CoV-2, NAA: NOT DETECTED

## 2018-11-12 DIAGNOSIS — I2510 Atherosclerotic heart disease of native coronary artery without angina pectoris: Secondary | ICD-10-CM | POA: Insufficient documentation

## 2018-11-13 ENCOUNTER — Telehealth: Payer: Self-pay | Admitting: *Deleted

## 2018-11-13 NOTE — Telephone Encounter (Addendum)
Pt contacted pre-catheterization scheduled at [facility] for: Wednesday November 14, 2018 8:30 AM Verified arrival time and place: [place] Entrance A at: 6;30 AM  Covid-19 test date: 11/09/18 not detected  No solid food after midnight prior to cath, clear liquids until 5 AM day of procedure. Contrast allergy: no  Hold: Metformin-day of procedure and 48 hours post procedure.   Except hold medications AM meds can be  taken pre-cath with sip of water including: ASA 81 mg   Confirmed patient has responsible person to drive home post procedure and observe 24 hours after arriving home:    Patient advised due to Covid-19 pandemic no visitors are allowed in the hospital (unless cognitive impairment).  Their designated party will be called when their procedure is over for an update and to arrange pick up.  Patient advised patients are required to wear a mask when they enter the hospital.      COVID-19 Pre-Screening Questions:  . In the past 7 to 10 days have you had a cough,  shortness of breath, headache, congestion, fever (100 or greater) body aches, chills, sore throat, or sudden loss of taste or sense of smell? No . Have you been around anyone with known Covid 19? No . Have you been around anyone who is awaiting Covid 19 test results in the past 7 to 10 days? No . Have you been around anyone who has been exposed to Covid 19, or has mentioned symptoms of Covid 19 within the past 7 to 10 days? No  I reviewed procedure, mask,visitor instructions and Covid 19 screening questions with patient, he verbalized understanding.

## 2018-11-14 ENCOUNTER — Ambulatory Visit (HOSPITAL_COMMUNITY)
Admission: RE | Admit: 2018-11-14 | Discharge: 2018-11-14 | Disposition: A | Discharge status: Home / Self Care | Payer: Medicare HMO | Attending: Interventional Cardiology | Admitting: Interventional Cardiology

## 2018-11-14 ENCOUNTER — Telehealth: Payer: Self-pay | Admitting: Cardiovascular Disease

## 2018-11-14 ENCOUNTER — Other Ambulatory Visit: Payer: Self-pay

## 2018-11-14 ENCOUNTER — Encounter (HOSPITAL_COMMUNITY)
Admission: RE | Disposition: A | Discharge status: Home / Self Care | Payer: Medicare HMO | Source: Home / Self Care | Attending: Interventional Cardiology

## 2018-11-14 ENCOUNTER — Encounter (HOSPITAL_COMMUNITY): Payer: Self-pay | Admitting: Interventional Cardiology

## 2018-11-14 DIAGNOSIS — Z9852 Vasectomy status: Secondary | ICD-10-CM | POA: Insufficient documentation

## 2018-11-14 DIAGNOSIS — I25119 Atherosclerotic heart disease of native coronary artery with unspecified angina pectoris: Secondary | ICD-10-CM | POA: Diagnosis not present

## 2018-11-14 DIAGNOSIS — Z823 Family history of stroke: Secondary | ICD-10-CM | POA: Diagnosis not present

## 2018-11-14 DIAGNOSIS — I2582 Chronic total occlusion of coronary artery: Secondary | ICD-10-CM | POA: Diagnosis not present

## 2018-11-14 DIAGNOSIS — Z8249 Family history of ischemic heart disease and other diseases of the circulatory system: Secondary | ICD-10-CM | POA: Diagnosis not present

## 2018-11-14 DIAGNOSIS — Z7982 Long term (current) use of aspirin: Secondary | ICD-10-CM | POA: Insufficient documentation

## 2018-11-14 DIAGNOSIS — Z8614 Personal history of Methicillin resistant Staphylococcus aureus infection: Secondary | ICD-10-CM | POA: Insufficient documentation

## 2018-11-14 DIAGNOSIS — E119 Type 2 diabetes mellitus without complications: Secondary | ICD-10-CM | POA: Diagnosis not present

## 2018-11-14 DIAGNOSIS — Z79899 Other long term (current) drug therapy: Secondary | ICD-10-CM | POA: Diagnosis not present

## 2018-11-14 DIAGNOSIS — E785 Hyperlipidemia, unspecified: Secondary | ICD-10-CM | POA: Diagnosis not present

## 2018-11-14 DIAGNOSIS — I209 Angina pectoris, unspecified: Secondary | ICD-10-CM | POA: Diagnosis present

## 2018-11-14 DIAGNOSIS — Z7984 Long term (current) use of oral hypoglycemic drugs: Secondary | ICD-10-CM | POA: Diagnosis not present

## 2018-11-14 DIAGNOSIS — R0609 Other forms of dyspnea: Secondary | ICD-10-CM | POA: Diagnosis not present

## 2018-11-14 HISTORY — PX: LEFT HEART CATH AND CORONARY ANGIOGRAPHY: CATH118249

## 2018-11-14 LAB — GLUCOSE, CAPILLARY: Glucose-Capillary: 137 mg/dL — ABNORMAL HIGH (ref 70–99)

## 2018-11-14 SURGERY — LEFT HEART CATH AND CORONARY ANGIOGRAPHY
Anesthesia: LOCAL

## 2018-11-14 MED ORDER — HEPARIN (PORCINE) IN NACL 1000-0.9 UT/500ML-% IV SOLN
INTRAVENOUS | Status: AC
  Filled 2018-11-14: qty 1000

## 2018-11-14 MED ORDER — HEPARIN SODIUM (PORCINE) 1000 UNIT/ML IJ SOLN
INTRAMUSCULAR | Status: DC | PRN
  Administered 2018-11-14: 4000 [IU] via INTRAVENOUS

## 2018-11-14 MED ORDER — SODIUM CHLORIDE 0.9 % WEIGHT BASED INFUSION
3.0000 mL/kg/h | INTRAVENOUS | Status: AC
  Administered 2018-11-14: 3 mL/kg/h via INTRAVENOUS

## 2018-11-14 MED ORDER — HEPARIN (PORCINE) IN NACL 1000-0.9 UT/500ML-% IV SOLN
INTRAVENOUS | Status: DC | PRN
  Administered 2018-11-14 (×2): 500 mL

## 2018-11-14 MED ORDER — VERAPAMIL HCL 2.5 MG/ML IV SOLN
INTRAVENOUS | Status: DC | PRN
  Administered 2018-11-14: 09:00:00 via INTRA_ARTERIAL

## 2018-11-14 MED ORDER — HEPARIN SODIUM (PORCINE) 1000 UNIT/ML IJ SOLN
INTRAMUSCULAR | Status: AC
  Filled 2018-11-14: qty 1

## 2018-11-14 MED ORDER — SODIUM CHLORIDE 0.9 % IV SOLN: 250.0000 mL | INTRAVENOUS | Status: DC | PRN

## 2018-11-14 MED ORDER — IOHEXOL 350 MG/ML SOLN
INTRAVENOUS | Status: DC | PRN
  Administered 2018-11-14: 70 mL via INTRAVENOUS

## 2018-11-14 MED ORDER — VERAPAMIL HCL 2.5 MG/ML IV SOLN
INTRAVENOUS | Status: AC
  Filled 2018-11-14: qty 2

## 2018-11-14 MED ORDER — MIDAZOLAM HCL 2 MG/2ML IJ SOLN
INTRAMUSCULAR | Status: AC
  Filled 2018-11-14: qty 2

## 2018-11-14 MED ORDER — LABETALOL HCL 5 MG/ML IV SOLN: 10.0000 mg | INTRAVENOUS | Status: DC | PRN

## 2018-11-14 MED ORDER — TICAGRELOR 90 MG PO TABS
ORAL_TABLET | ORAL | Status: AC
  Filled 2018-11-14: qty 2

## 2018-11-14 MED ORDER — METFORMIN HCL 1000 MG PO TABS: 500.0000 mg | ORAL_TABLET | ORAL | Status: AC

## 2018-11-14 MED ORDER — NITROGLYCERIN IN D5W 200-5 MCG/ML-% IV SOLN
INTRAVENOUS | Status: AC
  Filled 2018-11-14: qty 250

## 2018-11-14 MED ORDER — FENTANYL CITRATE (PF) 100 MCG/2ML IJ SOLN
INTRAMUSCULAR | Status: AC
  Filled 2018-11-14: qty 2

## 2018-11-14 MED ORDER — LIDOCAINE HCL (PF) 1 % IJ SOLN
INTRAMUSCULAR | Status: AC
  Filled 2018-11-14: qty 30

## 2018-11-14 MED ORDER — SODIUM CHLORIDE 0.9% FLUSH: 3.0000 mL | INTRAVENOUS | Status: DC | PRN

## 2018-11-14 MED ORDER — ASPIRIN 81 MG PO CHEW: 81.0000 mg | CHEWABLE_TABLET | ORAL | Status: DC

## 2018-11-14 MED ORDER — SODIUM CHLORIDE 0.9 % WEIGHT BASED INFUSION: 1.0000 mL/kg/h | INTRAVENOUS | Status: DC

## 2018-11-14 MED ORDER — LIDOCAINE HCL (PF) 1 % IJ SOLN
INTRAMUSCULAR | Status: DC | PRN
  Administered 2018-11-14: 2 mL

## 2018-11-14 MED ORDER — SODIUM CHLORIDE 0.9% FLUSH: 3.0000 mL | Freq: Two times a day (BID) | INTRAVENOUS | Status: DC

## 2018-11-14 MED ORDER — ATROPINE SULFATE 1 MG/10ML IJ SOSY
PREFILLED_SYRINGE | INTRAMUSCULAR | Status: AC
  Filled 2018-11-14: qty 10

## 2018-11-14 MED ORDER — FENTANYL CITRATE (PF) 100 MCG/2ML IJ SOLN
INTRAMUSCULAR | Status: DC | PRN
  Administered 2018-11-14: 25 ug via INTRAVENOUS

## 2018-11-14 MED ORDER — ACETAMINOPHEN 325 MG PO TABS: 650.0000 mg | ORAL_TABLET | ORAL | Status: DC | PRN

## 2018-11-14 MED ORDER — MIDAZOLAM HCL 2 MG/2ML IJ SOLN
INTRAMUSCULAR | Status: DC | PRN
  Administered 2018-11-14: 2 mg via INTRAVENOUS

## 2018-11-14 MED ORDER — HYDRALAZINE HCL 20 MG/ML IJ SOLN: 10.0000 mg | INTRAMUSCULAR | Status: DC | PRN

## 2018-11-14 MED ORDER — ONDANSETRON HCL 4 MG/2ML IJ SOLN: 4.0000 mg | Freq: Four times a day (QID) | INTRAMUSCULAR | Status: DC | PRN

## 2018-11-14 MED ORDER — SODIUM CHLORIDE 0.9 % IV SOLN: INTRAVENOUS | Status: DC

## 2018-11-14 SURGICAL SUPPLY — 9 items
CATH 5FR JL3.5 JR4 ANG PIG MP (CATHETERS) ×2 IMPLANT
DEVICE RAD COMP TR BAND LRG (VASCULAR PRODUCTS) ×2 IMPLANT
GLIDESHEATH SLEND SS 6F .021 (SHEATH) ×2 IMPLANT
GUIDEWIRE INQWIRE 1.5J.035X260 (WIRE) ×1 IMPLANT
INQWIRE 1.5J .035X260CM (WIRE) ×2
KIT HEART LEFT (KITS) ×2 IMPLANT
PACK CARDIAC CATHETERIZATION (CUSTOM PROCEDURE TRAY) ×2 IMPLANT
TRANSDUCER W/STOPCOCK (MISCELLANEOUS) ×2 IMPLANT
TUBING CIL FLEX 10 FLL-RA (TUBING) ×2 IMPLANT

## 2018-11-14 NOTE — Discharge Instructions (Signed)
HOLD METFORMIN FOR A FULL 48 HOURS AFTER DISCHARGE.   DRINK PLENTY OF FLUIDS FOR THE NEXT 2-3 DAYS TO KEEP HYDRATED.  Radial Site Care  This sheet gives you information about how to care for yourself after your procedure. Your health care provider may also give you more specific instructions. If you have problems or questions, contact your health care provider. What can I expect after the procedure? After the procedure, it is common to have:  Bruising and tenderness at the catheter insertion area. Follow these instructions at home: Medicines  Take over-the-counter and prescription medicines only as told by your health care provider. Insertion site care  Follow instructions from your health care provider about how to take care of your insertion site. Make sure you: ? Wash your hands with soap and water before you change your bandage (dressing). If soap and water are not available, use hand sanitizer. ? Change your dressing as told by your health care provider. ? Leave stitches (sutures), skin glue, or adhesive strips in place. These skin closures may need to stay in place for 2 weeks or longer. If adhesive strip edges start to loosen and curl up, you may trim the loose edges. Do not remove adhesive strips completely unless your health care provider tells you to do that.  Check your insertion site every day for signs of infection. Check for: ? Redness, swelling, or pain. ? Fluid or blood. ? Pus or a bad smell. ? Warmth.  Do not take baths, swim, or use a hot tub until your health care provider approves.  You may shower 24-48 hours after the procedure, or as directed by your health care provider. ? Remove the dressing and gently wash the site with plain soap and water. ? Pat the area dry with a clean towel. ? Do not rub the site. That could cause bleeding.  Do not apply powder or lotion to the site. Activity   For 24 hours after the procedure, or as directed by your health  care provider: ? Do not flex or bend the affected arm. ? Do not push or pull heavy objects with the affected arm. ? Do not drive yourself home from the hospital or clinic. You may drive 24 hours after the procedure unless your health care provider tells you not to. ? Do not operate machinery or power tools.  Do not lift anything that is heavier than 10 lb (4.5 kg), or the limit that you are told, until your health care provider says that it is safe.  Ask your health care provider when it is okay to: ? Return to work or school. ? Resume usual physical activities or sports. ? Resume sexual activity. General instructions  If the catheter site starts to bleed, raise your arm and put firm pressure on the site. If the bleeding does not stop, get help right away. This is a medical emergency.  If you went home on the same day as your procedure, a responsible adult should be with you for the first 24 hours after you arrive home.  Keep all follow-up visits as told by your health care provider. This is important. Contact a health care provider if:  You have a fever.  You have redness, swelling, or yellow drainage around your insertion site. Get help right away if:  You have unusual pain at the radial site.  The catheter insertion area swells very fast.  The insertion area is bleeding, and the bleeding does not stop when  you hold steady pressure on the area.  Your arm or hand becomes pale, cool, tingly, or numb. These symptoms may represent a serious problem that is an emergency. Do not wait to see if the symptoms will go away. Get medical help right away. Call your local emergency services (911 in the [place] Do not drive yourself to the hospital. Summary  After the procedure, it is common to have bruising and tenderness at the site.  Follow instructions from your health care provider about how to take care of your radial site wound. Check the wound every day for signs of infection.  Do  not lift anything that is heavier than 10 lb (4.5 kg), or the limit that you are told, until your health care provider says that it is safe. This information is not intended to replace advice given to you by your health care provider. Make sure you discuss any questions you have with your health care provider. Document Released: 07/02/2010 Document Revised: 07/05/2017 Document Reviewed: 07/05/2017 Elsevier Interactive Patient Education  2019 [employer].

## 2018-11-14 NOTE — Progress Notes (Signed)
[facility] consulted for CABG evaluation. °

## 2018-11-14 NOTE — Telephone Encounter (Signed)
New Message           Patient is calling to check the status on the appointment with the surgeon, they were told that they would get a call today. Pls advise.

## 2018-11-14 NOTE — Interval H&P Note (Signed)
Cath Lab Visit (complete for each Cath Lab visit)  Clinical Evaluation Leading to the Procedure:   ACS: No.  Non-ACS:    Anginal Classification: CCS III  Anti-ischemic medical therapy: Minimal Therapy (1 class of medications)  Non-Invasive Test Results: High-risk stress test findings: cardiac mortality >3%/year CT with high coronary scores  Prior CABG: No previous CABG      History and Physical Interval Note:  11/14/2018 8:23 AM  Thomas Washington  has presented today for surgery, with the diagnosis of Angina.  The various methods of treatment have been discussed with the patient and family. After consideration of risks, benefits and other options for treatment, the patient has consented to  Procedure(s): LEFT HEART CATH AND CORONARY ANGIOGRAPHY (N/A) as a surgical intervention.  The patient's history has been reviewed, patient examined, no change in status, stable for surgery.  I have reviewed the patient's chart and labs.  Questions were answered to the patient's satisfaction.     Larae Grooms

## 2018-11-15 ENCOUNTER — Other Ambulatory Visit: Payer: Self-pay

## 2018-11-15 ENCOUNTER — Other Ambulatory Visit: Payer: Self-pay | Admitting: *Deleted

## 2018-11-15 DIAGNOSIS — I10 Essential (primary) hypertension: Secondary | ICD-10-CM

## 2018-11-15 DIAGNOSIS — I2584 Coronary atherosclerosis due to calcified coronary lesion: Secondary | ICD-10-CM

## 2018-11-15 DIAGNOSIS — I2510 Atherosclerotic heart disease of native coronary artery without angina pectoris: Secondary | ICD-10-CM

## 2018-11-15 NOTE — Telephone Encounter (Signed)
Patient has an appointment with [name] with [facility] on 6/5

## 2018-11-16 ENCOUNTER — Ambulatory Visit (HOSPITAL_COMMUNITY)
Admission: RE | Admit: 2018-11-16 | Discharge: 2018-11-16 | Disposition: A | Discharge status: Home / Self Care | Payer: Medicare HMO | Source: Ambulatory Visit | Attending: Cardiothoracic Surgery | Admitting: Cardiothoracic Surgery

## 2018-11-16 ENCOUNTER — Institutional Professional Consult (permissible substitution) (INDEPENDENT_AMBULATORY_CARE_PROVIDER_SITE_OTHER): Payer: Medicare HMO | Admitting: Cardiothoracic Surgery

## 2018-11-16 ENCOUNTER — Other Ambulatory Visit: Payer: Self-pay | Admitting: *Deleted

## 2018-11-16 VITALS — BP 148/93 | HR 79 | Temp 97.3°F | Resp 20 | Ht 67.0 in | Wt 184.0 lb

## 2018-11-16 DIAGNOSIS — E785 Hyperlipidemia, unspecified: Secondary | ICD-10-CM | POA: Diagnosis not present

## 2018-11-16 DIAGNOSIS — I2510 Atherosclerotic heart disease of native coronary artery without angina pectoris: Secondary | ICD-10-CM

## 2018-11-16 DIAGNOSIS — I2584 Coronary atherosclerosis due to calcified coronary lesion: Secondary | ICD-10-CM | POA: Diagnosis not present

## 2018-11-16 DIAGNOSIS — I10 Essential (primary) hypertension: Secondary | ICD-10-CM

## 2018-11-16 NOTE — Progress Notes (Signed)
[address]       [place] [address]             [phone]                    Morty C Huelsmann Hoehne Medical Record #[id number] Date of Birth: [birth year]  Referring: Jettie Booze, MD Primary Care: Elby Showers, MD Primary Cardiologist: No primary care provider on file.  Chief Complaint:    Chief Complaint  Patient presents with   Coronary Artery Disease    Surgical eval    History of Present Illness:    Thomas Washington 68 y.o. male is seen in the office  today for at the request of Dr. nausea.  The patient gives history of perhaps a year of vague chest discomfort, he notes more of a pressure, or the sensation of having to suck harder to get enough air.  He denies any specific chest pain.  He has had no previous cardiac history.  He has been diabetic since 2004.  Recently a cardiac calcium score study was done, the patient was referred for cardiac catheterization.  Catheterization was carried out by Dr. Irish Lack and the patient was referred to cardiac surgery.   Patient has a previous history of pericarditis in 2014, no CT scan was done at that point patient did have a echocardiogram done in 2014-report as normal but the formal report could not be located.  Prior to seeing the patient in the office we arrange for follow-up echocardiogram with suggested that the patient had a dilated ascending aorta.  Patient was unaware of this.  He does have a family history of his father who died suddenly at age 49 presumed myocardial infarction His mother died in her 60s of a stroke Patient has 1 sister who is healthy 1 daughter who is healthy Current Activity/ Functional Status:  Patient is independent with mobility/ambulation, transfers, ADL's, IADL's.   Zubrod Score: At the time of surgery this patients most appropriate activity status/level should be described as: '[x]'     0    Normal activity, no symptoms '[]'     1    Restricted in physical strenuous  activity but ambulatory, able to do out light work '[]'     2    Ambulatory and capable of self care, unable to do work activities, up and about               >50 % of waking hours                              '[]'     3    Only limited self care, in bed greater than 50% of waking hours '[]'     4    Completely disabled, no self care, confined to bed or chair '[]'     5    Moribund   Past Medical History:  Diagnosis Date   Diabetes mellitus without complication ([place])    Hypercholesteremia    MRSA (methicillin resistant staph aureus) culture positive     Past Surgical History:  Procedure Laterality Date   LEFT HEART CATH AND CORONARY ANGIOGRAPHY N/A 11/14/2018   Procedure: LEFT HEART CATH AND CORONARY ANGIOGRAPHY;  Surgeon: Jettie Booze, MD;  Location: [facility];  Service: Cardiovascular;  Laterality: N/A;   VASECTOMY     WISDOM TOOTH EXTRACTION  Family History  Problem Relation Age of Onset   Heart attack Father    Stroke Mother    Healthy Sister    Healthy Daughter    Colon cancer Neg Hx      Social History   Tobacco Use  Smoking Status Never Smoker  Smokeless Tobacco Never Used    Social History   Substance and Sexual Activity  Alcohol Use Yes   Comment: 1 bottle of wine monthly + 2 beers     Allergies  Allergen Reactions   Quinolones     Patient was warned about not using Cipro and similar antibiotics. Recent studies have raised concern that fluoroquinolone antibiotics could be associated with an increased risk of aortic aneurysm Fluoroquinolones have non-antimicrobial properties that might jeopardise the integrity of the extracellular matrix of the vascular wall In a  propensity score matched cohort study in [place], there was a 66% increased rate of aortic aneurysm or dissection associated with oral fluoroquinolone use, compared wit    Current Outpatient Medications  Medication Sig Dispense Refill   ACCU-CHEK FASTCLIX LANCETS MISC Use  as instructed to test blood sugar daily 100 each 2   acetaminophen (TYLENOL) 500 MG tablet Take 1,000 mg by mouth every 6 (six) hours as needed for headache.     aspirin EC 81 MG tablet Take 81 mg by mouth daily.     aspirin-acetaminophen-caffeine (EXCEDRIN MIGRAINE) 250-250-65 MG tablet Take 2 tablets by mouth every 6 (six) hours as needed (caffeine related headaches).     B Complex Vitamins (B COMPLEX PO) Take 1 tablet by mouth daily.      Blood Glucose Monitoring Suppl (ACCU-CHEK AVIVA PLUS) w/Device KIT Use as instructed to test blood sugar daily 1 kit 0   calcium carbonate (TUMS - DOSED IN MG ELEMENTAL CALCIUM) 500 MG chewable tablet Chew 2 tablets by mouth daily as needed for indigestion or heartburn.     Cholecalciferol (VITAMIN D3) 250 MCG (10000 UT) capsule Take 10,000 Units by mouth at bedtime.     Coenzyme Q10 (CO Q 10) 100 MG CAPS Take 100 mg by mouth every evening.      Cyanocobalamin (B-12) 5000 MCG CAPS Take 5,000 mcg by mouth at bedtime.     folic acid (FOLVITE) 371 MCG tablet Take 800 mcg by mouth daily.     glucose blood (ACCU-CHEK ACTIVE STRIPS) test strip Use as instructed to test blood sugar daily 100 each 2   ibuprofen (ADVIL) 200 MG tablet Take 400 mg by mouth every 6 (six) hours as needed (muscle pain).     JANUVIA 100 MG tablet TAKE 1 TABLET BY MOUTH EVERY DAY (Patient taking differently: Take 100 mg by mouth every evening. ) 90 tablet 1   metFORMIN (GLUCOPHAGE) 1000 MG tablet Take 0.5-1 tablets (500-1,000 mg total) by mouth See admin instructions. Take 500 mg in the morning and 1000 mg in the evening     nitroGLYCERIN (NITROSTAT) 0.4 MG SL tablet Place 1 tablet (0.4 mg total) under the tongue every 5 (five) minutes as needed for chest pain. 25 tablet 6   rosuvastatin (CRESTOR) 40 MG tablet Take 1 tablet (40 mg total) by mouth daily. (Patient taking differently: Take 40 mg by mouth every evening. ) 30 tablet 11   tamsulosin (FLOMAX) 0.4 MG CAPS capsule Take  1 capsule (0.4 mg total) by mouth daily after supper. Until stone passed (Patient taking differently: Take 0.4 mg by mouth daily. Until stone passed) 15 capsule 0   triamcinolone (  NASACORT ALLERGY 24HR) 55 MCG/ACT AERO nasal inhaler Place 1 spray into the nose daily as needed (allergies).     No current facility-administered medications for this visit.     Pertinent items are noted in HPI.   Review of Systems:     Cardiac Review of Systems: [Y] = yes  or   [ N ] = no   Chest Pain [ n  ]  Resting SOB [ n  ] Exertional SOB  [ y ]  Vertell Limber Florencio.Farrier  ]   Pedal Edema [n   ]    Palpitations Florencio.Farrier  ] Syncope  [ n ]   Presyncope [ n  ]   General Review of Systems: [Y] = yes [  ]=no Constitional: recent weight change [  ];  Wt loss over the last 3 months [   ] anorexia [  ]; fatigue [  ]; nausea [  ]; night sweats [  ]; fever [  ]; or chills [  ];           Eye : blurred vision [  ]; diplopia [   ]; vision changes [  ];  Amaurosis fugax[  ]; Resp: cough [  ];  wheezing[  ];  hemoptysis[  ]; shortness of breath[  ]; paroxysmal nocturnal dyspnea[  ]; dyspnea on exertion[  ]; or orthopnea[  ];  GI:  gallstones[  ], vomiting[  ];  dysphagia[  ]; melena[  ];  hematochezia [  ]; heartburn[  ];   Hx of  Colonoscopy[  ]; GU: kidney stones [  ]; hematuria[  ];   dysuria [  ];  nocturia[  ];  history of     obstruction [  ]; urinary frequency [  ]             Skin: rash, swelling[  ];, hair loss[  ];  peripheral edema[  ];  or itching[  ]; Musculosketetal: myalgias[  ];  joint swelling[  ];  joint erythema[  ];  joint pain[  ];  back pain[  ];  Heme/Lymph: bruising[  ];  bleeding[  ];  anemia[  ];  Neuro: TIA[  ];  headaches[  ];  stroke[  ];  vertigo[  ];  seizures[  ];   paresthesias[  ];  difficulty walking[  ];  Psych:depression[  ]; anxiety[  ];  Endocrine: diabetes[ y ];  thyroid dysfunction[  ];  Immunizations: Flu up to date [  ]; Pneumococcal up to date [  ];  Other:     PHYSICAL EXAMINATION: BP (!)  148/93    Pulse 79    Temp (!) 97.3 F (36.3 C) (Skin)    Resp 20    Ht '5\' 7"'  (1.702 m)    Wt 184 lb (83.5 kg)    SpO2 97% Comment: RA   BMI 28.82 kg/m  General appearance: alert, cooperative and no distress Head: Normocephalic, without obvious abnormality, atraumatic Neck: no adenopathy, no carotid bruit, no JVD, supple, symmetrical, trachea midline and thyroid not enlarged, symmetric, no tenderness/mass/nodules Lymph nodes: Cervical, supraclavicular, and axillary nodes normal. Resp: clear to auscultation bilaterally Back: symmetric, no curvature. ROM normal. No CVA tenderness. Cardio: regular rate and rhythm, S1, S2 normal, no murmur, click, rub or gallop GI: soft, non-tender; bowel sounds normal; no masses,  no organomegaly Extremities: extremities normal, atraumatic, no cyanosis or edema and Homans sign is negative, no sign of DVT Neurologic: Grossly normal  Diagnostic Studies &  Laboratory data:     Recent Radiology Findings:   Ct Cardiac Scoring  Addendum Date: 11/07/2018   ADDENDUM REPORT: 11/07/2018 09:54 CLINICAL DATA:  Risk stratification EXAM: Coronary Calcium Score TECHNIQUE: The patient was scanned on a Siemens Somatom 64 slice scanner. Axial non-contrast 3 mm slices were carried out through the heart. The data set was analyzed on a dedicated work station and scored using the [facility]. FINDINGS: Non-cardiac: See separate report from [facility]. Ascending aorta: Moderately dilated 4.5 cm see recommendations from radiology Pericardium: Normal Coronary arteries: Severe 3 vessel coronary calcification IMPRESSION: Coronary calcium score of 2219. This was 65 th percentile for age and sex matched control. Patient should have f/u stress myovue study given how high calcium score is Note f/u CTA aorta for aneurysm in 6 months Per radiology Jenkins Rouge Electronically Signed   By: Jenkins Rouge M.D.   On: 11/07/2018 09:54   Result Date: 11/07/2018 EXAM: OVER-READ INTERPRETATION   CT CHEST The following report is an over-read performed by radiologist Dr. Rolm Baptise of [facility], [place] on 11/06/2018. This over-read does not include interpretation of cardiac or coronary anatomy or pathology. The coronary calcium score interpretation by the cardiologist is attached. COMPARISON:  Chest x-ray 09/11/2012 FINDINGS: Vascular: Aneurysmal dilatation of the ascending thoracic aorta, 4.5 cm maximally. Calcifications in the aortic root, ascending aorta and aortic arch. Descending thoracic aorta normal caliber. Heart is borderline enlarged. Mediastinum/Nodes: No adenopathy in the lower mediastinum or hila. Lungs/Pleura: Scarring in the lung bases.  No effusions Upper Abdomen: Imaging into the upper abdomen shows no acute findings. Musculoskeletal: Chest wall soft tissues are unremarkable. No acute bony abnormality. IMPRESSION: Aortic atherosclerosis. 4.5 cm ascending thoracic aortic aneurysm. Recommend semi-annual imaging followup by CTA or MRA and referral to cardiothoracic surgery if not already obtained. This recommendation follows 2010 ACCF/AHA/AATS/ACR/ASA/SCA/SCAI/SIR/STS/SVM Guidelines for the Diagnosis and Management of Patients With Thoracic Aortic Disease. Circulation. 2010; 121: U932-T557. Aortic aneurysm NOS (ICD10-I71.9) Electronically Signed: By: Rolm Baptise M.D. On: 11/06/2018 11:57     I have independently reviewed the above radiology studies  and reviewed the findings with the patient.    CATH:11/14/2018 Prox RCA to Mid RCA lesion is 80% stenosed.  LPDA lesion is 75% stenosed.  Ost 2nd Mrg to 2nd Mrg lesion is 100% stenosed.  Mid LAD lesion is 100% stenosed.  Prox LAD to Mid LAD lesion is 75% stenosed.  Prox LAD lesion is 50% stenosed.  Dist LM to Ost LAD lesion is 50% stenosed.  The left ventricular systolic function is normal.  LV end diastolic pressure is normal.  The left ventricular ejection fraction is 55-65% by visual estimate.  There is no aortic  valve stenosis.   Multivessel CAD with CTO of LAD and CTO of OM.    Plan for surgical consult.  Continue aggressive medical therapy.   ECHO: 11/16/2018 ECHOCARDIOGRAM REPORT       Patient Name:   Thomas Washington Date of Exam: 11/16/2018 Medical Rec #:  [id number]       Height:       69.0 in Accession #:    [id number]      Weight:       184.0 lb Date of Birth:  11/29/1950       BSA:          1.99 m Patient Age:    16 years        BP:           145/93  mmHg Patient Gender: M               HR:           55 bpm. Exam Location:  Outpatient    Procedure: 2D Echo, Cardiac Doppler, Color Doppler and Strain Analysis  Indications:    CAD Native Vessel 414.01/ 125.10   History:        Patient has prior history of Echocardiogram examinations, most                 recent 09/13/2012. Acute pericarditis CAD Risk Factors:                 Hypertension, Dyslipidemia and Non-Smoker.   Sonographer:    Paulita Fujita RDCS Referring Phys: Grace Isaac  IMPRESSIONS    1. The left ventricle has normal systolic function with an ejection fraction of 60-65%. The cavity size was normal. Left ventricular diastolic Doppler parameters are consistent with impaired relaxation.  2. The right ventricle has normal systolic function. The cavity was normal. There is no increase in right ventricular wall thickness.  3. There is dilatation of the aortic root measuring 40 mm.  4. There is no evidence of pericardial effusion.  FINDINGS  Left Ventricle: The left ventricle has normal systolic function, with an ejection fraction of 60-65%. The cavity size was normal. There is no increase in left ventricular wall thickness. Left ventricular diastolic Doppler parameters are consistent with  impaired relaxation.  Right Ventricle: The right ventricle has normal systolic function. The cavity was normal. There is no increase in right ventricular wall thickness.  Left Atrium: Left atrial size was normal in  size.  Right Atrium: Right atrial size was normal in size. Right atrial pressure is estimated at 3 mmHg.  Interatrial Septum: No atrial level shunt detected by color flow Doppler.  Pericardium: There is no evidence of pericardial effusion.  Mitral Valve: The mitral valve is normal in structure. Mitral valve regurgitation is mild by color flow Doppler.  Tricuspid Valve: The tricuspid valve is normal in structure. Tricuspid valve regurgitation was not visualized by color flow Doppler.  Aortic Valve: The aortic valve is normal in structure. Aortic valve regurgitation was not visualized by color flow Doppler.  Pulmonic Valve: The pulmonic valve was normal in structure. Pulmonic valve regurgitation is trivial by color flow Doppler.  Aorta: There is dilatation of the aortic root measuring 40 mm.  Venous: The inferior vena cava is normal in size with greater than 50% respiratory variability.    +--------------+--------++  LEFT VENTRICLE                 +----------------+---------++ +--------------+--------++       Diastology                    PLAX 2D                        +----------------+---------++ +--------------+--------++       LV e' lateral:   6.96 cm/s    LVIDd:         4.30 cm         +----------------+---------++ +--------------+--------++       LV E/e' lateral: 9.8          LVIDs:         3.10 cm         +----------------+---------++ +--------------+--------++       LV e' medial:    5.33  cm/s    LV PW:         1.00 cm         +----------------+---------++ +--------------+--------++       LV E/e' medial:  12.8         LV IVS:        1.00 cm         +----------------+---------++ +--------------+--------++  LVOT diam:     1.90 cm         +----------------------+-------++ +--------------+--------++       2D Longitudinal Strain            LV SV:         45 ml           +----------------------+-------++ +--------------+--------++       2D Strain GLS Avg:     -20.5  %    LV SV Index:   22.23           +----------------------+-------++ +--------------+--------++  LVOT Area:     2.84 cm   +--------------+--------++                            +--------------+--------++   +------------------+---------++  LV Volumes (MOD)               +------------------+---------++  LV area d, A2C:    31.00 cm   +------------------+---------++  LV area d, A4C:    29.30 cm   +------------------+---------++  LV area s, A2C:    18.10 cm   +------------------+---------++  LV area s, A4C:    17.00 cm   +------------------+---------++  LV major d, A2C:   7.94 cm     +------------------+---------++  LV major d, A4C:   8.15 cm     +------------------+---------++  LV major s, A2C:   6.67 cm     +------------------+---------++  LV major s, A4C:   6.34 cm     +------------------+---------++  LV vol d, MOD A2C: 100.0 ml    +------------------+---------++  LV vol d, MOD A4C: 86.2 ml     +------------------+---------++  LV vol s, MOD A2C: 41.3 ml     +------------------+---------++  LV vol s, MOD A4C: 38.9 ml     +------------------+---------++  LV SV MOD A2C:     58.7 ml     +------------------+---------++  LV SV MOD A4C:     86.2 ml     +------------------+---------++  LV SV MOD BP:      53.0 ml     +------------------+---------++  +---------------+----------++  RIGHT VENTRICLE              +---------------+----------++  RV S prime:     12.80 cm/s   +---------------+----------++  TAPSE (M-mode): 1.9 cm       +---------------+----------++  +---------------+-------++-----------++  LEFT ATRIUM              Index         +---------------+-------++-----------++  LA diam:        3.90 cm  1.96 cm/m    +---------------+-------++-----------++  LA Vol (A2C):   44.7 ml  22.42 ml/m   +---------------+-------++-----------++  LA Vol (A4C):   31.2 ml  15.65 ml/m   +---------------+-------++-----------++  LA Biplane Vol: 38.3 ml  19.21  ml/m   +---------------+-------++-----------++ +------------+---------++-----------++  RIGHT ATRIUM            Index         +------------+---------++-----------++  RA Area:  12.30 cm                +------------+---------++-----------++  RA Volume:   21.80 ml   10.93 ml/m   +------------+---------++-----------++  +------------+-----------++  AORTIC VALVE               +------------+-----------++  LVOT Vmax:   109.00 cm/s   +------------+-----------++  LVOT Vmean:  77.200 cm/s   +------------+-----------++  LVOT VTI:    0.265 m       +------------+-----------++   +-------------+-------++  AORTA                   +-------------+-------++  Ao Root diam: 3.10 cm   +-------------+-------++  +--------------+----------++  MITRAL VALVE                +--------------+-------+ +--------------+----------++  SHUNTS                   MV Area (PHT): 3.27 cm     +--------------+-------+ +--------------+----------++  Systemic VTI:  0.26 m    MV PHT:        67.28 msec   +--------------+-------+ +--------------+----------++  Systemic Diam: 1.90 cm   MV Decel Time: 232 msec     +--------------+-------+ +--------------+----------++ +--------------+----------++  MV E velocity: 68.10 cm/s   +--------------+----------++  MV A velocity: 75.00 cm/s   +--------------+----------++  MV E/A ratio:  0.91         +--------------+----------++    Candee Furbish MD Electronically signed by Candee Furbish MD Signature Date/Time: 11/16/2018/1:53:03 PM      Recent Lab Findings: Lab Results  Component Value Date   WBC 7.9 11/09/2018   HGB 16.0 11/09/2018   HCT 49.9 11/09/2018   PLT 258 11/09/2018   GLUCOSE 145 (H) 11/09/2018   CHOL 158 07/13/2018   TRIG 83 07/13/2018   HDL 51 07/13/2018   LDLCALC 90 07/13/2018   ALT 25 07/13/2018   AST 23 07/13/2018   NA 142 11/09/2018   K 4.6 11/09/2018   CL 100 11/09/2018   CREATININE 1.03 11/09/2018   BUN 9 11/09/2018   CO2 25 11/09/2018   TSH  3.350 10/31/2014   HGBA1C 6.5 (H) 07/13/2018      Assessment / Plan:   #1 coronary occlusive disease three-vessel and diabetic male, with exertionally related symptoms primarily shortness of breath with exertion. #2 dilated ascending aorta though incompletely characterized by noncontrasted limited CT for calcium scoring purposes and echocardiogram-  #3 diabetes mellitus for 16 years-controlled on oral agents #4 sudden death in his father at age 75 presumed myocardial infarction but not documented  I reviewed the patient's history symptoms cardiac catheterization and echocardiogram.  With the patient's symptoms and severe three-vessel disease specially with 2 of the 3 major vessels occluded I recommended to the patient that we proceed with coronary artery bypass graft.  Prior to this will fully evaluate the size of his aorta with a formal CTA of the chest prior to surgery.  Depending on the findings of the CTA the patient may also require replacement of the ascending aorta.  Tentatively plan for June 10.  I  spent 60 minutes with  the patient face to face and greater then 50% of the time was spent in counseling and coordination of care.    Grace Isaac MD      [address] [place] [address] Office [phone]   Beeper [phone]  11/19/2018 5:50 PM

## 2018-11-16 NOTE — Patient Instructions (Addendum)
Ascending Aortic Aneurysm/ Thoracic Aortic Aneurysm   Recent studies have raised concern that fluoroquinolone antibiotics could be associated with an increased risk of aortic aneurysm or aortic dissection. You should avoid use of Cipro and other associated antibiotics (flouroquinolone antibiotics )  It is  best to avoid activities that cause grunting or straining (medically referred to as a "valsalva maneuver"). This happens when a person bears down against a closed throat to increase the strength of arm or abdominal muscles. There's often a tendency to do this when lifting heavy weights, doing sit-ups, push-ups or chin-ups, etc., but it may be harmful.     An aneurysm is a bulge in an artery. It happens when blood pushes up against a weakened or damaged artery wall. A thoracic aortic aneurysm is an aneurysm that occurs in the first part of the aorta, between the heart and the diaphragm. The aorta is the main artery of the body. It supplies blood from the heart to the rest of the body. Some aneurysms may not cause symptoms or problems. However, the major concern with a thoracic aortic aneurysm is that it can enlarge and burst (rupture), or blood can flow between the layers of the wall of the aorta through a tear (aorticdissection). Both of these conditions can cause bleeding inside the body and can be life-threatening if they are not diagnosed and treated right away. What are the causes? The exact cause of this condition is not known. What increases the risk? The following factors may make you more likely to develop this condition:  Being age 65 or older.  Having a hardening of the arteries caused by the buildup of fat and other substances in the lining of a blood vessel (arteriosclerosis).  Having inflammation of the walls of an artery (arteritis).  Having a genetic disease that weakens the body's connective tissue, such as Marfan syndrome.  Having an injury or  trauma to the aorta.  Having an infection that is caused by bacteria, such as syphilis or staphylococcus, in the wall of the aorta (infectious aortitis).  Having high blood pressure (hypertension).  Being male.  Being white (Caucasian).  Having high cholesterol.  Having a family history of aneurysms.  Using tobacco.  Having chronic obstructive pulmonary disease (COPD). What are the signs or symptoms? Symptoms of this condition vary depending on the size and rate of growth of the aneurysm. Most grow slowly and do not cause any symptoms. When symptoms do occur, they may include:  Pain in the chest, back, sides, or abdomen. The pain may vary in intensity. A sudden onset of severe pain may indicate that the aneurysm has ruptured.  Hoarseness.  Cough.  Shortness of breath.  Swallowing problems.  Swelling in the face, arms, or legs.  Fever.  Unexplained weight loss. How is this diagnosed? This condition may be diagnosed with:  An ultrasound.  X-rays.  A CT scan.  An MRI.  Tests to check the arteries for damage or blockages (angiogram). Most unruptured thoracic aortic aneurysms cause no symptoms, so they are often found during exams for other conditions. How is this treated? Treatment for this condition depends on:  The size of the aneurysm.  How fast the aneurysm is growing.  Your age.  Risk factors for rupture. Aneurysms that are smaller than 2.2 inches (5.5 cm) may be managed by using medicines to control blood pressure, manage pain, or fight infection. You may need regular monitoring to see if the aneurysm is getting bigger. Your health care   provider may recommend that you have an ultrasound every year or every 6 months. How often you need to have an ultrasound depends on the size of the aneurysm, how fast it is growing, and whether you have a family history of aneurysms. Surgical repair may be needed if your aneurysm is larger than 2.2 inches or if it is  growing quickly. Follow these instructions at home: Eating and drinking   Eat a healthy diet. Your health care provider may recommend that you:  Lower your salt (sodium) intake. In some people, too much salt can raise blood pressure and increase the risk of thoracic aortic aneurysm.  Avoid foods that are high in saturated fat and cholesterol, such as red meat and dairy.  Eat a diet that is low in sugar.  Increase your fiber intake by including whole grains, vegetables, and fruits in your diet. Eating these foods may help to lower blood pressure.  Limit or avoid alcohol as recommended by your health care provider. Lifestyle   Follow instructions from your health care provider about healthy lifestyle habits. Your health care provider may recommend that you:  Do not use any products that contain nicotine or tobacco, such as cigarettes and e-cigarettes. If you need help quitting, ask your health care provider.  Keep your blood pressure within normal limits. The target limit for most people is below 120/80. Check your blood pressure regularly. If it is high, ask your health care provider about ways that you can control it.  Keep your blood sugar (glucose) level and cholesterol levels within normal limits. Target limits for most people are:  Blood glucose level: Less than 100 mg/dL.  Total cholesterol level: Less than 200 mg/dL.  Maintain a healthy weight. Activity   Stay physically active and exercise regularly. Talk with your health care provider about how often you should exercise and ask which types of exercise are safe for you.  Avoid heavy lifting and activities that take a lot of effort (are strenuous). Ask your health care provider what activities are safe for you. General instructions   Keep all follow-up visits as told by your health care provider. This is important.  Talk with your health care provider about regular screenings to see if the aneurysm is getting bigger.   Take over-the-counter and prescription medicines only as told by your health care provider. Contact a health care provider if:  You have discomfort in your upper back, neck, or abdomen.  You have trouble swallowing.  You have a cough or hoarseness.  You have a family history of aneurysms.  You have unexplained weight loss. Get help right away if:  You have sudden, severe pain in your upper back and abdomen. This pain may move into your chest and arms.  You have shortness of breath.  You have a fever. This information is not intended to replace advice given to you by your health care provider. Make sure you discuss any questions you have with your health care provider. Document Released: 05/30/2005 Document Revised: 03/11/2016 Document Reviewed: 03/11/2016 Elsevier Interactive Patient Education  2017 [employer]   Aortic Dissection An aortic dissection happens when there is a tear in the main blood vessel of the body (aorta). The aorta comes out of the heart, curves around, and then goes down the chest (thoracic aorta) and into the abdomen (abdominal aorta) to supply arteries with blood. The wall of the aorta has inner and outer layers. Aortic dissection occurs most often in the thoracic aorta. As   the tear widens and blood flows through it, the aorta becomes "double-barreled." This means that one part of the aorta continues to carry blood to the body, but blood also flows into the tear, between the layers of the aorta. The torn part of the aorta fills with blood and swells up. This can reduce blood flow through the part of the aorta that is still supplying blood to the body. Aortic dissection is a medical emergency. What are the causes? An aortic dissection is commonly caused by weakening of the artery wall due to high blood pressure. Other causes may include:  An injury, such as from a car crash.  Birth defects that affect the heart (congenital heart defects).  Thickening of the  artery walls. In some cases, the cause is not known. What increases the risk? The following factors may make you more likely to develop this condition:  Having certain medical conditions, such as:  High blood pressure (hypertension).  Hardening and narrowing of the arteries (atherosclerosis).  A genetic disorder that affects the connective tissue, such as Marfan syndrome or Ehlers-Danlos syndrome.  A condition that causes inflammation of blood vessels, such as giant cell arteritis.  Having a chest injury.  Having surgery on the aorta.  Being born with a congenital heart defect.  Being male.  Being older than age 60.  Using cocaine.  Smoking.  Lifting heavy weights or doing other types of high-intensity resistance training. What are the signs or symptoms? Signs and symptoms of aortic dissection start suddenly. The most common symptoms are:  Severe chest pain that may feel like tearing, stabbing, or sharp pain.  Severe pain that spreads (radiates) to the back, neck, jaw, or abdomen. Other symptoms may include:  Trouble breathing.  Dizziness or fainting.  Sudden weakness on one side of the body.  Nausea or vomiting.  Trouble swallowing.  Coughing up blood.  Vomiting blood.  Clammy skin. How is this diagnosed? This condition may be diagnosed based on:  Your symptoms.  A physical exam. This may include:  Listening for abnormal blood flow sounds (murmurs) in your chest or abdomen.  Checking your pulse in your arms and legs.  Checking your blood pressure to see whether it is low or whether there is a difference between the measurements in your right and left arm.  Electrocardiogram (ECG). This test measures the electrical activity in your heart.  Chest X-ray.  CT scan.  MRI.  Aortic angiogram. This test involves injecting dye to make it easier to see your blood vessels clearly.  Echocardiogram to study your heart using sound waves.  Blood tests.  How is this treated? It is important to treat an aortic dissection as quickly as possible. Treatment may start as soon as your health care provider thinks that you have aortic dissection. Treatment depends on the location and severity of your dissection and your overall health. Treatment may include:  Medicines to lower your blood pressure.  Surgery to repair the dissected part of your aorta with artificial material (syntheticgraft).  A medical procedure to insert a stent-graft into the aorta (endovascular procedure). During this procedure, a long, thin tube (stent) is inserted into an artery near the groin (femoral artery) and moved up to the damaged part of the aorta. Then, the stent is opened to help improve blood flow and prevent future dissection. Follow these instructions at home: Activity   Avoid activities that could injure your chest or your abdomen. Ask your health care provider what activities are   safe for you.  After you have recovered, try to stay active. Ask your health care provider what activities are safe for you after recovery.  Do not lift anything that is heavier than 10 lb (4.5 kg) until your health care provider approves.  Do not drive or use heavy machinery while taking prescription pain medicine. Eating and drinking   Eat a heart-healthy diet, which includes lots of fresh fruits and vegetables, low-fat (lean) protein, and whole grains.  Check ingredients and nutrition facts on packaged foods and beverages, and avoid foods with high amounts of:  Salt (sodium).  Saturated fats (like red meat).  Trans fats (like fried food). General instructions   Take over-the-counter and prescription medicines only as told by your health care provider.  Work with your health care provider to manage your blood pressure.  Talk with your health care provider about how to manage stress.  Do not use any products that contain nicotine or tobacco, such as cigarettes and  e-cigarettes. If you need help quitting, ask your health care provider.  Keep all follow-up visits as told by your health care provider. This is important. Get help right away if:  You develop any symptoms of aortic dissection after treatment, including severe pain in your chest, back, or abdomen.  You have a pain in your abdomen.  You have trouble breathing or you develop a cough.  You faint.  You develop a racing heartbeat. These symptoms may represent a serious problem that is an emergency. Do not wait to see if the symptoms will go away. Get medical help right away. Call your local emergency services (911 in the [place] Do not drive yourself to the hospital. Summary  An aortic dissection happens when there is a tear in the main blood vessel of the body (aorta). It is a medical emergency.  The most common symptom is severe pain in the chest that spreads (radiates) to the back, neck, jaw, or abdomen.  It is important to treat an aortic dissection as quickly as possible. Treatment typically includes surgery and medicines. This information is not intended to replace advice given to you by your health care provider. Make sure you discuss any questions you have with your health care provider. Document Released: 09/06/2007 Document Revised: 04/18/2016 Document Reviewed: 04/18/2016 Elsevier Interactive Patient Education  2017 [employer].  Coronary Artery Bypass Grafting  Coronary artery bypass grafting (CABG) is a procedure to bypass or fix arteries of the heart (coronary arteries) that have become narrow or blocked. This narrowing is usually the result of a buildup of fatty deposits (plaques) in the walls of the vessels. The coronary arteries supply the heart with the oxygen and nutrients that it needs to pump blood to your body. In this procedure, a section of blood vessel from another part of the body (usually the chest, arm, or leg) is removed (harvested) and then inserted where it  will allow blood to bypass the damaged part of the coronary artery. The harvested section of blood vessel is called the graft. Tell a health care provider about: Any allergies you have. All medicines you are taking or using, including steroids, blood thinners, vitamins, herbs, eye drops, creams, and over-the-counter medicines. Any problems you or family members have had with anesthetic medicines. Any blood disorders you have. Any surgeries you have had. Any medical conditions you have. Whether you are pregnant or may be pregnant. What are the risks? Generally, this is a safe procedure. However, problems may occur, including: Bleeding,  which may require transfusions. Infection. Short term memory loss, confusion, and personality changes (cognitive dysfunction). Pain at the surgical site. Damage to other structures or organs. Stroke. Allergic reactions to medicines or dyes. Heart attack during or after surgery. Kidney failure. There may be additional risks and complications depending on where the veins are harvested from in your body for the procedure. What happens before the procedure? Staying hydrated Follow instructions from your health care provider about hydration, which may include: Up to 2 hours before the procedure - you may continue to drink clear liquids, such as water, clear fruit juice, black coffee, and plain tea. Eating and drinking restrictions Follow instructions from your health care provider about eating and drinking, which may include: 8 hours before the procedure - stop eating heavy meals or foods such as meat, fried foods, or fatty foods. 6 hours before the procedure - stop eating light meals or foods, such as toast or cereal. 6 hours before the procedure - stop drinking milk or drinks that contain milk. 2 hours before the procedure - stop drinking clear liquids.  Medicine Take over-the-counter and prescription medicines only as told by your health care provider.  Ask your health care provider about: Changing or stopping your regular medicines. This is especially important if you are taking diabetes medicines or blood thinners. You may be asked to start new medicines and stop taking others. Do not stop medicines or adjust dosages on your own. Taking medicines such as aspirin and ibuprofen. These medicines can thin your blood. Do not take these medicines before your procedure if your health care provider instructs you not to. You may be given antibiotic medicine to help prevent infection. General instructions Ask your health care provider how your surgical site will be marked or identified. You may be asked to shower with a germ-killing soap. For 3-6 weeks before the procedure, try not to use any products that contain nicotine or tobacco, such as cigarettes and e-cigarettes. Quitting smoking is one of the best things you can do for your heart health. If you need help quitting, ask your health care provider. Talk with your health care provider about where graft will come from. What happens during the procedure? To reduce your risk of infection: Your health care team will wash or sanitize their hands. Your skin will be washed with soap. Hair may be removed from the surgical area. An IV tube will be inserted into one of your veins. You will be given one or more of the following: A medicine to help you relax (sedative). A medicine to make you fall asleep (general anesthetic). A cut (incision) will be made down the front of the chest through the breastbone (sternum). The sternum will be spread open so your heart can be seen. You may be placed on a heart-lung bypass machine. This machine will provide oxygen to your blood while the heart is undergoing surgery. Your surgeon may be able to do the surgery without the heart-lung bypass machine. That is called beating heart bypass surgery. If a heart-lung bypass machine is needed, your heart will be temporarily  stopped. A section of blood vessel will be harvested from another part of your body (usually the chest, arm, or leg) and used to bypass the blocked arteries of your heart. When the bypass is done, you will be taken off the heart-lung machine if it was used. If your heart was stopped, it will be restarted and will take over again normally. Your chest will be closed.  A bandage (dressing) will be placed over the incisions. Tubes will remain in your chest and will be connected to a suction device to help drain fluid and reinflate the lungs. The procedure may vary among health care providers and hospitals. What happens after the procedure? Your blood pressure, heart rate, breathing rate, and blood oxygen level will be monitored until the medicines you were given have worn off. You may wake up with a tube in your throat to help your breathing. You may be connected to a breathing machine. You will not be able to talk while the tube is in place. The tube will be taken out as soon as it is safe. You will be groggy and may have some pain. You will be given pain medicine to help control the pain. You will be shown how to do deep breathing exercises. Summary In this procedure, a section of blood vessel from another part of the body (usually the chest, arm, or leg) is removed (harvested) and then inserted where it will allow blood to bypass the damaged part of the coronary artery. The harvested section of blood vessel is called the graft. For 3-6 weeks before the procedure, try not to use any products that contain nicotine or tobacco, such as cigarettes and e-cigarettes. Quitting smoking is one of the best things you can do for your heart health. If you need help quitting, ask your health care provider. You may be placed on a heart-lung bypass machine during the surgery. This machine will provide oxygen to your blood while the heart is undergoing surgery. Your surgeon may be able to do the surgery without the  heart-lung bypass machine. That is called beating heart bypass surgery. You may wake up with a tube in your throat to help your breathing. You may be connected to a breathing machine. You will not be able to talk while the tube is in place. The tube will be taken out as soon as it is safe. This information is not intended to replace advice given to you by your health care provider. Make sure you discuss any questions you have with your health care provider. Document Released: 03/09/2005 Document Revised: 09/26/2016 Document Reviewed: 04/18/2016 Elsevier Interactive Patient Education  2019 [employer]  Coronary Artery Bypass Grafting, Care After This sheet gives you information about how to care for yourself after your procedure. Your health care provider may also give you more specific instructions. If you have problems or questions, contact your health care provider. What can I expect after the procedure? After the procedure, it is common to have:  Nausea and a lack of appetite.  Constipation.  Weakness and fatigue.  Depression or irritability.  Pain or discomfort in your incision areas. Follow these instructions at home: Medicines  Take over-the-counter and prescription medicines only as told by your health care provider. Do not stop taking medicines or start any new medicines without approval from your health care provider.  If you were prescribed an antibiotic medicine, take it as told by your health care provider. Do not stop taking the antibiotic even if you start to feel better.  Do not drive or use heavy machinery while taking prescription pain medicine. Incision care   Follow instructions from your health care provider about how to take care of your incisions. Make sure you: ? Wash your hands with soap and water before you change your bandage (dressing). If soap and water are not available, use hand sanitizer. ? Change your dressing as told  by your health care provider.  ? Leave stitches (sutures), skin glue, or adhesive strips in place. These skin closures may need to stay in place for 2 weeks or longer. If adhesive strip edges start to loosen and curl up, you may trim the loose edges. Do not remove adhesive strips completely unless your health care provider tells you to do that.  Keep incision areas clean, dry, and protected.  Check your incision areas every day for signs of infection. Check for: ? More redness, swelling, or pain. ? More fluid or blood. ? Warmth. ? Pus or a bad smell.  If incisions were made in your legs: ? Avoid crossing your legs. ? Avoid sitting for long periods of time. Change positions every 30 minutes. ? Raise (elevate) your legs when you are sitting. Bathing  Do not take baths, swim, or use a hot tub until your health care provider approves.  Only take sponge baths. Pat the incisions dry. Do not rub incisions with a washcloth or towel.  Ask your health care provider when you can shower. Eating and drinking  Eat foods that are high in fiber, such as raw fruits and vegetables, whole grains, beans, and nuts. Meats should be lean cut. Avoid canned, processed, and fried foods. This can help prevent constipation and is a recommended part of a heart-healthy diet.  Drink enough fluid to keep your urine clear or pale yellow.  Limit alcohol intake to no more than 1 drink a day for nonpregnant women and 2 drinks a day for men. One drink equals 12 oz of beer, 5 oz of wine, or 1 oz of hard liquor. Activity  Rest and limit your activity as told by your health care provider. You may be instructed to: ? Stop any activity right away if you have chest pain, shortness of breath, irregular heartbeats, or dizziness. Get help right away if you have any of these symptoms. ? Move around frequently for short periods or take short walks as directed by your health care provider. Gradually increase your activities. You may need physical therapy or  cardiac rehabilitation to help strengthen your muscles and build your endurance. ? Avoid lifting, pushing, or pulling anything that is heavier than 10 lb (4.5 kg) for at least 6 weeks or as told by your health care provider.  Do not drive until your health care provider approves.  Ask your health care provider when you may return to work.  Ask your health care provider when you may resume sexual activity. General instructions  Do not use any products that contain nicotine or tobacco, such as cigarettes and e-cigarettes. If you need help quitting, ask your health care provider.  Take 2-3 deep breaths every few hours during the day, while you recover. This helps expand your lungs and prevent complications like pneumonia after surgery.  If you were given a device called an incentive spirometer, use it several times a day to practice deep breathing. Support your chest with a pillow or your arms when you take deep breaths or cough.  Wear compression stockings as told by your health care provider. These stockings help to prevent blood clots and reduce swelling in your legs.  Weigh yourself every day. This helps identify if your body is holding (retaining) fluid that may make your heart and lungs work harder.  Keep all follow-up visits as told by your health care provider. This is important. Contact a health care provider if:  You have more redness, swelling, or pain  around any incision.  You have more fluid or blood coming from any incision.  Any incision feels warm to the touch.  You have pus or a bad smell coming from any incision  You have a fever.  You have swelling in your ankles or legs.  You have pain in your legs.  You gain 2 lb (0.9 kg) or more a day.  You are nauseous or you vomit.  You have diarrhea. Get help right away if:  You have chest pain that spreads to your jaw or arms.  You are short of breath.  You have a fast or irregular heartbeat.  You notice a  "clicking" in your breastbone (sternum) when you move.  You have numbness or weakness in your arms or legs.  You feel dizzy or light-headed. Summary  After the procedure, it is common to have pain or discomfort in the incision areas.  Do not take baths, swim, or use a hot tub until your health care provider approves.  Gradually increase your activities. You may need physical therapy or cardiac rehabilitation to help strengthen your muscles and build your endurance.  Weigh yourself every day. This helps identify if your body is holding (retaining) fluid that may make your heart and lungs work harder. This information is not intended to replace advice given to you by your health care provider. Make sure you discuss any questions you have with your health care provider. Document Released: 12/17/2004 Document Revised: 04/18/2016 Document Reviewed: 04/18/2016 Elsevier Interactive Patient Education  2019 [employer].

## 2018-11-16 NOTE — Progress Notes (Signed)
  Echocardiogram 2D Echocardiogram has been performed.  Thomas Washington 11/16/2018, 11:36 AM

## 2018-11-19 ENCOUNTER — Other Ambulatory Visit (HOSPITAL_COMMUNITY)
Admission: RE | Admit: 2018-11-19 | Discharge: 2018-11-19 | Disposition: A | Discharge status: Home / Self Care | Payer: Medicare HMO | Source: Ambulatory Visit | Attending: Cardiothoracic Surgery | Admitting: Cardiothoracic Surgery

## 2018-11-19 ENCOUNTER — Ambulatory Visit (HOSPITAL_COMMUNITY): Payer: Medicare HMO

## 2018-11-19 ENCOUNTER — Other Ambulatory Visit: Payer: Self-pay

## 2018-11-19 ENCOUNTER — Ambulatory Visit (HOSPITAL_COMMUNITY)
Admission: RE | Admit: 2018-11-19 | Discharge: 2018-11-19 | Disposition: A | Discharge status: Home / Self Care | Payer: Medicare HMO | Source: Ambulatory Visit | Attending: Cardiothoracic Surgery | Admitting: Cardiothoracic Surgery

## 2018-11-19 DIAGNOSIS — I2510 Atherosclerotic heart disease of native coronary artery without angina pectoris: Secondary | ICD-10-CM

## 2018-11-19 DIAGNOSIS — Z01818 Encounter for other preprocedural examination: Secondary | ICD-10-CM | POA: Diagnosis not present

## 2018-11-19 DIAGNOSIS — Z1159 Encounter for screening for other viral diseases: Secondary | ICD-10-CM | POA: Insufficient documentation

## 2018-11-19 DIAGNOSIS — I712 Thoracic aortic aneurysm, without rupture: Secondary | ICD-10-CM | POA: Diagnosis not present

## 2018-11-19 LAB — SARS CORONAVIRUS 2 BY RT PCR (HOSPITAL ORDER, PERFORMED IN ~~LOC~~ HOSPITAL LAB): SARS Coronavirus 2: NEGATIVE

## 2018-11-19 MED ORDER — IOHEXOL 350 MG/ML SOLN
75.0000 mL | Freq: Once | INTRAVENOUS | Status: AC | PRN
  Administered 2018-11-19: 75 mL via INTRAVENOUS

## 2018-11-19 MED ORDER — PHENYLEPHRINE HCL-NACL 10-0.9 MG/250ML-% IV SOLN
INTRAVENOUS | Status: AC
  Filled 2018-11-19: qty 250

## 2018-11-19 NOTE — Pre-Procedure Instructions (Signed)
Thomas Washington  11/19/2018     [facility] [place], [place] [address] [place] [address] Phone: [phone] Fax: [phone]  [facility] - [place], [place] - [address] [place] [address] Phone: [phone] Fax: [phone]   Your procedure is scheduled on Wednesday, June 10th  Report to [facility] Entrance A at 6:30 A.M.  Call this number if you have problems the morning of surgery:  [phone]   Remember:  Do not eat or drink after midnight.     Take these medicines the morning of surgery with A SIP OF WATER  NONE  If needed - acetaminophen (TYLENOL) , nitroGLYCERIN (NITROSTAT), tamsulosin (FLOMAX), triamcinolone (NASACORT ALLERGY 24HR)   Follow your surgeon's instructions on when to stop Aspirin.  If no instructions were given by your surgeon then you will need to call the office to get those instructions.    As of today, STOP taking any Aspirin (unless otherwise instructed by your surgeon), Aleve, Naproxen, Ibuprofen, Motrin, Advil, Goody's, BC's, all herbal medications, fish oil, and all vitamins.  How to Manage Your Diabetes Before and After Surgery  Why is it important to control my blood sugar before and after surgery? . Improving blood sugar levels before and after surgery helps healing and can limit problems. . A way of improving blood sugar control is eating a healthy diet by: o  Eating less sugar and carbohydrates o  Increasing activity/exercise o  Talking with your doctor about reaching your blood sugar goals . High blood sugars (greater than 180 mg/dL) can raise your risk of infections and slow your recovery, so you will need to focus on controlling your diabetes during the weeks before surgery. . Make sure that the doctor who takes care of your diabetes knows about your planned surgery including the date and location.  How do I manage my blood sugar before  surgery? . Check your blood sugar at least 4 times a day, starting 2 days before surgery, to make sure that the level is not too high or low. o Check your blood sugar the morning of your surgery when you wake up and every 2 hours until you get to the Short Stay unit. . If your blood sugar is less than 70 mg/dL, you will need to treat for low blood sugar: o Do not take insulin. o Treat a low blood sugar (less than 70 mg/dL) with  cup of clear juice (cranberry or apple), 4 glucose tablets, OR glucose gel. Recheck blood sugar in 15 minutes after treatment (to make sure it is greater than 70 mg/dL). If your blood sugar is not greater than 70 mg/dL on recheck, call [phone] o  for further instructions. . Report your blood sugar to the short stay nurse when you get to Short Stay.  . If you are admitted to the hospital after surgery: o Your blood sugar will be checked by the staff and you will probably be given insulin after surgery (instead of oral diabetes medicines) to make sure you have good blood sugar levels. o The goal for blood sugar control after surgery is 80-180 mg/dL.  WHAT DO I DO ABOUT MY DIABETES MEDICATION?  Marland Kitchen Do not take JANUVIA/oral diabetes medicines (pills) the morning of surgery. . Do not take metFORMIN (GLUCOPHAGE)/oral diabetes medicines (pills) the morning of surgery.  . The day of surgery, do not take other diabetes injectables, including Byetta (exenatide), Bydureon (exenatide ER),  Victoza (liraglutide), or Trulicity (dulaglutide).  . If your CBG is greater than 220 mg/dL, you may take  of your sliding scale (correction) dose of insulin. O Reviewed and Endorsed by [facility] Patient Education Committee, August 2015  Do not wear jewelry, make-up or nail polish.  Do not wear lotions, powders, or perfumes, or deodorant.  Do not shave 48 hours prior to surgery.  Men may shave face and neck.  Do not bring valuables to the hospital.  [facility] is not responsible for  any belongings or valuables.  Contacts, dentures or bridgework may not be worn into surgery.  Leave your suitcase in the car.  After surgery it may be brought to your room.  For patients admitted to the hospital, discharge time will be determined by your treatment team.  Patients discharged the day of surgery will not be allowed to drive home.   Special instructions:  [facility]- Preparing For Surgery  Before surgery, you can play an important role. Because skin is not sterile, your skin needs to be as free of germs as possible. You can reduce the number of germs on your skin by washing with CHG (chlorahexidine gluconate) Soap before surgery.  CHG is an antiseptic cleaner which kills germs and bonds with the skin to continue killing germs even after washing.    Oral Hygiene is also important to reduce your risk of infection.  Remember - BRUSH YOUR TEETH THE MORNING OF SURGERY WITH YOUR REGULAR TOOTHPASTE  Please do not use if you have an allergy to CHG or antibacterial soaps. If your skin becomes reddened/irritated stop using the CHG.  Do not shave (including legs and underarms) for at least 48 hours prior to first CHG shower. It is OK to shave your face.  Please follow these instructions carefully.   1. Shower the NIGHT BEFORE SURGERY and the MORNING OF SURGERY with CHG.   2. If you chose to wash your hair, wash your hair first as usual with your normal shampoo.  3. After you shampoo, rinse your hair and body thoroughly to remove the shampoo.  4. Use CHG as you would any other liquid soap. You can apply CHG directly to the skin and wash gently with a scrungie or a clean washcloth.   5. Apply the CHG Soap to your body ONLY FROM THE NECK DOWN.  Do not use on open wounds or open sores. Avoid contact with your eyes, ears, mouth and genitals (private parts). Wash Face and genitals (private parts)  with your normal soap.  6. Wash thoroughly, paying special attention to the area where your  surgery will be performed.  7. Thoroughly rinse your body with warm water from the neck down.  8. DO NOT shower/wash with your normal soap after using and rinsing off the CHG Soap.  9. Pat yourself dry with a CLEAN TOWEL.  10. Wear CLEAN PAJAMAS to bed the night before surgery, wear comfortable clothes the morning of surgery  11. Place CLEAN SHEETS on your bed the night of your first shower and DO NOT SLEEP WITH PETS.  Day of Surgery:  Do not apply any deodorants/lotions.  Please wear clean clothes to the hospital/surgery center.   Remember to brush your teeth WITH YOUR REGULAR TOOTHPASTE.  Please read over the following fact sheets that you were given. Pain Booklet, Coughing and Deep Breathing, MRSA Information and Surgical Site Infection Prevention

## 2018-11-20 ENCOUNTER — Ambulatory Visit (HOSPITAL_BASED_OUTPATIENT_CLINIC_OR_DEPARTMENT_OTHER)
Admission: RE | Admit: 2018-11-20 | Discharge: 2018-11-20 | Disposition: A | Discharge status: Home / Self Care | Payer: Medicare HMO | Source: Ambulatory Visit | Attending: Cardiothoracic Surgery | Admitting: Cardiothoracic Surgery

## 2018-11-20 ENCOUNTER — Encounter (HOSPITAL_COMMUNITY): Payer: Self-pay

## 2018-11-20 ENCOUNTER — Other Ambulatory Visit: Payer: Self-pay

## 2018-11-20 ENCOUNTER — Encounter (HOSPITAL_COMMUNITY)
Admission: RE | Admit: 2018-11-20 | Discharge: 2018-11-20 | Disposition: A | Discharge status: Home / Self Care | Payer: Medicare HMO | Source: Ambulatory Visit | Attending: Cardiothoracic Surgery | Admitting: Cardiothoracic Surgery

## 2018-11-20 ENCOUNTER — Ambulatory Visit (HOSPITAL_COMMUNITY)
Admission: RE | Admit: 2018-11-20 | Discharge: 2018-11-20 | Disposition: A | Discharge status: Home / Self Care | Payer: Medicare HMO | Source: Ambulatory Visit | Attending: Cardiothoracic Surgery | Admitting: Cardiothoracic Surgery

## 2018-11-20 ENCOUNTER — Telehealth: Payer: Self-pay | Admitting: Cardiothoracic Surgery

## 2018-11-20 DIAGNOSIS — J984 Other disorders of lung: Secondary | ICD-10-CM | POA: Diagnosis not present

## 2018-11-20 DIAGNOSIS — Z01810 Encounter for preprocedural cardiovascular examination: Secondary | ICD-10-CM | POA: Diagnosis not present

## 2018-11-20 DIAGNOSIS — I2510 Atherosclerotic heart disease of native coronary artery without angina pectoris: Secondary | ICD-10-CM

## 2018-11-20 HISTORY — DX: Atherosclerotic heart disease of native coronary artery without angina pectoris: I25.10

## 2018-11-20 HISTORY — DX: Angina pectoris, unspecified: I20.9

## 2018-11-20 HISTORY — DX: Personal history of urinary calculi: Z87.442

## 2018-11-20 LAB — COMPREHENSIVE METABOLIC PANEL
ALT: 26 U/L (ref 0–44)
AST: 22 U/L (ref 15–41)
Albumin: 4.1 g/dL (ref 3.5–5.0)
Alkaline Phosphatase: 53 U/L (ref 38–126)
Anion gap: 11 (ref 5–15)
BUN: 12 mg/dL (ref 8–23)
CO2: 18 mmol/L — ABNORMAL LOW (ref 22–32)
Calcium: 9.2 mg/dL (ref 8.9–10.3)
Chloride: 108 mmol/L (ref 98–111)
Creatinine, Ser: 1.01 mg/dL (ref 0.61–1.24)
GFR calc Af Amer: >60 mL/min (ref >60)
GFR calc non Af Amer: >60 mL/min (ref >60)
Glucose, Bld: 183 mg/dL — ABNORMAL HIGH (ref 70–99)
Potassium: 3.7 mmol/L (ref 3.5–5.1)
Sodium: 137 mmol/L (ref 135–145)
Total Bilirubin: 0.7 mg/dL (ref 0.3–1.2)
Total Protein: 7.1 g/dL (ref 6.5–8.1)

## 2018-11-20 LAB — CBC
HCT: 46 % (ref 39.0–52.0)
Hemoglobin: 15.4 g/dL (ref 13.0–17.0)
MCH: 29.6 pg (ref 26.0–34.0)
MCHC: 33.5 g/dL (ref 30.0–36.0)
MCV: 88.5 fL (ref 80.0–100.0)
Platelets: 199 10*3/uL (ref 150–400)
RBC: 5.2 MIL/uL (ref 4.22–5.81)
RDW: 13.3 % (ref 11.5–15.5)
WBC: 8.6 10*3/uL (ref 4.0–10.5)
nRBC: 0 % (ref 0.0–0.2)

## 2018-11-20 LAB — URINALYSIS, ROUTINE W REFLEX MICROSCOPIC
Bilirubin Urine: NEGATIVE
Glucose, UA: NEGATIVE mg/dL
Hgb urine dipstick: NEGATIVE
Ketones, ur: NEGATIVE mg/dL
Leukocytes,Ua: NEGATIVE
Nitrite: NEGATIVE
Protein, ur: NEGATIVE mg/dL
Specific Gravity, Urine: 1.002 — ABNORMAL LOW (ref 1.005–1.030)
pH: 6 (ref 5.0–8.0)

## 2018-11-20 LAB — PROTIME-INR
INR: 1.1 (ref 0.8–1.2)
Prothrombin Time: 13.8 seconds (ref 11.4–15.2)

## 2018-11-20 LAB — BLOOD GAS, ARTERIAL
Acid-base deficit: 0.1 mmol/L (ref 0.0–2.0)
Bicarbonate: 23.7 mmol/L (ref 20.0–28.0)
Drawn by: 470991
FIO2: 21
O2 Saturation: 98.1 %
Patient temperature: 98.6
pCO2 arterial: 36.6 mmHg (ref 32.0–48.0)
pH, Arterial: 7.426 (ref 7.350–7.450)
pO2, Arterial: 114 mmHg — ABNORMAL HIGH (ref 83.0–108.0)

## 2018-11-20 LAB — ABO/RH: ABO/RH(D): O POS

## 2018-11-20 LAB — TYPE AND SCREEN
ABO/RH(D): O POS
Antibody Screen: NEGATIVE

## 2018-11-20 LAB — APTT: aPTT: 27 seconds (ref 24–36)

## 2018-11-20 LAB — HEMOGLOBIN A1C
Hgb A1c MFr Bld: 6.5 % — ABNORMAL HIGH (ref 4.8–5.6)
Mean Plasma Glucose: 139.85 mg/dL

## 2018-11-20 LAB — SURGICAL PCR SCREEN
MRSA, PCR: NEGATIVE
Staphylococcus aureus: POSITIVE — AB

## 2018-11-20 MED ORDER — DEXMEDETOMIDINE HCL IN NACL 400 MCG/100ML IV SOLN
0.1000 ug/kg/h | INTRAVENOUS | Status: AC
  Administered 2018-11-21: .3 ug/kg/h via INTRAVENOUS
  Filled 2018-11-20: qty 100

## 2018-11-20 MED ORDER — TRANEXAMIC ACID 1000 MG/10ML IV SOLN
1.5000 mg/kg/h | INTRAVENOUS | Status: DC
  Administered 2018-11-21: 1.5 mg/kg/h via INTRAVENOUS
  Filled 2018-11-20: qty 25

## 2018-11-20 MED ORDER — NOREPINEPHRINE 4 MG/250ML-% IV SOLN
0.0000 ug/min | INTRAVENOUS | Status: DC
  Filled 2018-11-20: qty 250

## 2018-11-20 MED ORDER — MILRINONE LACTATE IN DEXTROSE 20-5 MG/100ML-% IV SOLN
0.3000 ug/kg/min | INTRAVENOUS | Status: DC
  Filled 2018-11-20: qty 100

## 2018-11-20 MED ORDER — PHENYLEPHRINE HCL-NACL 20-0.9 MG/250ML-% IV SOLN
30.0000 ug/min | INTRAVENOUS | Status: AC
  Administered 2018-11-21: 20 ug/min via INTRAVENOUS
  Filled 2018-11-20: qty 250

## 2018-11-20 MED ORDER — EPINEPHRINE PF 1 MG/ML IJ SOLN
0.0000 ug/min | INTRAVENOUS | Status: DC
  Filled 2018-11-20: qty 4

## 2018-11-20 MED ORDER — MAGNESIUM SULFATE 50 % IJ SOLN
40.0000 meq | INTRAMUSCULAR | Status: DC
  Filled 2018-11-20: qty 9.85

## 2018-11-20 MED ORDER — INSULIN REGULAR(HUMAN) IN NACL 100-0.9 UT/100ML-% IV SOLN
INTRAVENOUS | Status: AC
  Administered 2018-11-21: 1 [IU]/h via INTRAVENOUS
  Filled 2018-11-20: qty 100

## 2018-11-20 MED ORDER — NITROGLYCERIN IN D5W 200-5 MCG/ML-% IV SOLN
2.0000 ug/min | INTRAVENOUS | Status: DC
  Filled 2018-11-20: qty 250

## 2018-11-20 MED ORDER — TRANEXAMIC ACID (OHS) BOLUS VIA INFUSION
15.0000 mg/kg | INTRAVENOUS | Status: AC
  Administered 2018-11-21: 1312.5 mg via INTRAVENOUS
  Filled 2018-11-20: qty 1313

## 2018-11-20 MED ORDER — VANCOMYCIN HCL 10 G IV SOLR
1500.0000 mg | INTRAVENOUS | Status: AC
  Administered 2018-11-21: 1500 mg via INTRAVENOUS
  Filled 2018-11-20: qty 1500

## 2018-11-20 MED ORDER — SODIUM CHLORIDE 0.9 % IV SOLN
1.5000 g | INTRAVENOUS | Status: AC
  Administered 2018-11-21: 1.5 g via INTRAVENOUS
  Filled 2018-11-20: qty 1.5

## 2018-11-20 MED ORDER — SODIUM CHLORIDE 0.9 % IV SOLN
INTRAVENOUS | Status: DC
  Filled 2018-11-20: qty 30

## 2018-11-20 MED ORDER — POTASSIUM CHLORIDE 2 MEQ/ML IV SOLN
80.0000 meq | INTRAVENOUS | Status: DC
  Filled 2018-11-20: qty 40

## 2018-11-20 MED ORDER — SODIUM CHLORIDE 0.9 % IV SOLN
750.0000 mg | INTRAVENOUS | Status: DC
  Filled 2018-11-20: qty 750

## 2018-11-20 MED ORDER — TRANEXAMIC ACID (OHS) PUMP PRIME SOLUTION
2.0000 mg/kg | INTRAVENOUS | Status: DC
  Filled 2018-11-20: qty 1.75

## 2018-11-20 MED ORDER — PLASMA-LYTE 148 IV SOLN
INTRAVENOUS | Status: AC
  Administered 2018-11-21: 10:00:00
  Filled 2018-11-20: qty 2.5

## 2018-11-20 MED ORDER — DOPAMINE-DEXTROSE 3.2-5 MG/ML-% IV SOLN
0.0000 ug/kg/min | INTRAVENOUS | Status: DC
  Filled 2018-11-20: qty 250

## 2018-11-20 NOTE — Progress Notes (Signed)
Pre-CABG testing has been completed. Preliminary results can be found in CV Proc through chart review.   11/20/18 11:40 AM Carlos Levering RVT

## 2018-11-20 NOTE — Progress Notes (Signed)
Anesthesia Chart Review:  Case:  [id number] Date/Time:  11/21/18 0815   Procedures:      CORONARY ARTERY BYPASS GRAFTING (CABG) (N/A Chest)     POSSIBLE REPLACEMENT ASCENDING AORTA (N/A Chest)     TRANSESOPHAGEAL ECHOCARDIOGRAM (TEE) (N/A )   Anesthesia type:  General   Pre-op diagnosis:  CAD   Location:  [facility] OR ROOM 14 / [facility]   Surgeon:  Grace Isaac, MD      DISCUSSION: Patient is a 68 year old male scheduled for the above procedure.  History includes CAD, ascending TAA (4.5 cm 11/2018), DM2, hypercholesterolemia, never smoker.  If no acute changes in anticipate that he can proceed as planned.   VS: BP (!) 150/86   Pulse 64   Temp (!) 36.2 C   Resp 20   Ht '5\' 9"'  (1.753 m)   Wt 87.5 kg   SpO2 97%   BMI 28.47 kg/m     PROVIDERS: Elby Showers, MD is PCP Mertie Moores, MD is cardiologist. Seen on 11/09/18 for coronary calcium score of 2219 and dilated TAA. Cardiac cath recommended, leading to above surgery plans.   LABS: Labs reviewed: Acceptable for surgery. (all labs ordered are listed, but only abnormal results are displayed)  Labs Reviewed  SURGICAL PCR SCREEN - Abnormal; Notable for the following components:      Result Value   Staphylococcus aureus POSITIVE (*)    All other components within normal limits  BLOOD GAS, ARTERIAL - Abnormal; Notable for the following components:   pO2, Arterial 114 (*)    All other components within normal limits  COMPREHENSIVE METABOLIC PANEL - Abnormal; Notable for the following components:   CO2 18 (*)    Glucose, Bld 183 (*)    All other components within normal limits  HEMOGLOBIN A1C - Abnormal; Notable for the following components:   Hgb A1c MFr Bld 6.5 (*)    All other components within normal limits  URINALYSIS, ROUTINE W REFLEX MICROSCOPIC - Abnormal; Notable for the following components:   Specific Gravity, Urine 1.002 (*)    All other components within normal limits  APTT  CBC  PROTIME-INR  TYPE AND SCREEN   ABO/RH    IMAGES: CXR 11/20/18: IMPRESSION: Right basilar scarring, stable.  No active disease.  CTA chest 11/19/18: IMPRESSION: 1. Unchanged mild aneurysmal dilatation of the ascending thoracic aorta (4.5 cm in diameter). Ascending thoracic aortic aneurysm. Recommend semi-annual imaging followup by CTA or MRA and referral to cardiothoracic surgery if not already obtained. This recommendation follows 2010 ACCF/AHA/AATS/ACR/ASA/SCA/SCAI/SIR/STS/SVM Guidelines for the Diagnosis and Management of Patients With Thoracic Aortic Disease. Circulation. 2010; 121: E268-T419. Aortic aneurysm NOS (ICD10-I71.9). 2. Aortic atherosclerosis, in addition to left main and 3 vessel coronary artery disease. Please note that although the presence of coronary artery calcium documents the presence of coronary artery disease, the severity of this disease and any potential stenosis cannot be assessed on this non-gated CT examination. Assessment for potential risk factor modification, dietary therapy or pharmacologic therapy may be warranted, if clinically indicated. 3. Nodular contour of the liver which may suggest cirrhosis. There are 2 hypervascular lesions in the liver which are incompletely characterized on today's examination, but warrant further evaluation with follow-up nonemergent MRI of the abdomen with and without IV gadolinium in the near future to exclude the possibility of small hepatocellular carcinoma. 4. Additional incidental findings, as above.   EKG: 11/14/18: NSR   CV: Carotid US 11/20/18: Summary: Right Carotid: Velocities in the right ICA  are consistent with a 1-39% stenosis. Left Carotid: Velocities in the left ICA are consistent with a 1-39% stenosis. Vertebrals: Bilateral vertebral arteries demonstrate antegrade flow.  Echo 11/16/18: IMPRESSIONS  1. The left ventricle has normal systolic function with an ejection fraction of 60-65%. The cavity size was normal. Left ventricular  diastolic Doppler parameters are consistent with impaired relaxation.  2. The right ventricle has normal systolic function. The cavity was normal. There is no increase in right ventricular wall thickness.  3. There is dilatation of the aortic root measuring 40 mm.  4. There is no evidence of pericardial effusion.  Cardiac cath 11/14/18:  Prox RCA to Mid RCA lesion is 80% stenosed.  LPDA lesion is 75% stenosed.  Ost 2nd Mrg to 2nd Mrg lesion is 100% stenosed.  Mid LAD lesion is 100% stenosed.  Prox LAD to Mid LAD lesion is 75% stenosed.  Prox LAD lesion is 50% stenosed.  Dist LM to Ost LAD lesion is 50% stenosed.  The left ventricular systolic function is normal.  LV end diastolic pressure is normal.  The left ventricular ejection fraction is 55-65% by visual estimate.  There is no aortic valve stenosis. Multivessel CAD with CTO of LAD and CTO of OM.   Plan for surgical consult.  Continue aggressive medical therapy.    Past Medical History:  Diagnosis Date  . Anginal pain ([place])   . Coronary artery disease   . Diabetes mellitus without complication ([place])   . History of kidney stones   . Hypercholesteremia   . MRSA (methicillin resistant staph aureus) culture positive     Past Surgical History:  Procedure Laterality Date  . LEFT HEART CATH AND CORONARY ANGIOGRAPHY N/A 11/14/2018   Procedure: LEFT HEART CATH AND CORONARY ANGIOGRAPHY;  Surgeon: Jettie Booze, MD;  Location: [facility];  Service: Cardiovascular;  Laterality: N/A;  . VASECTOMY    . WISDOM TOOTH EXTRACTION      MEDICATIONS: . ACCU-CHEK FASTCLIX LANCETS MISC  . acetaminophen (TYLENOL) 500 MG tablet  . aspirin EC 81 MG tablet  . aspirin-acetaminophen-caffeine (EXCEDRIN MIGRAINE) 250-250-65 MG tablet  . B Complex Vitamins (B COMPLEX PO)  . Blood Glucose Monitoring Suppl (ACCU-CHEK AVIVA PLUS) w/Device KIT  . calcium carbonate (TUMS - DOSED IN MG ELEMENTAL CALCIUM) 500 MG chewable tablet  .  Cholecalciferol (VITAMIN D3) 250 MCG (10000 UT) capsule  . Coenzyme Q10 (CO Q 10) 100 MG CAPS  . Cyanocobalamin (B-12) 5000 MCG CAPS  . folic acid (FOLVITE) 144 MCG tablet  . glucose blood (ACCU-CHEK ACTIVE STRIPS) test strip  . ibuprofen (ADVIL) 200 MG tablet  . JANUVIA 100 MG tablet  . metFORMIN (GLUCOPHAGE) 1000 MG tablet  . nitroGLYCERIN (NITROSTAT) 0.4 MG SL tablet  . rosuvastatin (CRESTOR) 40 MG tablet  . tamsulosin (FLOMAX) 0.4 MG CAPS capsule  . triamcinolone (NASACORT ALLERGY 24HR) 55 MCG/ACT AERO nasal inhaler   No current facility-administered medications for this encounter.    Derrill Memo ON 11/21/2018] cefUROXime (ZINACEF) 1.5 g in sodium chloride 0.9 % 100 mL IVPB  . [START ON 11/21/2018] cefUROXime (ZINACEF) 750 mg in sodium chloride 0.9 % 100 mL IVPB  . [START ON 11/21/2018] dexmedetomidine (PRECEDEX) 400 MCG/100ML (4 mcg/mL) infusion  . [START ON 11/21/2018] DOPamine (INTROPIN) 800 mg in dextrose 5 % 250 mL (3.2 mg/mL) infusion  . [START ON 11/21/2018] EPINEPHrine (ADRENALIN) 4 mg in dextrose 5 % 250 mL (0.016 mg/mL) infusion  . [START ON 11/21/2018] heparin 2,500 Units, papaverine 30 mg  in electrolyte-148 (PLASMALYTE-148) 500 mL irrigation  . [START ON 11/21/2018] heparin 30,000 units/NS 1000 mL solution for CELLSAVER  . [START ON 11/21/2018] insulin regular, human (MYXREDLIN) 100 units/ 100 mL infusion  . [START ON 11/21/2018] magnesium sulfate (IV Push/IM) injection 40 mEq  . [START ON 11/21/2018] milrinone (PRIMACOR) 20 MG/100 ML (0.2 mg/mL) infusion  . [START ON 11/21/2018] nitroGLYCERIN 50 mg in dextrose 5 % 250 mL (0.2 mg/mL) infusion  . [START ON 11/21/2018] norepinephrine (LEVOPHED) 30m in 2575mpremix infusion  . [START ON 11/21/2018] phenylephrine (NEOSYNEPHRINE) 20-0.9 MG/250ML-% infusion  . [START ON 11/21/2018] potassium chloride injection 80 mEq  . [START ON 11/21/2018] tranexamic acid (CYKLOKAPRON) 2,500 mg in sodium chloride 0.9 % 250 mL (10 mg/mL) infusion  . [START ON  11/21/2018] tranexamic acid (CYKLOKAPRON) bolus via infusion - over 30 minutes 1,312.5 mg  . [START ON 11/21/2018] tranexamic acid (CYKLOKAPRON) pump prime solution 175 mg  . [START ON 11/21/2018] vancomycin (VANCOCIN) 1,500 mg in sodium chloride 0.9 % 250 mL IVPB    AlMyra GianottiPA-C Surgical Short Stay/Anesthesiology MCBrown Medicine Endoscopy Centerhone ([phone] Valley Hospitalhone ([phone]/02/2019 2:46 PM

## 2018-11-20 NOTE — Anesthesia Preprocedure Evaluation (Addendum)
Anesthesia Evaluation  Patient identified by MRN, date of birth, ID band Patient awake    Reviewed: Allergy & Precautions, NPO status , Patient's Chart, lab work & pertinent test results  Airway Mallampati: II  TM Distance: >3 FB Neck ROM: Full    Dental  (+) Teeth Intact, Dental Advisory Given   Pulmonary neg pulmonary ROS,    Pulmonary exam normal breath sounds clear to auscultation       Cardiovascular hypertension, + angina + CAD  Normal cardiovascular exam Rhythm:Regular Rate:Normal  Echo 11/16/2018: 1. The left ventricle has normal systolic function with an ejection fraction of 60-65%. The cavity size was normal. Left ventricular diastolic Doppler parameters are consistent with impaired relaxation.  2. The right ventricle has normal systolic function. The cavity was normal. There is no increase in right ventricular wall thickness.  3. There is dilatation of the aortic root measuring 40 mm.  4. There is no evidence of pericardial effusion   Neuro/Psych  Neuromuscular disease negative psych ROS   GI/Hepatic negative GI ROS, Neg liver ROS,   Endo/Other  diabetes, Type 2, Oral Hypoglycemic Agents  Renal/GU negative Renal ROS     Musculoskeletal negative musculoskeletal ROS (+)   Abdominal   Peds  Hematology negative hematology ROS (+)   Anesthesia Other Findings Day of surgery medications reviewed with the patient.  Reproductive/Obstetrics                            Anesthesia Physical Anesthesia Plan  ASA: IV  Anesthesia Plan: General   Post-op Pain Management:    Induction: Intravenous  PONV Risk Score and Plan: 2 and Midazolam and Treatment may vary due to age or medical condition  Airway Management Planned: Oral ETT  Additional Equipment: Arterial line, CVP, PA Cath, TEE and Ultrasound Guidance Line Placement  Intra-op Plan:   Post-operative Plan: Post-operative  intubation/ventilation  Informed Consent: I have reviewed the patients History and Physical, chart, labs and discussed the procedure including the risks, benefits and alternatives for the proposed anesthesia with the patient or authorized representative who has indicated his/her understanding and acceptance.     Dental advisory given  Plan Discussed with: CRNA  Anesthesia Plan Comments: (PAT note written 11/20/2018 by Myra Gianotti, PA-C. )       Anesthesia Quick Evaluation

## 2018-11-20 NOTE — Progress Notes (Signed)
PCP - Dr. Tedra Senegal Cardiologist - denies  Chest x-ray - 11/20/2018 EKG - 11/14/2018 Stress Test - 09/20/12 ECHO - 11/16/2018 Cardiac Cath - 11/14/2018  Sleep Study - denies CPAP - denies  Fasting Blood Sugar - 120-135 Checks Blood Sugar 1-2 times a day  Blood Thinner Instructions: N/A Aspirin Instructions: LD 11/20/2018  Anesthesia review: YES, cardiac hx  Coronavirus Screening  Have you experienced the following symptoms:  Cough yes/no: No Fever (>100.61F)  yes/no: No Runny nose yes/no: No Sore throat yes/no: No Difficulty breathing/shortness of breath  yes/no: No  Have you or a family member traveled in the last 14 days and where? yes/no: No  If the patient indicates "YES" to the above questions, their PAT will be rescheduled to limit the exposure to others and, the surgeon will be notified. THE PATIENT WILL NEED TO BE ASYMPTOMATIC FOR 14 DAYS.   If the patient is not experiencing any of these symptoms, the PAT nurse will instruct them to NOT bring anyone with them to their appointment since they may have these symptoms or traveled as well.   Please remind your patients and families that hospital visitation restrictions are in effect and the importance of the restrictions.   Patient denies shortness of breath, fever, cough and chest pain at PAT appointment  Patient verbalized understanding of instructions that were given to them at the PAT appointment. Patient was also instructed that they will need to review over the PAT instructions again at home before surgery.

## 2018-11-20 NOTE — Telephone Encounter (Signed)
Called patient and reviewed findings on CTA . Also noted that follow up for 7 and 11 mm vascular lesions of liver. Patient nor wife had any other questions concerning planned surgery in am Thomas Isaac MD      [address] [place] [address] Office [phone]   [place]

## 2018-11-21 ENCOUNTER — Inpatient Hospital Stay (HOSPITAL_COMMUNITY): Payer: Medicare HMO

## 2018-11-21 ENCOUNTER — Inpatient Hospital Stay (HOSPITAL_COMMUNITY)
Admission: RE | Disposition: A | Discharge status: Home / Self Care | Payer: Self-pay | Source: Home / Self Care | Attending: Cardiothoracic Surgery

## 2018-11-21 ENCOUNTER — Inpatient Hospital Stay (HOSPITAL_COMMUNITY): Payer: Medicare HMO | Admitting: Anesthesiology

## 2018-11-21 ENCOUNTER — Inpatient Hospital Stay (HOSPITAL_COMMUNITY)
Admission: RE | Admit: 2018-11-21 | Discharge: 2018-11-25 | DRG: 236 | Disposition: A | Discharge status: Home / Self Care | Payer: Medicare HMO | Attending: Cardiothoracic Surgery | Admitting: Cardiothoracic Surgery

## 2018-11-21 ENCOUNTER — Other Ambulatory Visit: Payer: Self-pay

## 2018-11-21 ENCOUNTER — Inpatient Hospital Stay (HOSPITAL_COMMUNITY): Payer: Medicare HMO | Admitting: Physician Assistant

## 2018-11-21 ENCOUNTER — Encounter (HOSPITAL_COMMUNITY): Payer: Self-pay | Admitting: *Deleted

## 2018-11-21 DIAGNOSIS — I77819 Aortic ectasia, unspecified site: Secondary | ICD-10-CM | POA: Diagnosis present

## 2018-11-21 DIAGNOSIS — E785 Hyperlipidemia, unspecified: Secondary | ICD-10-CM | POA: Diagnosis present

## 2018-11-21 DIAGNOSIS — I77810 Thoracic aortic ectasia: Secondary | ICD-10-CM | POA: Diagnosis not present

## 2018-11-21 DIAGNOSIS — I371 Nonrheumatic pulmonary valve insufficiency: Secondary | ICD-10-CM | POA: Diagnosis present

## 2018-11-21 DIAGNOSIS — G8918 Other acute postprocedural pain: Secondary | ICD-10-CM

## 2018-11-21 DIAGNOSIS — J939 Pneumothorax, unspecified: Secondary | ICD-10-CM | POA: Diagnosis not present

## 2018-11-21 DIAGNOSIS — Z1159 Encounter for screening for other viral diseases: Secondary | ICD-10-CM | POA: Diagnosis not present

## 2018-11-21 DIAGNOSIS — I2510 Atherosclerotic heart disease of native coronary artery without angina pectoris: Secondary | ICD-10-CM

## 2018-11-21 DIAGNOSIS — D62 Acute posthemorrhagic anemia: Secondary | ICD-10-CM | POA: Diagnosis not present

## 2018-11-21 DIAGNOSIS — R0602 Shortness of breath: Secondary | ICD-10-CM | POA: Diagnosis present

## 2018-11-21 DIAGNOSIS — I25119 Atherosclerotic heart disease of native coronary artery with unspecified angina pectoris: Secondary | ICD-10-CM | POA: Diagnosis not present

## 2018-11-21 DIAGNOSIS — E7800 Pure hypercholesterolemia, unspecified: Secondary | ICD-10-CM | POA: Diagnosis not present

## 2018-11-21 DIAGNOSIS — Z881 Allergy status to other antibiotic agents status: Secondary | ICD-10-CM

## 2018-11-21 DIAGNOSIS — Z8614 Personal history of Methicillin resistant Staphylococcus aureus infection: Secondary | ICD-10-CM

## 2018-11-21 DIAGNOSIS — J90 Pleural effusion, not elsewhere classified: Secondary | ICD-10-CM | POA: Diagnosis not present

## 2018-11-21 DIAGNOSIS — Z951 Presence of aortocoronary bypass graft: Secondary | ICD-10-CM | POA: Diagnosis not present

## 2018-11-21 DIAGNOSIS — Z87442 Personal history of urinary calculi: Secondary | ICD-10-CM

## 2018-11-21 DIAGNOSIS — Z8249 Family history of ischemic heart disease and other diseases of the circulatory system: Secondary | ICD-10-CM

## 2018-11-21 DIAGNOSIS — I083 Combined rheumatic disorders of mitral, aortic and tricuspid valves: Secondary | ICD-10-CM | POA: Diagnosis not present

## 2018-11-21 DIAGNOSIS — I10 Essential (primary) hypertension: Secondary | ICD-10-CM | POA: Diagnosis not present

## 2018-11-21 DIAGNOSIS — E119 Type 2 diabetes mellitus without complications: Secondary | ICD-10-CM | POA: Diagnosis not present

## 2018-11-21 DIAGNOSIS — K769 Liver disease, unspecified: Secondary | ICD-10-CM | POA: Diagnosis present

## 2018-11-21 DIAGNOSIS — J9811 Atelectasis: Secondary | ICD-10-CM | POA: Diagnosis not present

## 2018-11-21 HISTORY — PX: TEE WITHOUT CARDIOVERSION: SHX5443

## 2018-11-21 HISTORY — PX: CORONARY ARTERY BYPASS GRAFT: SHX141

## 2018-11-21 LAB — CBC
HCT: 32.3 % — ABNORMAL LOW (ref 39.0–52.0)
HCT: 34.9 % — ABNORMAL LOW (ref 39.0–52.0)
Hemoglobin: 11.1 g/dL — ABNORMAL LOW (ref 13.0–17.0)
Hemoglobin: 11.6 g/dL — ABNORMAL LOW (ref 13.0–17.0)
MCH: 29.8 pg (ref 26.0–34.0)
MCH: 29.1 pg (ref 26.0–34.0)
MCHC: 34.4 g/dL (ref 30.0–36.0)
MCHC: 33.2 g/dL (ref 30.0–36.0)
MCV: 86.8 fL (ref 80.0–100.0)
MCV: 87.5 fL (ref 80.0–100.0)
Platelets: 114 10*3/uL — ABNORMAL LOW (ref 150–400)
Platelets: 143 10*3/uL — ABNORMAL LOW (ref 150–400)
RBC: 3.72 MIL/uL — ABNORMAL LOW (ref 4.22–5.81)
RBC: 3.99 MIL/uL — ABNORMAL LOW (ref 4.22–5.81)
RDW: 13.2 % (ref 11.5–15.5)
RDW: 13.4 % (ref 11.5–15.5)
WBC: 10 10*3/uL (ref 4.0–10.5)
WBC: 12.7 10*3/uL — ABNORMAL HIGH (ref 4.0–10.5)
nRBC: 0 % (ref 0.0–0.2)
nRBC: 0 % (ref 0.0–0.2)

## 2018-11-21 LAB — APTT: aPTT: 28 seconds (ref 24–36)

## 2018-11-21 LAB — GLUCOSE, CAPILLARY
Glucose-Capillary: 111 mg/dL — ABNORMAL HIGH (ref 70–99)
Glucose-Capillary: 125 mg/dL — ABNORMAL HIGH (ref 70–99)
Glucose-Capillary: 128 mg/dL — ABNORMAL HIGH (ref 70–99)
Glucose-Capillary: 128 mg/dL — ABNORMAL HIGH (ref 70–99)
Glucose-Capillary: 128 mg/dL — ABNORMAL HIGH (ref 70–99)
Glucose-Capillary: 132 mg/dL — ABNORMAL HIGH (ref 70–99)
Glucose-Capillary: 135 mg/dL — ABNORMAL HIGH (ref 70–99)
Glucose-Capillary: 139 mg/dL — ABNORMAL HIGH (ref 70–99)
Glucose-Capillary: 141 mg/dL — ABNORMAL HIGH (ref 70–99)
Glucose-Capillary: 147 mg/dL — ABNORMAL HIGH (ref 70–99)
Glucose-Capillary: 147 mg/dL — ABNORMAL HIGH (ref 70–99)
Glucose-Capillary: 210 mg/dL — ABNORMAL HIGH (ref 70–99)

## 2018-11-21 LAB — POCT I-STAT 7, (LYTES, BLD GAS, ICA,H+H)
Acid-base deficit: 1 mmol/L (ref 0.0–2.0)
Acid-base deficit: 4 mmol/L — ABNORMAL HIGH (ref 0.0–2.0)
Acid-base deficit: 3 mmol/L — ABNORMAL HIGH (ref 0.0–2.0)
Bicarbonate: 23.7 mmol/L (ref 20.0–28.0)
Bicarbonate: 21.9 mmol/L (ref 20.0–28.0)
Bicarbonate: 22.5 mmol/L (ref 20.0–28.0)
Calcium, Ion: 1.1 mmol/L — ABNORMAL LOW (ref 1.15–1.40)
Calcium, Ion: 1.07 mmol/L — ABNORMAL LOW (ref 1.15–1.40)
Calcium, Ion: 1.07 mmol/L — ABNORMAL LOW (ref 1.15–1.40)
HCT: 34 % — ABNORMAL LOW (ref 39.0–52.0)
HCT: 35 % — ABNORMAL LOW (ref 39.0–52.0)
HCT: 34 % — ABNORMAL LOW (ref 39.0–52.0)
Hemoglobin: 11.6 g/dL — ABNORMAL LOW (ref 13.0–17.0)
Hemoglobin: 11.9 g/dL — ABNORMAL LOW (ref 13.0–17.0)
Hemoglobin: 11.6 g/dL — ABNORMAL LOW (ref 13.0–17.0)
O2 Saturation: 99 %
O2 Saturation: 98 %
O2 Saturation: 99 %
Patient temperature: 35.4
Patient temperature: 37.5
Patient temperature: 37.9
Potassium: 3.4 mmol/L — ABNORMAL LOW (ref 3.5–5.1)
Potassium: 4.4 mmol/L (ref 3.5–5.1)
Potassium: 4.3 mmol/L (ref 3.5–5.1)
Sodium: 144 mmol/L (ref 135–145)
Sodium: 143 mmol/L (ref 135–145)
Sodium: 143 mmol/L (ref 135–145)
TCO2: 25 mmol/L (ref 22–32)
TCO2: 23 mmol/L (ref 22–32)
TCO2: 24 mmol/L (ref 22–32)
pCO2 arterial: 35.3 mmHg (ref 32.0–48.0)
pCO2 arterial: 42.3 mmHg (ref 32.0–48.0)
pCO2 arterial: 43.3 mmHg (ref 32.0–48.0)
pH, Arterial: 7.429 (ref 7.350–7.450)
pH, Arterial: 7.324 — ABNORMAL LOW (ref 7.350–7.450)
pH, Arterial: 7.327 — ABNORMAL LOW (ref 7.350–7.450)
pO2, Arterial: 134 mmHg — ABNORMAL HIGH (ref 83.0–108.0)
pO2, Arterial: 122 mmHg — ABNORMAL HIGH (ref 83.0–108.0)
pO2, Arterial: 129 mmHg — ABNORMAL HIGH (ref 83.0–108.0)

## 2018-11-21 LAB — CREATININE, SERUM
Creatinine, Ser: 0.74 mg/dL (ref 0.61–1.24)
GFR calc Af Amer: >60 mL/min (ref >60)
GFR calc non Af Amer: >60 mL/min (ref >60)

## 2018-11-21 LAB — POCT I-STAT 4, (NA,K, GLUC, HGB,HCT)
Glucose, Bld: 132 mg/dL — ABNORMAL HIGH (ref 70–99)
Glucose, Bld: 139 mg/dL — ABNORMAL HIGH (ref 70–99)
HCT: 38 % — ABNORMAL LOW (ref 39.0–52.0)
HCT: 36 % — ABNORMAL LOW (ref 39.0–52.0)
Hemoglobin: 12.9 g/dL — ABNORMAL LOW (ref 13.0–17.0)
Hemoglobin: 12.2 g/dL — ABNORMAL LOW (ref 13.0–17.0)
Potassium: 3.7 mmol/L (ref 3.5–5.1)
Potassium: 3.9 mmol/L (ref 3.5–5.1)
Sodium: 142 mmol/L (ref 135–145)
Sodium: 142 mmol/L (ref 135–145)

## 2018-11-21 LAB — POCT I-STAT, CHEM 8
BUN: 11 mg/dL (ref 8–23)
Calcium, Ion: 1.07 mmol/L — ABNORMAL LOW (ref 1.15–1.40)
Chloride: 107 mmol/L (ref 98–111)
Creatinine, Ser: 0.7 mg/dL (ref 0.61–1.24)
Glucose, Bld: 126 mg/dL — ABNORMAL HIGH (ref 70–99)
HCT: 32 % — ABNORMAL LOW (ref 39.0–52.0)
Hemoglobin: 10.9 g/dL — ABNORMAL LOW (ref 13.0–17.0)
Potassium: 4.1 mmol/L (ref 3.5–5.1)
Sodium: 140 mmol/L (ref 135–145)
TCO2: 21 mmol/L — ABNORMAL LOW (ref 22–32)

## 2018-11-21 LAB — HEMOGLOBIN AND HEMATOCRIT, BLOOD
HCT: 32.8 % — ABNORMAL LOW (ref 39.0–52.0)
Hemoglobin: 11.2 g/dL — ABNORMAL LOW (ref 13.0–17.0)

## 2018-11-21 LAB — MAGNESIUM: Magnesium: 3.1 mg/dL — ABNORMAL HIGH (ref 1.7–2.4)

## 2018-11-21 LAB — ECHO INTRAOPERATIVE TEE

## 2018-11-21 LAB — PROTIME-INR
INR: 1.6 — ABNORMAL HIGH (ref 0.8–1.2)
Prothrombin Time: 18.4 seconds — ABNORMAL HIGH (ref 11.4–15.2)

## 2018-11-21 LAB — PLATELET COUNT: Platelets: 140 10*3/uL — ABNORMAL LOW (ref 150–400)

## 2018-11-21 SURGERY — CORONARY ARTERY BYPASS GRAFTING (CABG)
Anesthesia: General | Site: Chest

## 2018-11-21 MED ORDER — LACTATED RINGERS IV SOLN: 500.0000 mL | Freq: Once | INTRAVENOUS | Status: DC | PRN

## 2018-11-21 MED ORDER — LACTATED RINGERS IV SOLN
INTRAVENOUS | Status: DC | PRN
  Administered 2018-11-21: 08:00:00 via INTRAVENOUS

## 2018-11-21 MED ORDER — BISACODYL 10 MG RE SUPP
10.0000 mg | Freq: Every day | RECTAL | Status: DC
  Filled 2018-11-21: qty 1

## 2018-11-21 MED ORDER — VANCOMYCIN HCL IN DEXTROSE 1-5 GM/200ML-% IV SOLN
1000.0000 mg | Freq: Once | INTRAVENOUS | Status: AC
  Administered 2018-11-21: 1000 mg via INTRAVENOUS
  Filled 2018-11-21: qty 200

## 2018-11-21 MED ORDER — METOPROLOL TARTRATE 25 MG/10 ML ORAL SUSPENSION: 12.5000 mg | Freq: Two times a day (BID) | ORAL | Status: DC

## 2018-11-21 MED ORDER — FENTANYL CITRATE (PF) 250 MCG/5ML IJ SOLN
INTRAMUSCULAR | Status: AC
  Filled 2018-11-21: qty 25

## 2018-11-21 MED ORDER — FENTANYL CITRATE (PF) 250 MCG/5ML IJ SOLN
INTRAMUSCULAR | Status: DC | PRN
  Administered 2018-11-21 (×4): 150 ug via INTRAVENOUS
  Administered 2018-11-21: 50 ug via INTRAVENOUS
  Administered 2018-11-21: 150 ug via INTRAVENOUS
  Administered 2018-11-21: 100 ug via INTRAVENOUS
  Administered 2018-11-21: 150 ug via INTRAVENOUS
  Administered 2018-11-21: 200 ug via INTRAVENOUS

## 2018-11-21 MED ORDER — MIDAZOLAM HCL 2 MG/2ML IJ SOLN
2.0000 mg | INTRAMUSCULAR | Status: DC | PRN
  Filled 2018-11-21: qty 2

## 2018-11-21 MED ORDER — CHLORHEXIDINE GLUCONATE 4 % EX LIQD: 30.0000 mL | CUTANEOUS | Status: DC

## 2018-11-21 MED ORDER — PROTAMINE SULFATE 10 MG/ML IV SOLN
INTRAVENOUS | Status: DC | PRN
  Administered 2018-11-21: 300 mg via INTRAVENOUS

## 2018-11-21 MED ORDER — ORAL CARE MOUTH RINSE
15.0000 mL | OROMUCOSAL | Status: DC
  Administered 2018-11-21 – 2018-11-22 (×6): 15 mL via OROMUCOSAL

## 2018-11-21 MED ORDER — CHLORHEXIDINE GLUCONATE CLOTH 2 % EX PADS
6.0000 | MEDICATED_PAD | Freq: Every day | CUTANEOUS | Status: DC
  Administered 2018-11-21: 6 via TOPICAL

## 2018-11-21 MED ORDER — ONDANSETRON HCL 4 MG/2ML IJ SOLN: 4.0000 mg | Freq: Four times a day (QID) | INTRAMUSCULAR | Status: DC | PRN

## 2018-11-21 MED ORDER — LIDOCAINE 2% (20 MG/ML) 5 ML SYRINGE
INTRAMUSCULAR | Status: DC | PRN
  Administered 2018-11-21: 80 mg via INTRAVENOUS

## 2018-11-21 MED ORDER — TRAMADOL HCL 50 MG PO TABS
50.0000 mg | ORAL_TABLET | ORAL | Status: DC | PRN
  Administered 2018-11-23: 100 mg via ORAL
  Administered 2018-11-25: 50 mg via ORAL
  Filled 2018-11-21: qty 2
  Filled 2018-11-21: qty 1

## 2018-11-21 MED ORDER — MAGNESIUM SULFATE 4 GM/100ML IV SOLN
4.0000 g | Freq: Once | INTRAVENOUS | Status: AC
  Administered 2018-11-21: 4 g via INTRAVENOUS
  Filled 2018-11-21: qty 100

## 2018-11-21 MED ORDER — CHLORHEXIDINE GLUCONATE 0.12 % MT SOLN
15.0000 mL | OROMUCOSAL | Status: AC
  Administered 2018-11-21: 15 mL via OROMUCOSAL

## 2018-11-21 MED ORDER — HEMOSTATIC AGENTS (NO CHARGE) OPTIME
TOPICAL | Status: DC | PRN
  Administered 2018-11-21: 1 via TOPICAL

## 2018-11-21 MED ORDER — SODIUM CHLORIDE 0.9 % IV SOLN
INTRAVENOUS | Status: DC | PRN
  Administered 2018-11-21: 750 mg via INTRAVENOUS

## 2018-11-21 MED ORDER — SODIUM CHLORIDE 0.9% FLUSH
10.0000 mL | Freq: Two times a day (BID) | INTRAVENOUS | Status: DC
  Administered 2018-11-22 – 2018-11-24 (×5): 10 mL

## 2018-11-21 MED ORDER — HEPARIN SODIUM (PORCINE) 1000 UNIT/ML IJ SOLN
INTRAMUSCULAR | Status: AC
  Filled 2018-11-21: qty 1

## 2018-11-21 MED ORDER — CHLORHEXIDINE GLUCONATE 0.12% ORAL RINSE (MEDLINE KIT)
15.0000 mL | Freq: Two times a day (BID) | OROMUCOSAL | Status: DC
  Administered 2018-11-21: 15 mL via OROMUCOSAL

## 2018-11-21 MED ORDER — SODIUM CHLORIDE 0.9% FLUSH
3.0000 mL | Freq: Two times a day (BID) | INTRAVENOUS | Status: DC
  Administered 2018-11-22 – 2018-11-23 (×3): 3 mL via INTRAVENOUS

## 2018-11-21 MED ORDER — MIDAZOLAM HCL 5 MG/5ML IJ SOLN
INTRAMUSCULAR | Status: DC | PRN
  Administered 2018-11-21: 2 mg via INTRAVENOUS
  Administered 2018-11-21: 1 mg via INTRAVENOUS
  Administered 2018-11-21: 2 mg via INTRAVENOUS
  Administered 2018-11-21 (×3): 1 mg via INTRAVENOUS
  Administered 2018-11-21: 2 mg via INTRAVENOUS

## 2018-11-21 MED ORDER — ACETAMINOPHEN 160 MG/5ML PO SOLN: 1000.0000 mg | Freq: Four times a day (QID) | ORAL | Status: DC

## 2018-11-21 MED ORDER — DOCUSATE SODIUM 100 MG PO CAPS
200.0000 mg | ORAL_CAPSULE | Freq: Every day | ORAL | Status: DC
  Administered 2018-11-22 – 2018-11-23 (×2): 200 mg via ORAL
  Filled 2018-11-21 (×2): qty 2

## 2018-11-21 MED ORDER — INSULIN REGULAR(HUMAN) IN NACL 100-0.9 UT/100ML-% IV SOLN
INTRAVENOUS | Status: DC
  Administered 2018-11-21: 1.4 [IU]/h via INTRAVENOUS

## 2018-11-21 MED ORDER — ASPIRIN 81 MG PO CHEW
324.0000 mg | CHEWABLE_TABLET | Freq: Every day | ORAL | Status: DC
  Administered 2018-11-25: 324 mg
  Filled 2018-11-21: qty 4

## 2018-11-21 MED ORDER — PROPOFOL 10 MG/ML IV BOLUS
INTRAVENOUS | Status: DC | PRN
  Administered 2018-11-21: 100 mg via INTRAVENOUS

## 2018-11-21 MED ORDER — CHLORHEXIDINE GLUCONATE 0.12 % MT SOLN
OROMUCOSAL | Status: AC
  Administered 2018-11-21: 15 mL
  Filled 2018-11-21: qty 15

## 2018-11-21 MED ORDER — ACETAMINOPHEN 160 MG/5ML PO SOLN: 650.0000 mg | Freq: Once | ORAL | Status: AC

## 2018-11-21 MED ORDER — METOPROLOL TARTRATE 5 MG/5ML IV SOLN: 2.5000 mg | INTRAVENOUS | Status: DC | PRN

## 2018-11-21 MED ORDER — OXYCODONE HCL 5 MG PO TABS
5.0000 mg | ORAL_TABLET | ORAL | Status: DC | PRN
  Administered 2018-11-21: 10 mg via ORAL
  Administered 2018-11-22: 5 mg via ORAL
  Administered 2018-11-22: 10 mg via ORAL
  Filled 2018-11-21: qty 2
  Filled 2018-11-21: qty 1
  Filled 2018-11-21: qty 2

## 2018-11-21 MED ORDER — PHENYLEPHRINE 40 MCG/ML (10ML) SYRINGE FOR IV PUSH (FOR BLOOD PRESSURE SUPPORT)
PREFILLED_SYRINGE | INTRAVENOUS | Status: DC | PRN
  Administered 2018-11-21 (×3): 40 ug via INTRAVENOUS

## 2018-11-21 MED ORDER — PROTAMINE SULFATE 10 MG/ML IV SOLN
INTRAVENOUS | Status: AC
  Filled 2018-11-21: qty 5

## 2018-11-21 MED ORDER — SODIUM CHLORIDE 0.9% FLUSH: 10.0000 mL | INTRAVENOUS | Status: DC | PRN

## 2018-11-21 MED ORDER — ASPIRIN EC 325 MG PO TBEC
325.0000 mg | DELAYED_RELEASE_TABLET | Freq: Every day | ORAL | Status: DC
  Administered 2018-11-22 – 2018-11-24 (×3): 325 mg via ORAL
  Filled 2018-11-21 (×4): qty 1

## 2018-11-21 MED ORDER — EPHEDRINE SULFATE-NACL 50-0.9 MG/10ML-% IV SOSY
PREFILLED_SYRINGE | INTRAVENOUS | Status: DC | PRN
  Administered 2018-11-21: 10 mg via INTRAVENOUS

## 2018-11-21 MED ORDER — ROCURONIUM BROMIDE 10 MG/ML (PF) SYRINGE
PREFILLED_SYRINGE | INTRAVENOUS | Status: AC
  Filled 2018-11-21: qty 30

## 2018-11-21 MED ORDER — ALBUMIN HUMAN 5 % IV SOLN
250.0000 mL | INTRAVENOUS | Status: DC | PRN
  Administered 2018-11-21 (×3): 12.5 g via INTRAVENOUS
  Filled 2018-11-21: qty 250

## 2018-11-21 MED ORDER — MORPHINE SULFATE (PF) 2 MG/ML IV SOLN
1.0000 mg | INTRAVENOUS | Status: DC | PRN
  Administered 2018-11-21: 4 mg via INTRAVENOUS
  Administered 2018-11-21: 2 mg via INTRAVENOUS
  Administered 2018-11-21 – 2018-11-22 (×5): 4 mg via INTRAVENOUS
  Filled 2018-11-21: qty 1
  Filled 2018-11-21 (×6): qty 2

## 2018-11-21 MED ORDER — METOPROLOL TARTRATE 12.5 MG HALF TABLET
12.5000 mg | ORAL_TABLET | Freq: Once | ORAL | Status: AC
  Administered 2018-11-21: 12.5 mg via ORAL

## 2018-11-21 MED ORDER — PROTAMINE SULFATE 10 MG/ML IV SOLN
INTRAVENOUS | Status: AC
  Filled 2018-11-21: qty 25

## 2018-11-21 MED ORDER — PHENYLEPHRINE HCL-NACL 20-0.9 MG/250ML-% IV SOLN
0.0000 ug/min | INTRAVENOUS | Status: DC
  Administered 2018-11-21: 20 ug/min via INTRAVENOUS

## 2018-11-21 MED ORDER — DEXAMETHASONE SODIUM PHOSPHATE 10 MG/ML IJ SOLN
INTRAMUSCULAR | Status: AC
  Filled 2018-11-21: qty 1

## 2018-11-21 MED ORDER — ROCURONIUM BROMIDE 10 MG/ML (PF) SYRINGE
PREFILLED_SYRINGE | INTRAVENOUS | Status: DC | PRN
  Administered 2018-11-21 (×2): 50 mg via INTRAVENOUS
  Administered 2018-11-21: 100 mg via INTRAVENOUS

## 2018-11-21 MED ORDER — LACTATED RINGERS IV SOLN
INTRAVENOUS | Status: DC | PRN
  Administered 2018-11-21 (×3): via INTRAVENOUS

## 2018-11-21 MED ORDER — POTASSIUM CHLORIDE 10 MEQ/50ML IV SOLN
10.0000 meq | INTRAVENOUS | Status: AC
  Administered 2018-11-21 (×3): 10 meq via INTRAVENOUS

## 2018-11-21 MED ORDER — METOPROLOL TARTRATE 12.5 MG HALF TABLET
ORAL_TABLET | ORAL | Status: AC
  Administered 2018-11-21: 12.5 mg via ORAL
  Filled 2018-11-21: qty 1

## 2018-11-21 MED ORDER — INSULIN REGULAR BOLUS VIA INFUSION
0.0000 [IU] | Freq: Three times a day (TID) | INTRAVENOUS | Status: DC
  Filled 2018-11-21: qty 10

## 2018-11-21 MED ORDER — NITROGLYCERIN IN D5W 200-5 MCG/ML-% IV SOLN: 0.0000 ug/min | INTRAVENOUS | Status: DC

## 2018-11-21 MED ORDER — SODIUM CHLORIDE 0.45 % IV SOLN
INTRAVENOUS | Status: DC | PRN
  Administered 2018-11-21: 15:00:00 via INTRAVENOUS

## 2018-11-21 MED ORDER — MIDAZOLAM HCL (PF) 10 MG/2ML IJ SOLN
INTRAMUSCULAR | Status: AC
  Filled 2018-11-21: qty 2

## 2018-11-21 MED ORDER — ONDANSETRON HCL 4 MG/2ML IJ SOLN
INTRAMUSCULAR | Status: AC
  Filled 2018-11-21: qty 2

## 2018-11-21 MED ORDER — ALBUMIN HUMAN 5 % IV SOLN
INTRAVENOUS | Status: DC | PRN
  Administered 2018-11-21 (×2): via INTRAVENOUS

## 2018-11-21 MED ORDER — LIDOCAINE 2% (20 MG/ML) 5 ML SYRINGE
INTRAMUSCULAR | Status: AC
  Filled 2018-11-21: qty 5

## 2018-11-21 MED ORDER — ROSUVASTATIN CALCIUM 20 MG PO TABS
40.0000 mg | ORAL_TABLET | Freq: Every day | ORAL | Status: DC
  Administered 2018-11-22: 40 mg via ORAL
  Administered 2018-11-23: 20 mg via ORAL
  Administered 2018-11-24 – 2018-11-25 (×2): 40 mg via ORAL
  Filled 2018-11-21 (×4): qty 2

## 2018-11-21 MED ORDER — PROPOFOL 10 MG/ML IV BOLUS
INTRAVENOUS | Status: AC
  Filled 2018-11-21: qty 20

## 2018-11-21 MED ORDER — HEPARIN SODIUM (PORCINE) 1000 UNIT/ML IJ SOLN
INTRAMUSCULAR | Status: DC | PRN
  Administered 2018-11-21: 30000 [IU] via INTRAVENOUS

## 2018-11-21 MED ORDER — SUCCINYLCHOLINE CHLORIDE 200 MG/10ML IV SOSY
PREFILLED_SYRINGE | INTRAVENOUS | Status: AC
  Filled 2018-11-21: qty 10

## 2018-11-21 MED ORDER — SODIUM CHLORIDE (PF) 0.9 % IJ SOLN
OROMUCOSAL | Status: DC | PRN
  Administered 2018-11-21 (×3): 4 mL via TOPICAL

## 2018-11-21 MED ORDER — 0.9 % SODIUM CHLORIDE (POUR BTL) OPTIME
TOPICAL | Status: DC | PRN
  Administered 2018-11-21: 5000 mL

## 2018-11-21 MED ORDER — PANTOPRAZOLE SODIUM 40 MG PO TBEC
40.0000 mg | DELAYED_RELEASE_TABLET | Freq: Every day | ORAL | Status: DC
  Administered 2018-11-23 – 2018-11-25 (×3): 40 mg via ORAL
  Filled 2018-11-21 (×3): qty 1

## 2018-11-21 MED ORDER — CHLORHEXIDINE GLUCONATE 0.12 % MT SOLN: 15.0000 mL | Freq: Once | OROMUCOSAL | Status: DC

## 2018-11-21 MED ORDER — SODIUM CHLORIDE 0.9 % IV SOLN: 250.0000 mL | INTRAVENOUS | Status: DC

## 2018-11-21 MED ORDER — EPHEDRINE 5 MG/ML INJ
INTRAVENOUS | Status: AC
  Filled 2018-11-21: qty 20

## 2018-11-21 MED ORDER — SODIUM CHLORIDE 0.9 % IV SOLN
1.5000 g | Freq: Two times a day (BID) | INTRAVENOUS | Status: AC
  Administered 2018-11-21 – 2018-11-23 (×4): 1.5 g via INTRAVENOUS
  Filled 2018-11-21 (×4): qty 1.5

## 2018-11-21 MED ORDER — FAMOTIDINE IN NACL 20-0.9 MG/50ML-% IV SOLN
20.0000 mg | Freq: Two times a day (BID) | INTRAVENOUS | Status: AC
  Administered 2018-11-21 (×2): 20 mg via INTRAVENOUS
  Filled 2018-11-21: qty 50

## 2018-11-21 MED ORDER — BISACODYL 5 MG PO TBEC
10.0000 mg | DELAYED_RELEASE_TABLET | Freq: Every day | ORAL | Status: DC
  Administered 2018-11-22 – 2018-11-23 (×2): 10 mg via ORAL
  Filled 2018-11-21 (×3): qty 2

## 2018-11-21 MED ORDER — ACETAMINOPHEN 650 MG RE SUPP
650.0000 mg | Freq: Once | RECTAL | Status: AC
  Administered 2018-11-21: 650 mg via RECTAL

## 2018-11-21 MED ORDER — LACTATED RINGERS IV SOLN
INTRAVENOUS | Status: DC
  Administered 2018-11-21: 15:00:00 via INTRAVENOUS

## 2018-11-21 MED ORDER — SODIUM CHLORIDE 0.9 % IV SOLN
INTRAVENOUS | Status: DC
  Administered 2018-11-21: 15:00:00 via INTRAVENOUS

## 2018-11-21 MED ORDER — METOCLOPRAMIDE HCL 5 MG/ML IJ SOLN
10.0000 mg | Freq: Four times a day (QID) | INTRAMUSCULAR | Status: DC
  Administered 2018-11-21 – 2018-11-23 (×7): 10 mg via INTRAVENOUS
  Filled 2018-11-21 (×7): qty 2

## 2018-11-21 MED ORDER — ACETAMINOPHEN 500 MG PO TABS
1000.0000 mg | ORAL_TABLET | Freq: Four times a day (QID) | ORAL | Status: DC
  Administered 2018-11-21 – 2018-11-25 (×11): 1000 mg via ORAL
  Filled 2018-11-21 (×14): qty 2

## 2018-11-21 MED ORDER — DEXMEDETOMIDINE HCL IN NACL 200 MCG/50ML IV SOLN
0.0000 ug/kg/h | INTRAVENOUS | Status: DC
  Administered 2018-11-21: 0.7 ug/kg/h via INTRAVENOUS
  Filled 2018-11-21: qty 50

## 2018-11-21 MED ORDER — PHENYLEPHRINE 40 MCG/ML (10ML) SYRINGE FOR IV PUSH (FOR BLOOD PRESSURE SUPPORT)
PREFILLED_SYRINGE | INTRAVENOUS | Status: AC
  Filled 2018-11-21: qty 30

## 2018-11-21 MED ORDER — LACTATED RINGERS IV SOLN: INTRAVENOUS | Status: DC

## 2018-11-21 MED ORDER — METOPROLOL TARTRATE 12.5 MG HALF TABLET
12.5000 mg | ORAL_TABLET | Freq: Two times a day (BID) | ORAL | Status: DC
  Administered 2018-11-22 – 2018-11-25 (×7): 12.5 mg via ORAL
  Filled 2018-11-21 (×7): qty 1

## 2018-11-21 MED ORDER — CHLORHEXIDINE GLUCONATE CLOTH 2 % EX PADS
6.0000 | MEDICATED_PAD | Freq: Every day | CUTANEOUS | Status: DC
  Administered 2018-11-22: 6 via TOPICAL

## 2018-11-21 MED ORDER — SODIUM CHLORIDE 0.9% FLUSH: 3.0000 mL | INTRAVENOUS | Status: DC | PRN

## 2018-11-21 SURGICAL SUPPLY — 88 items
ADAPTER CARDIO PERF ANTE/RETRO (ADAPTER) IMPLANT
APPLICATOR TIP COSEAL (VASCULAR PRODUCTS) IMPLANT
BAG DECANTER FOR FLEXI CONT (MISCELLANEOUS) ×4 IMPLANT
BANDAGE ACE 4X5 VEL STRL LF (GAUZE/BANDAGES/DRESSINGS) ×4 IMPLANT
BANDAGE ACE 6X5 VEL STRL LF (GAUZE/BANDAGES/DRESSINGS) ×4 IMPLANT
BLADE STERNUM SYSTEM 6 (BLADE) ×4 IMPLANT
BLADE SURG 11 STRL SS (BLADE) ×4 IMPLANT
BLADE SURG 15 STRL LF DISP TIS (BLADE) ×2 IMPLANT
BLADE SURG 15 STRL SS (BLADE) ×2
BNDG GAUZE ELAST 4 BULKY (GAUZE/BANDAGES/DRESSINGS) ×4 IMPLANT
CANISTER SUCT 3000ML PPV (MISCELLANEOUS) ×4 IMPLANT
CANNULA GUNDRY RCSP 15FR (MISCELLANEOUS) IMPLANT
CATH CPB KIT GERHARDT (MISCELLANEOUS) ×4 IMPLANT
CATH FOLEY 2WAY SLVR 18FR 30CC (CATHETERS) IMPLANT
CATH HEART VENT LEFT (CATHETERS) IMPLANT
CATH THORACIC 28FR (CATHETERS) ×4 IMPLANT
CATH/SQUID NICHOLS JEHLE COR (CATHETERS) IMPLANT
COVER WAND RF STERILE (DRAPES) IMPLANT
CRADLE DONUT ADULT HEAD (MISCELLANEOUS) ×4 IMPLANT
DRAIN CHANNEL 28F RND 3/8 FF (WOUND CARE) ×4 IMPLANT
DRAPE CARDIOVASCULAR INCISE (DRAPES) ×2
DRAPE SLUSH/WARMER DISC (DRAPES) ×4 IMPLANT
DRAPE SRG 135X102X78XABS (DRAPES) ×2 IMPLANT
DRSG AQUACEL AG ADV 3.5X14 (GAUZE/BANDAGES/DRESSINGS) ×4 IMPLANT
ELECT BLADE 4.0 EZ CLEAN MEGAD (MISCELLANEOUS) ×4
ELECT CAUTERY BLADE 6.4 (BLADE) ×4 IMPLANT
ELECT REM PT RETURN 9FT ADLT (ELECTROSURGICAL) ×8
ELECTRODE BLDE 4.0 EZ CLN MEGD (MISCELLANEOUS) ×2 IMPLANT
ELECTRODE REM PT RTRN 9FT ADLT (ELECTROSURGICAL) ×4 IMPLANT
FELT TEFLON 1X6 (MISCELLANEOUS) ×4 IMPLANT
GAUZE SPONGE 4X4 12PLY STRL (GAUZE/BANDAGES/DRESSINGS) ×8 IMPLANT
GLOVE BIO SURGEON STRL SZ 6 (GLOVE) ×4 IMPLANT
GLOVE BIO SURGEON STRL SZ 6.5 (GLOVE) ×9 IMPLANT
GLOVE BIO SURGEONS STRL SZ 6.5 (GLOVE) ×3
GLOVE BIOGEL PI IND STRL 7.0 (GLOVE) ×6 IMPLANT
GLOVE BIOGEL PI INDICATOR 7.0 (GLOVE) ×6
GOWN STRL REUS W/ TWL LRG LVL3 (GOWN DISPOSABLE) ×8 IMPLANT
GOWN STRL REUS W/TWL LRG LVL3 (GOWN DISPOSABLE) ×8
HEMOSTAT POWDER SURGIFOAM 1G (HEMOSTASIS) ×12 IMPLANT
HEMOSTAT SURGICEL 2X14 (HEMOSTASIS) ×4 IMPLANT
INSERT FOGARTY XLG (MISCELLANEOUS) IMPLANT
KIT BASIN OR (CUSTOM PROCEDURE TRAY) ×4 IMPLANT
KIT CATH SUCT 8FR (CATHETERS) ×4 IMPLANT
KIT SUCTION CATH 14FR (SUCTIONS) ×12 IMPLANT
KIT TURNOVER KIT B (KITS) ×4 IMPLANT
KIT VASOVIEW HEMOPRO 2 VH 4000 (KITS) ×4 IMPLANT
LEAD PACING MYOCARDI (MISCELLANEOUS) ×4 IMPLANT
LINE VENT (MISCELLANEOUS) ×4 IMPLANT
MARKER GRAFT CORONARY BYPASS (MISCELLANEOUS) ×12 IMPLANT
NS IRRIG 1000ML POUR BTL (IV SOLUTION) ×20 IMPLANT
PACK E OPEN HEART (SUTURE) ×4 IMPLANT
PACK OPEN HEART (CUSTOM PROCEDURE TRAY) ×4 IMPLANT
PAD ARMBOARD 7.5X6 YLW CONV (MISCELLANEOUS) ×8 IMPLANT
PAD ELECT DEFIB RADIOL ZOLL (MISCELLANEOUS) ×4 IMPLANT
PENCIL BUTTON HOLSTER BLD 10FT (ELECTRODE) ×4 IMPLANT
PUNCH AORTIC ROTATE  4.5MM 8IN (MISCELLANEOUS) ×4 IMPLANT
SEALANT SURG COSEAL 8ML (VASCULAR PRODUCTS) IMPLANT
SET CARDIOPLEGIA MPS 5001102 (MISCELLANEOUS) ×4 IMPLANT
SOLUTION ANTI FOG 6CC (MISCELLANEOUS) ×4 IMPLANT
SPONGE LAP 18X18 RF (DISPOSABLE) ×8 IMPLANT
SURGIFLO W/THROMBIN 8M KIT (HEMOSTASIS) ×4 IMPLANT
SUT BONE WAX W31G (SUTURE) ×4 IMPLANT
SUT ETHIBOND 2 0 SH (SUTURE) ×2
SUT ETHIBOND 2 0 SH 36X2 (SUTURE) ×2 IMPLANT
SUT MNCRL AB 4-0 PS2 18 (SUTURE) ×4 IMPLANT
SUT PROLENE 3 0 RB 1 (SUTURE) ×4 IMPLANT
SUT PROLENE 3 0 SH 48 (SUTURE) IMPLANT
SUT PROLENE 3 0 SH1 36 (SUTURE) ×4 IMPLANT
SUT PROLENE 4 0 RB 1 (SUTURE) ×4
SUT PROLENE 4 0 TF (SUTURE) ×8 IMPLANT
SUT PROLENE 4-0 RB1 .5 CRCL 36 (SUTURE) ×4 IMPLANT
SUT PROLENE 6 0 CC (SUTURE) ×8 IMPLANT
SUT PROLENE 7 0 BV1 MDA (SUTURE) ×12 IMPLANT
SUT PROLENE 8 0 BV175 6 (SUTURE) ×8 IMPLANT
SUT STEEL 6MS V (SUTURE) ×4 IMPLANT
SUT STEEL SZ 6 DBL 3X14 BALL (SUTURE) ×4 IMPLANT
SUT VIC AB 1 CTX 18 (SUTURE) ×8 IMPLANT
SUT VIC AB 2-0 CT1 27 (SUTURE) ×2
SUT VIC AB 2-0 CT1 TAPERPNT 27 (SUTURE) ×2 IMPLANT
SYSTEM SAHARA CHEST DRAIN ATS (WOUND CARE) ×4 IMPLANT
TAPE CLOTH SURG 4X10 WHT LF (GAUZE/BANDAGES/DRESSINGS) ×4 IMPLANT
TOWEL GREEN STERILE (TOWEL DISPOSABLE) ×4 IMPLANT
TOWEL GREEN STERILE FF (TOWEL DISPOSABLE) ×4 IMPLANT
TRAY FOLEY SLVR 16FR TEMP STAT (SET/KITS/TRAYS/PACK) ×4 IMPLANT
TUBING LAP HI FLOW INSUFFLATIO (TUBING) ×4 IMPLANT
UNDERPAD 30X30 (UNDERPADS AND DIAPERS) ×4 IMPLANT
VENT LEFT HEART 12002 (CATHETERS)
WATER STERILE IRR 1000ML POUR (IV SOLUTION) ×8 IMPLANT

## 2018-11-21 NOTE — Anesthesia Procedure Notes (Signed)
Arterial Line Insertion Start/End6/03/2019 8:02 AM, 11/21/2018 8:07 AM Performed by: Catalina Gravel, MD, anesthesiologist  Patient location: Pre-op. Preanesthetic checklist: patient identified, IV checked, site marked, risks and benefits discussed, surgical consent, monitors and equipment checked, pre-op evaluation, timeout performed and anesthesia consent Lidocaine 1% used for infiltration Right, radial was placed Catheter size: 20 Fr Hand hygiene performed  and maximum sterile barriers used   Attempts: 1 Procedure performed using ultrasound guided technique. Ultrasound Notes:anatomy identified, needle tip was noted to be adjacent to the nerve/plexus identified, no ultrasound evidence of intravascular and/or intraneural injection and image(s) printed for medical record Following insertion, dressing applied and Biopatch. Post procedure assessment: normal and unchanged  Patient tolerated the procedure well with no immediate complications.

## 2018-11-21 NOTE — Anesthesia Procedure Notes (Signed)
Central Venous Catheter Insertion Performed by: Roberts Gaudy, MD, anesthesiologist Start/End6/03/2019 7:40 AM, 11/21/2018 7:50 AM Patient location: Pre-op. Preanesthetic checklist: patient identified, IV checked, site marked, risks and benefits discussed, surgical consent, monitors and equipment checked, pre-op evaluation, timeout performed and anesthesia consent Lidocaine 1% used for infiltration and patient sedated Hand hygiene performed  and maximum sterile barriers used  Catheter size: 9 Fr Sheath introducer Procedure performed using ultrasound guided technique. Ultrasound Notes:anatomy identified, needle tip was noted to be adjacent to the nerve/plexus identified, no ultrasound evidence of intravascular and/or intraneural injection and image(s) printed for medical record Attempts: 1 Following insertion, line sutured and dressing applied. Post procedure assessment: blood return through all ports, free fluid flow and no air  Patient tolerated the procedure well with no immediate complications.

## 2018-11-21 NOTE — Progress Notes (Signed)
Patient's wife Catalina Lunger called and updated on patient's status. Password set up as "Apolonio Schneiders".

## 2018-11-21 NOTE — Progress Notes (Signed)
Gerhardt MD made aware of lower CI over the past couple of hours. Okay to still wean to extubate.

## 2018-11-21 NOTE — Transfer of Care (Signed)
Immediate Anesthesia Transfer of Care Note  Patient: Thomas Washington  Procedure(s) Performed: CORONARY ARTERY BYPASS GRAFTING (CABG), ON PUMP, TIMES 4, USING LEFT INTERNAL MAMMARY ARTERY AND RIGHT GREATER SAPHENOUS VEIN, LIMA TO LAD, RIGHT SAPHENOUS VEIN TO OM2, RIGHT SAPHENOUS VEIN TO DISTAL CIRC, RIGHT SAPHENOUS VEIN TO ACUTE MARGINAL (N/A Chest) TRANSESOPHAGEAL ECHOCARDIOGRAM (TEE) (N/A )  Patient Location: ICU  Anesthesia Type:General  Level of Consciousness: sedated and Patient remains intubated per anesthesia plan  Airway & Oxygen Therapy: Patient remains intubated per anesthesia plan and Patient placed on Ventilator (see vital sign flow sheet for setting)  Post-op Assessment: Report given to RN and Post -op Vital signs reviewed and stable  Post vital signs: Reviewed and stable  Last Vitals:  Vitals Value Taken Time  BP    Temp    Pulse    Resp    SpO2      Last Pain:  Vitals:   11/21/18 0703  TempSrc:   PainSc: 0-No pain         Complications: No apparent anesthesia complications

## 2018-11-21 NOTE — Brief Op Note (Signed)
11/21/2018  10:42 AM  PATIENT:  Thomas Washington  68 y.o. male  PRE-OPERATIVE DIAGNOSIS:  CAD  POST-OPERATIVE DIAGNOSIS:  CAD  PROCEDURE:  Procedure(s): CORONARY ARTERY BYPASS GRAFTING (CABG), ON PUMP, TIMES 4, USING LEFT INTERNAL MAMMARY ARTERY AND RIGHT GREATER SAPHENOUS VEIN, LIMA TO LAD, RIGHT SAPHENOUS VEIN TO OM2, RIGHT SAPHENOUS VEIN TO DISTAL CIRC, RIGHT SAPHENOUS VEIN TO ACUTE MARGINAL (N/A) TRANSESOPHAGEAL ECHOCARDIOGRAM (TEE) (N/A)  LIMA to LAD SVG to OM1 and distal circumflex SVG to Acute Marginal   SURGEON:  Surgeon(s) and Role:    * Grace Isaac, MD - Primary  PHYSICIAN ASSISTANT:  Nicholes Rough, PA-C   ANESTHESIA:   general  EBL:  600 mL   BLOOD ADMINISTERED:none  DRAINS: routine   LOCAL MEDICATIONS USED:  NONE  SPECIMEN:  No Specimen  DISPOSITION OF SPECIMEN:  N/A  COUNTS:  YES  DICTATION: .Dragon Dictation  PLAN OF CARE: Admit to inpatient   PATIENT DISPOSITION:  ICU - intubated and hemodynamically stable.   Delay start of Pharmacological VTE agent (>24hrs) due to surgical blood loss or risk of bleeding: yes

## 2018-11-21 NOTE — Plan of Care (Signed)
Patient alert, oriented, complaints of pain rating 10/10, medication given. Patient encouraged to use ISP and achieved 750.  Explained that he would be dangled on the side of the bed, tonight and in the morning to allow his chest tubes to drain .  Encouraged patient to take pain medication to reduce pain, to allow for better ventilation.  Patient reluctant to take pain meds, but stated that he would tonight.  Will continue to monitor as per protocol.

## 2018-11-21 NOTE — Anesthesia Procedure Notes (Signed)
Procedure Name: Intubation Date/Time: 11/21/2018 8:50 AM Performed by: Marsa Aris, CRNA Pre-anesthesia Checklist: Patient identified, Emergency Drugs available, Suction available and Patient being monitored Patient Re-evaluated:Patient Re-evaluated prior to induction Oxygen Delivery Method: Circle System Utilized Preoxygenation: Pre-oxygenation with 100% oxygen Induction Type: IV induction Ventilation: Mask ventilation without difficulty Laryngoscope Size: Miller and 2 Grade View: Grade III Tube type: Oral Tube size: 8.0 mm Number of attempts: 2 Airway Equipment and Method: Stylet and Oral airway Placement Confirmation: ETT inserted through vocal cords under direct vision,  positive ETCO2 and breath sounds checked- equal and bilateral Secured at: 22 cm Tube secured with: Tape Dental Injury: Teeth and Oropharynx as per pre-operative assessment  Difficulty Due To: Difficulty was anticipated and Difficult Airway- due to anterior larynx Comments: Sabra Heck 2 attempted with anterior view, Mac 4 attempted by Dr. Gifford Shave; easy Glidescope intubation with Glidescope 4 blade, grade 1 view

## 2018-11-21 NOTE — Anesthesia Procedure Notes (Signed)
Central Venous Catheter Insertion Performed by: Roberts Gaudy, MD, anesthesiologist Start/End6/03/2019 7:40 AM, 11/21/2018 7:50 AM Patient location: Pre-op. Preanesthetic checklist: patient identified, IV checked, site marked, risks and benefits discussed, surgical consent, monitors and equipment checked, pre-op evaluation, timeout performed and anesthesia consent Hand hygiene performed  and maximum sterile barriers used  PA cath was placed.Swan type:thermodilution Procedure performed without using ultrasound guided technique. Attempts: 1 Following insertion, line sutured, dressing applied and Biopatch. Post procedure assessment: blood return through all ports and free fluid flow  Patient tolerated the procedure well with no immediate complications.

## 2018-11-21 NOTE — Progress Notes (Signed)
      [address]       [place] [address]             [phone]      Intubated, sedated BP (!) 115/92   Pulse 90   Temp 98.6 F (37 C)   Resp 12   SpO2 100%   24/16 CI= 1.7  Intake/Output Summary (Last 24 hours) at 11/21/2018 1833 Last data filed at 11/21/2018 1800 Gross per 24 hour  Intake 3437.78 ml  Output 2360 ml  Net 1077.78 ml   CBG 125-140 Hct= 34  Doing well early postop Volume resuscitation for low output  Haniah Penny C. Roxan Hockey, MD [facility] [phone]

## 2018-11-21 NOTE — Anesthesia Postprocedure Evaluation (Signed)
Anesthesia Post Note  Patient: Thomas Washington  Procedure(s) Performed: CORONARY ARTERY BYPASS GRAFTING (CABG), ON PUMP, TIMES 4, USING LEFT INTERNAL MAMMARY ARTERY AND RIGHT GREATER SAPHENOUS VEIN, LIMA TO LAD, RIGHT SAPHENOUS VEIN TO OM2, RIGHT SAPHENOUS VEIN TO DISTAL CIRC, RIGHT SAPHENOUS VEIN TO ACUTE MARGINAL (N/A Chest) TRANSESOPHAGEAL ECHOCARDIOGRAM (TEE) (N/A )     Patient location during evaluation: SICU Anesthesia Type: General Level of consciousness: sedated Pain management: pain level controlled Vital Signs Assessment: post-procedure vital signs reviewed and stable Respiratory status: patient remains intubated per anesthesia plan Cardiovascular status: stable Postop Assessment: no apparent nausea or vomiting Anesthetic complications: no    Last Vitals:  Vitals:   11/21/18 1545 11/21/18 1600  BP:  116/82  Pulse: 90 89  Resp: 14 12  Temp: (!) 35.5 C (!) 35.6 C  SpO2: 99% 99%    Last Pain:  Vitals:   11/21/18 1515  TempSrc: Core  PainSc:                  Catalina Gravel

## 2018-11-21 NOTE — H&P (Signed)
[address]       [place] [address]             [phone]                    Kiptyn C Cuffee [place] Medical Record #[id number] Date of Birth: [birth year]  Referring: No ref. provider found Primary Care: Elby Showers, MD Primary Cardiologist: No primary care provider on file.  Chief Complaint:    No chief complaint on file.   History of Present Illness:    Thomas Washington 68 y.o. male is seen in the office  today for at the request of Dr. nausea.  The patient gives history of perhaps a year of vague chest discomfort, he notes more of a pressure, or the sensation of having to suck harder to get enough air.  He denies any specific chest pain.  He has had no previous cardiac history.  He has been diabetic since 2004.  Recently a cardiac calcium score study was done, the patient was referred for cardiac catheterization.  Catheterization was carried out by Dr. Irish Lack and the patient was referred to cardiac surgery.   Patient has a previous history of pericarditis in 2014, no CT scan was done at that point patient did have a echocardiogram done in 2014-report as normal but the formal report could not be located.  Prior to seeing the patient in the office we arrange for follow-up echocardiogram with suggested that the patient had a dilated ascending aorta.  Patient was unaware of this.  He does have a family history of his father who died suddenly at age 98 presumed myocardial infarction His mother died in her 26s of a stroke Patient has 1 sister who is healthy 1 daughter who is healthy Current Activity/ Functional Status:  Patient is independent with mobility/ambulation, transfers, ADL's, IADL's.   Zubrod Score: At the time of surgery this patients most appropriate activity status/level should be described as: [x]     0    Normal activity, no symptoms []     1    Restricted in physical strenuous activity but ambulatory, able to do out light work []     2     Ambulatory and capable of self care, unable to do work activities, up and about               >50 % of waking hours                              []     3    Only limited self care, in bed greater than 50% of waking hours []     4    Completely disabled, no self care, confined to bed or chair []     5    Moribund   Past Medical History:  Diagnosis Date   Anginal pain ([place])    Coronary artery disease    Diabetes mellitus without complication ([place])    History of kidney stones    Hypercholesteremia    MRSA (methicillin resistant staph aureus) culture positive     Past Surgical History:  Procedure Laterality Date   LEFT HEART CATH AND CORONARY ANGIOGRAPHY N/A 11/14/2018   Procedure: LEFT HEART CATH AND CORONARY ANGIOGRAPHY;  Surgeon: Jettie Booze, MD;  Location: [facility];  Service: Cardiovascular;  Laterality: N/A;   VASECTOMY     WISDOM  TOOTH EXTRACTION      Family History  Problem Relation Age of Onset   Heart attack Father    Stroke Mother    Healthy Sister    Healthy Daughter    Colon cancer Neg Hx      Social History   Tobacco Use  Smoking Status Never Smoker  Smokeless Tobacco Never Used    Social History   Substance and Sexual Activity  Alcohol Use Yes   Comment: 1 bottle of wine monthly + 2 beers     Allergies  Allergen Reactions   Quinolones Other (See Comments)    Patient was warned about not using Cipro and similar antibiotics. Recent studies have raised concern that fluoroquinolone antibiotics could be associated with an increased risk of aortic aneurysm Fluoroquinolones have non-antimicrobial properties that might jeopardise the integrity of the extracellular matrix of the vascular wall In a  propensity score matched cohort study in [place], there was a 66% increased rate of aortic aneurysm or dissection associated with oral fluoroquinolone use, compared wit    Current Facility-Administered Medications  Medication Dose  Route Frequency Provider Last Rate Last Dose   cefUROXime (ZINACEF) 1.5 g in sodium chloride 0.9 % 100 mL IVPB  1.5 g Intravenous To OR Grace Isaac, MD       cefUROXime (ZINACEF) 750 mg in sodium chloride 0.9 % 100 mL IVPB  750 mg Intravenous To OR Grace Isaac, MD       chlorhexidine (HIBICLENS) 4 % liquid 2 application  30 mL Topical UD Grace Isaac, MD       [START ON 11/22/2018] chlorhexidine (PERIDEX) 0.12 % solution 15 mL  15 mL Mouth/Throat Once Grace Isaac, MD       dexmedetomidine (PRECEDEX) 400 MCG/100ML (4 mcg/mL) infusion  0.1-0.7 mcg/kg/hr Intravenous To OR Grace Isaac, MD       DOPamine (INTROPIN) 800 mg in dextrose 5 % 250 mL (3.2 mg/mL) infusion  0-10 mcg/kg/min Intravenous To OR Grace Isaac, MD       EPINEPHrine (ADRENALIN) 4 mg in dextrose 5 % 250 mL (0.016 mg/mL) infusion  0-10 mcg/min Intravenous To OR Grace Isaac, MD       heparin 2,500 Units, papaverine 30 mg in electrolyte-148 (PLASMALYTE-148) 500 mL irrigation   Irrigation To OR Grace Isaac, MD       heparin 30,000 units/NS 1000 mL solution for CELLSAVER   Other To OR Grace Isaac, MD       insulin regular, human (MYXREDLIN) 100 units/ 100 mL infusion   Intravenous To OR Grace Isaac, MD       magnesium sulfate (IV Push/IM) injection 40 mEq  40 mEq Other To OR Grace Isaac, MD       milrinone (PRIMACOR) 20 MG/100 ML (0.2 mg/mL) infusion  0.3 mcg/kg/min Intravenous To OR Grace Isaac, MD       nitroGLYCERIN 50 mg in dextrose 5 % 250 mL (0.2 mg/mL) infusion  2-200 mcg/min Intravenous To OR Grace Isaac, MD       norepinephrine (LEVOPHED) 4mg  in 215mL premix infusion  0-40 mcg/min Intravenous To OR Grace Isaac, MD       phenylephrine (NEOSYNEPHRINE) 20-0.9 MG/250ML-% infusion  30-200 mcg/min Intravenous To OR Grace Isaac, MD       potassium chloride injection 80 mEq  80 mEq Other To OR Grace Isaac, MD        tranexamic acid (CYKLOKAPRON)  2,500 mg in sodium chloride 0.9 % 250 mL (10 mg/mL) infusion  1.5 mg/kg/hr Intravenous To OR Grace Isaac, MD       tranexamic acid (CYKLOKAPRON) bolus via infusion - over 30 minutes 1,312.5 mg  15 mg/kg Intravenous To OR Grace Isaac, MD       tranexamic acid (CYKLOKAPRON) pump prime solution 175 mg  2 mg/kg Intracatheter To OR Grace Isaac, MD       vancomycin (VANCOCIN) 1,500 mg in sodium chloride 0.9 % 250 mL IVPB  1,500 mg Intravenous To OR Grace Isaac, MD        Pertinent items are noted in HPI.   Review of Systems:     Cardiac Review of Systems: [Y] = yes  or   [ N ] = no   Chest Pain [ n  ]  Resting SOB [ n  ] Exertional SOB  [ y ]  Vertell Limber Thomas Washington  ]   Pedal Edema [n   ]    Palpitations Thomas Washington  ] Syncope  [ n ]   Presyncope [ n  ]   General Review of Systems: [Y] = yes [  ]=no Constitional: recent weight change [  ];  Wt loss over the last 3 months [   ] anorexia [  ]; fatigue [  ]; nausea [  ]; night sweats [  ]; fever [  ]; or chills [  ];           Eye : blurred vision [  ]; diplopia [   ]; vision changes [  ];  Amaurosis fugax[  ]; Resp: cough [  ];  wheezing[  ];  hemoptysis[  ]; shortness of breath[  ]; paroxysmal nocturnal dyspnea[  ]; dyspnea on exertion[  ]; or orthopnea[  ];  GI:  gallstones[  ], vomiting[  ];  dysphagia[  ]; melena[  ];  hematochezia [  ]; heartburn[  ];   Hx of  Colonoscopy[  ]; GU: kidney stones [  ]; hematuria[  ];   dysuria [  ];  nocturia[  ];  history of     obstruction [  ]; urinary frequency [  ]             Skin: rash, swelling[  ];, hair loss[  ];  peripheral edema[  ];  or itching[  ]; Musculosketetal: myalgias[  ];  joint swelling[  ];  joint erythema[  ];  joint pain[  ];  back pain[  ];  Heme/Lymph: bruising[  ];  bleeding[  ];  anemia[  ];  Neuro: TIA[  ];  headaches[  ];  stroke[  ];  vertigo[  ];  seizures[  ];   paresthesias[  ];  difficulty walking[  ];  Psych:depression[  ]; anxiety[   ];  Endocrine: diabetes[ y ];  thyroid dysfunction[  ];  Immunizations: Flu up to date [  ]; Pneumococcal up to date [  ];  Other:     PHYSICAL EXAMINATION: BP (!) 159/98    Pulse 66    Temp 97.8 F (36.6 C) (Oral)    Resp 18    SpO2 96%  General appearance: alert, cooperative and no distress Head: Normocephalic, without obvious abnormality, atraumatic Neck: no adenopathy, no carotid bruit, no JVD, supple, symmetrical, trachea midline and thyroid not enlarged, symmetric, no tenderness/mass/nodules Lymph nodes: Cervical, supraclavicular, and axillary nodes normal. Resp: clear to auscultation bilaterally Back: symmetric, no curvature. ROM  normal. No CVA tenderness. Cardio: regular rate and rhythm, S1, S2 normal, no murmur, click, rub or gallop GI: soft, non-tender; bowel sounds normal; no masses,  no organomegaly Extremities: extremities normal, atraumatic, no cyanosis or edema and Homans sign is negative, no sign of DVT Neurologic: Grossly normal  Diagnostic Studies & Laboratory data:     Recent Radiology Findings:   Dg Chest 2 View  Result Date: 11/20/2018 CLINICAL DATA:  Preop CABG EXAM: CHEST - 2 VIEW COMPARISON:  09/11/2012 FINDINGS: Left lung clear. Persistent right basilar density, likely scarring. Heart is normal size. No effusions or acute bony abnormality. IMPRESSION: Right basilar scarring, stable.  No active disease. Electronically Signed   By: Rolm Baptise M.D.   On: 11/20/2018 09:10   Ct Angio Chest W/cm &/or Wo Cm  Result Date: 11/19/2018 CLINICAL DATA:  68 year old male under preoperative evaluation for coronary artery disease for upcoming aortic surgery. EXAM: CT ANGIOGRAPHY CHEST WITH CONTRAST TECHNIQUE: Multidetector CT imaging of the chest was performed using the standard protocol during bolus administration of intravenous contrast. Multiplanar CT image reconstructions and MIPs were obtained to evaluate the vascular anatomy. CONTRAST:  29mL OMNIPAQUE IOHEXOL 350 MG/ML  SOLN COMPARISON:  Coronary calcium scoring examination 11/06/2018. FINDINGS: Cardiovascular: Heart size is normal. There is no significant pericardial fluid, thickening or pericardial calcification. There is aortic atherosclerosis, as well as atherosclerosis of the great vessels of the mediastinum and the coronary arteries, including calcified atherosclerotic plaque in the left main, left anterior descending, left circumflex and right coronary arteries. Mild aneurysmal dilatation of the ascending thoracic aorta (4.5 cm in diameter). No evidence of thoracic aortic dissection. The aortic arch and descending thoracic aorta are otherwise normal in caliber. Mediastinum/Nodes: No pathologically enlarged mediastinal or hilar lymph nodes. Esophagus is unremarkable in appearance. No axillary lymphadenopathy. Lungs/Pleura: Right middle lobe atelectasis and/or scarring, increased compared to the prior examination. A few other scattered areas of mild scarring are noted throughout the lung bases bilaterally. No acute consolidative airspace disease. No pleural effusions. No suspicious appearing pulmonary nodules or masses are noted. Upper Abdomen: Liver has a slightly nodular contour. In segment 5 of the liver (axial image 92 of series 5) there is a 7 mm hypervascular lesion. In addition, in segment 2 of the liver there is a far less well-defined 11 mm lesion which demonstrates some hypervascular enhancement (axial image 71 of series 5). Musculoskeletal: There are no aggressive appearing lytic or blastic lesions noted in the visualized portions of the skeleton. Review of the MIP images confirms the above findings. IMPRESSION: 1. Unchanged mild aneurysmal dilatation of the ascending thoracic aorta (4.5 cm in diameter). Ascending thoracic aortic aneurysm. Recommend semi-annual imaging followup by CTA or MRA and referral to cardiothoracic surgery if not already obtained. This recommendation follows 2010  ACCF/AHA/AATS/ACR/ASA/SCA/SCAI/SIR/STS/SVM Guidelines for the Diagnosis and Management of Patients With Thoracic Aortic Disease. Circulation. 2010; 121: A193-X902. Aortic aneurysm NOS (ICD10-I71.9). 2. Aortic atherosclerosis, in addition to left main and 3 vessel coronary artery disease. Please note that although the presence of coronary artery calcium documents the presence of coronary artery disease, the severity of this disease and any potential stenosis cannot be assessed on this non-gated CT examination. Assessment for potential risk factor modification, dietary therapy or pharmacologic therapy may be warranted, if clinically indicated. 3. Nodular contour of the liver which may suggest cirrhosis. There are 2 hypervascular lesions in the liver which are incompletely characterized on today's examination, but warrant further evaluation with follow-up nonemergent MRI of the abdomen  with and without IV gadolinium in the near future to exclude the possibility of small hepatocellular carcinoma. 4. Additional incidental findings, as above. Aortic Atherosclerosis (ICD10-I70.0). Aortic aneurysm NOS (ICD10-I71.9). Electronically Signed   By: Vinnie Langton M.D.   On: 11/19/2018 18:52   Ct Cardiac Scoring  Addendum Date: 11/07/2018   ADDENDUM REPORT: 11/07/2018 09:54 CLINICAL DATA:  Risk stratification EXAM: Coronary Calcium Score TECHNIQUE: The patient was scanned on a Siemens Somatom 64 slice scanner. Axial non-contrast 3 mm slices were carried out through the heart. The data set was analyzed on a dedicated work station and scored using the [place]. FINDINGS: Non-cardiac: See separate report from [facility]. Ascending aorta: Moderately dilated 4.5 cm see recommendations from radiology Pericardium: Normal Coronary arteries: Severe 3 vessel coronary calcification IMPRESSION: Coronary calcium score of 2219. This was 23 th percentile for age and sex matched control. Patient should have f/u stress  myovue study given how high calcium score is Note f/u CTA aorta for aneurysm in 6 months Per radiology Jenkins Rouge Electronically Signed   By: Jenkins Rouge M.D.   On: 11/07/2018 09:54   Result Date: 11/07/2018 EXAM: OVER-READ INTERPRETATION  CT CHEST The following report is an over-read performed by radiologist Dr. Rolm Baptise of [facility], [place] on 11/06/2018. This over-read does not include interpretation of cardiac or coronary anatomy or pathology. The coronary calcium score interpretation by the cardiologist is attached. COMPARISON:  Chest x-ray 09/11/2012 FINDINGS: Vascular: Aneurysmal dilatation of the ascending thoracic aorta, 4.5 cm maximally. Calcifications in the aortic root, ascending aorta and aortic arch. Descending thoracic aorta normal caliber. Heart is borderline enlarged. Mediastinum/Nodes: No adenopathy in the lower mediastinum or hila. Lungs/Pleura: Scarring in the lung bases.  No effusions Upper Abdomen: Imaging into the upper abdomen shows no acute findings. Musculoskeletal: Chest wall soft tissues are unremarkable. No acute bony abnormality. IMPRESSION: Aortic atherosclerosis. 4.5 cm ascending thoracic aortic aneurysm. Recommend semi-annual imaging followup by CTA or MRA and referral to cardiothoracic surgery if not already obtained. This recommendation follows 2010 ACCF/AHA/AATS/ACR/ASA/SCA/SCAI/SIR/STS/SVM Guidelines for the Diagnosis and Management of Patients With Thoracic Aortic Disease. Circulation. 2010; 121: H607-P710. Aortic aneurysm NOS (ICD10-I71.9) Electronically Signed: By: Rolm Baptise M.D. On: 11/06/2018 11:57   Ct Angio Chest Aorta W &/or Wo Contrast  Result Date: 11/19/2018 CLINICAL DATA:  68 year old male under preoperative evaluation for coronary artery disease for upcoming aortic surgery. EXAM: CT ANGIOGRAPHY CHEST WITH CONTRAST TECHNIQUE: Multidetector CT imaging of the chest was performed using the standard protocol during bolus administration of intravenous  contrast. Multiplanar CT image reconstructions and MIPs were obtained to evaluate the vascular anatomy. CONTRAST:  69mL OMNIPAQUE IOHEXOL 350 MG/ML SOLN COMPARISON:  Coronary calcium scoring examination 11/06/2018. FINDINGS: Cardiovascular: Heart size is normal. There is no significant pericardial fluid, thickening or pericardial calcification. There is aortic atherosclerosis, as well as atherosclerosis of the great vessels of the mediastinum and the coronary arteries, including calcified atherosclerotic plaque in the left main, left anterior descending, left circumflex and right coronary arteries. Mild aneurysmal dilatation of the ascending thoracic aorta (4.5 cm in diameter). No evidence of thoracic aortic dissection. The aortic arch and descending thoracic aorta are otherwise normal in caliber. Mediastinum/Nodes: No pathologically enlarged mediastinal or hilar lymph nodes. Esophagus is unremarkable in appearance. No axillary lymphadenopathy. Lungs/Pleura: Right middle lobe atelectasis and/or scarring, increased compared to the prior examination. A few other scattered areas of mild scarring are noted throughout the lung bases bilaterally. No acute consolidative airspace disease. No pleural effusions.  No suspicious appearing pulmonary nodules or masses are noted. Upper Abdomen: Liver has a slightly nodular contour. In segment 5 of the liver (axial image 92 of series 5) there is a 7 mm hypervascular lesion. In addition, in segment 2 of the liver there is a far less well-defined 11 mm lesion which demonstrates some hypervascular enhancement (axial image 71 of series 5). Musculoskeletal: There are no aggressive appearing lytic or blastic lesions noted in the visualized portions of the skeleton. Review of the MIP images confirms the above findings. IMPRESSION: 1. Unchanged mild aneurysmal dilatation of the ascending thoracic aorta (4.5 cm in diameter). Ascending thoracic aortic aneurysm. Recommend semi-annual imaging  followup by CTA or MRA and referral to cardiothoracic surgery if not already obtained. This recommendation follows 2010 ACCF/AHA/AATS/ACR/ASA/SCA/SCAI/SIR/STS/SVM Guidelines for the Diagnosis and Management of Patients With Thoracic Aortic Disease. Circulation. 2010; 121: N989-Q119. Aortic aneurysm NOS (ICD10-I71.9). 2. Aortic atherosclerosis, in addition to left main and 3 vessel coronary artery disease. Please note that although the presence of coronary artery calcium documents the presence of coronary artery disease, the severity of this disease and any potential stenosis cannot be assessed on this non-gated CT examination. Assessment for potential risk factor modification, dietary therapy or pharmacologic therapy may be warranted, if clinically indicated. 3. Nodular contour of the liver which may suggest cirrhosis. There are 2 hypervascular lesions in the liver which are incompletely characterized on today's examination, but warrant further evaluation with follow-up nonemergent MRI of the abdomen with and without IV gadolinium in the near future to exclude the possibility of small hepatocellular carcinoma. 4. Additional incidental findings, as above. Aortic Atherosclerosis (ICD10-I70.0). Aortic aneurysm NOS (ICD10-I71.9). Electronically Signed   By: Vinnie Langton M.D.   On: 11/19/2018 18:52   Vas US Doppler Pre Cabg  Result Date: 11/20/2018 PREOPERATIVE VASCULAR EVALUATION  Indications:      Pre-CABG. Risk Factors:     Hypertension, coronary artery disease. Limitations:      Vessel tortuosity, respiratory disturbance Comparison Study: No prior studies. Performing Technologist: Carlos Levering Rvt  Examination Guidelines: A complete evaluation includes B-mode imaging, spectral Doppler, color Doppler, and power Doppler as needed of all accessible portions of each vessel. Bilateral testing is considered an integral part of a complete examination. Limited examinations for reoccurring indications may be performed  as noted.  Right Carotid Findings: +----------+--------+--------+--------+-----------------------+--------+             PSV cm/s EDV cm/s Stenosis Describe                Comments  +----------+--------+--------+--------+-----------------------+--------+  CCA Prox   81       13                smooth and heterogenous tortuous  +----------+--------+--------+--------+-----------------------+--------+  CCA Distal 74       19                smooth and heterogenous           +----------+--------+--------+--------+-----------------------+--------+  ICA Prox   64       16                                        tortuous  +----------+--------+--------+--------+-----------------------+--------+  ICA Distal 67       26  tortuous  +----------+--------+--------+--------+-----------------------+--------+  ECA        111      19                                                  +----------+--------+--------+--------+-----------------------+--------+ Portions of this table do not appear on this page. +----------+--------+-------+--------+------------+             PSV cm/s EDV cms Describe Arm Pressure  +----------+--------+-------+--------+------------+  Subclavian 142                       138           +----------+--------+-------+--------+------------+ +---------+--------+--+--------+--+---------+  Vertebral PSV cm/s 37 EDV cm/s 13 Antegrade  +---------+--------+--+--------+--+---------+ Left Carotid Findings: +----------+--------+--------+--------+-----------------------+--------+             PSV cm/s EDV cm/s Stenosis Describe                Comments  +----------+--------+--------+--------+-----------------------+--------+  CCA Prox   94       19                smooth and heterogenous tortuous  +----------+--------+--------+--------+-----------------------+--------+  CCA Distal 72       16                smooth and heterogenous            +----------+--------+--------+--------+-----------------------+--------+  ICA Prox   46       13                smooth and heterogenous tortuous  +----------+--------+--------+--------+-----------------------+--------+  ICA Distal 56       16                                        tortuous  +----------+--------+--------+--------+-----------------------+--------+  ECA        88       13                                                  +----------+--------+--------+--------+-----------------------+--------+ +----------+--------+--------+--------+------------+  Subclavian PSV cm/s EDV cm/s Describe Arm Pressure  +----------+--------+--------+--------+------------+             109                        134           +----------+--------+--------+--------+------------+ +---------+--------+--+--------+--+---------+  Vertebral PSV cm/s 30 EDV cm/s 13 Antegrade  +---------+--------+--+--------+--+---------+  ABI Findings: +--------+------------------+-----+---------+--------+  Right    Rt Pressure (mmHg) Index Waveform  Comment   +--------+------------------+-----+---------+--------+  Brachial 138                      triphasic           +--------+------------------+-----+---------+--------+  PTA      160                1.16  triphasic           +--------+------------------+-----+---------+--------+  DP       139  1.01  triphasic           +--------+------------------+-----+---------+--------+ +--------+------------------+-----+---------+-------+  Left     Lt Pressure (mmHg) Index Waveform  Comment  +--------+------------------+-----+---------+-------+  Brachial 134                      triphasic          +--------+------------------+-----+---------+-------+  PTA      167                1.21  triphasic          +--------+------------------+-----+---------+-------+  DP       150                1.09  triphasic          +--------+------------------+-----+---------+-------+  +-------+---------------+----------------+  ABI/TBI Today's ABI/TBI Previous ABI/TBI  +-------+---------------+----------------+  Right   1.16                              +-------+---------------+----------------+  Left    1.21                              +-------+---------------+----------------+  Right Doppler Findings: +-----------+--------+-----+---------+-----------------------------------------+  Site        Pressure Index Doppler   Comments                                   +-----------+--------+-----+---------+-----------------------------------------+  Brachial    138            triphasic                                            +-----------+--------+-----+---------+-----------------------------------------+  Radial                     triphasic                                            +-----------+--------+-----+---------+-----------------------------------------+  Ulnar                      triphasic                                            +-----------+--------+-----+---------+-----------------------------------------+  Palmar Arch                          Palmar waveforms are obliterated with                                            radial and ulnar compression.              +-----------+--------+-----+---------+-----------------------------------------+  Left Doppler Findings: +-----------+--------+-----+---------+-----------------------------------------+  Site        Pressure Index Doppler   Comments                                   +-----------+--------+-----+---------+-----------------------------------------+  Brachial    134            triphasic                                            +-----------+--------+-----+---------+-----------------------------------------+  Radial                     triphasic                                            +-----------+--------+-----+---------+-----------------------------------------+  Ulnar                      triphasic                                             +-----------+--------+-----+---------+-----------------------------------------+  Palmar Arch                          Palmar waveforms are obliterated with                                            radial and ulnar compression.              +-----------+--------+-----+---------+-----------------------------------------+  Summary: Right Carotid: Velocities in the right ICA are consistent with a 1-39% stenosis. Left Carotid: Velocities in the left ICA are consistent with a 1-39% stenosis. Vertebrals: Bilateral vertebral arteries demonstrate antegrade flow. Right ABI: Resting right ankle-brachial index is within normal range. No evidence of significant right lower extremity arterial disease. Left ABI: Resting left ankle-brachial index is within normal range. No evidence of significant left lower extremity arterial disease.   Electronically signed by Harold Barban MD on 11/20/2018 at 12:44:52 PM.    Final      I have independently reviewed the above radiology studies  and reviewed the findings with the patient.    CATH:11/14/2018 Prox RCA to Mid RCA lesion is 80% stenosed.  LPDA lesion is 75% stenosed.  Ost 2nd Mrg to 2nd Mrg lesion is 100% stenosed.  Mid LAD lesion is 100% stenosed.  Prox LAD to Mid LAD lesion is 75% stenosed.  Prox LAD lesion is 50% stenosed.  Dist LM to Ost LAD lesion is 50% stenosed.  The left ventricular systolic function is normal.  LV end diastolic pressure is normal.  The left ventricular ejection fraction is 55-65% by visual estimate.  There is no aortic valve stenosis.   Multivessel CAD with CTO of LAD and CTO of OM.    Plan for surgical consult.  Continue aggressive medical therapy.   ECHO: 11/16/2018 ECHOCARDIOGRAM REPORT       Patient Name:   Thomas Washington Date of Exam: 11/16/2018 Medical Rec #:  [id number]       Height:       69.0 in Accession #:    [id number]      Weight:       184.0 lb Date of Birth:  1950/08/21       BSA:  1.99 m Patient Age:    25 years        BP:           145/93 mmHg Patient Gender: M               HR:           55 bpm. Exam Location:  Outpatient    Procedure: 2D Echo, Cardiac Doppler, Color Doppler and Strain Analysis  Indications:    CAD Native Vessel 414.01/ 125.10   History:        Patient has prior history of Echocardiogram examinations, most                 recent 09/13/2012. Acute pericarditis CAD Risk Factors:                 Hypertension, Dyslipidemia and Non-Smoker.   Sonographer:    Paulita Fujita RDCS Referring Phys: Grace Isaac  IMPRESSIONS    1. The left ventricle has normal systolic function with an ejection fraction of 60-65%. The cavity size was normal. Left ventricular diastolic Doppler parameters are consistent with impaired relaxation.  2. The right ventricle has normal systolic function. The cavity was normal. There is no increase in right ventricular wall thickness.  3. There is dilatation of the aortic root measuring 40 mm.  4. There is no evidence of pericardial effusion.  FINDINGS  Left Ventricle: The left ventricle has normal systolic function, with an ejection fraction of 60-65%. The cavity size was normal. There is no increase in left ventricular wall thickness. Left ventricular diastolic Doppler parameters are consistent with  impaired relaxation.  Right Ventricle: The right ventricle has normal systolic function. The cavity was normal. There is no increase in right ventricular wall thickness.  Left Atrium: Left atrial size was normal in size.  Right Atrium: Right atrial size was normal in size. Right atrial pressure is estimated at 3 mmHg.  Interatrial Septum: No atrial level shunt detected by color flow Doppler.  Pericardium: There is no evidence of pericardial effusion.  Mitral Valve: The mitral valve is normal in structure. Mitral valve regurgitation is mild by color flow Doppler.  Tricuspid Valve: The tricuspid valve is  normal in structure. Tricuspid valve regurgitation was not visualized by color flow Doppler.  Aortic Valve: The aortic valve is normal in structure. Aortic valve regurgitation was not visualized by color flow Doppler.  Pulmonic Valve: The pulmonic valve was normal in structure. Pulmonic valve regurgitation is trivial by color flow Doppler.  Aorta: There is dilatation of the aortic root measuring 40 mm.  Venous: The inferior vena cava is normal in size with greater than 50% respiratory variability.    +--------------+--------++  LEFT VENTRICLE                 +----------------+---------++ +--------------+--------++       Diastology                    PLAX 2D                        +----------------+---------++ +--------------+--------++       LV e' lateral:   6.96 cm/s    LVIDd:         4.30 cm         +----------------+---------++ +--------------+--------++       LV E/e' lateral: 9.8          LVIDs:  3.10 cm         +----------------+---------++ +--------------+--------++       LV e' medial:    5.33 cm/s    LV PW:         1.00 cm         +----------------+---------++ +--------------+--------++       LV E/e' medial:  12.8         LV IVS:        1.00 cm         +----------------+---------++ +--------------+--------++  LVOT diam:     1.90 cm         +----------------------+-------++ +--------------+--------++       2D Longitudinal Strain            LV SV:         45 ml           +----------------------+-------++ +--------------+--------++       2D Strain GLS Avg:     -20.5 %    LV SV Index:   22.23           +----------------------+-------++ +--------------+--------++  LVOT Area:     2.84 cm   +--------------+--------++                            +--------------+--------++   +------------------+---------++  LV Volumes (MOD)               +------------------+---------++  LV area d, A2C:    31.00 cm   +------------------+---------++  LV area d, A4C:    29.30  cm   +------------------+---------++  LV area s, A2C:    18.10 cm   +------------------+---------++  LV area s, A4C:    17.00 cm   +------------------+---------++  LV major d, A2C:   7.94 cm     +------------------+---------++  LV major d, A4C:   8.15 cm     +------------------+---------++  LV major s, A2C:   6.67 cm     +------------------+---------++  LV major s, A4C:   6.34 cm     +------------------+---------++  LV vol d, MOD A2C: 100.0 ml    +------------------+---------++  LV vol d, MOD A4C: 86.2 ml     +------------------+---------++  LV vol s, MOD A2C: 41.3 ml     +------------------+---------++  LV vol s, MOD A4C: 38.9 ml     +------------------+---------++  LV SV MOD A2C:     58.7 ml     +------------------+---------++  LV SV MOD A4C:     86.2 ml     +------------------+---------++  LV SV MOD BP:      53.0 ml     +------------------+---------++  +---------------+----------++  RIGHT VENTRICLE              +---------------+----------++  RV S prime:     12.80 cm/s   +---------------+----------++  TAPSE (M-mode): 1.9 cm       +---------------+----------++  +---------------+-------++-----------++  LEFT ATRIUM              Index         +---------------+-------++-----------++  LA diam:        3.90 cm  1.96 cm/m    +---------------+-------++-----------++  LA Vol (A2C):   44.7 ml  22.42 ml/m   +---------------+-------++-----------++  LA Vol (A4C):   31.2 ml  15.65 ml/m   +---------------+-------++-----------++  LA Biplane Vol: 38.3 ml  19.21 ml/m   +---------------+-------++-----------++ +------------+---------++-----------++  RIGHT ATRIUM  Index         +------------+---------++-----------++  RA Area:     12.30 cm                +------------+---------++-----------++  RA Volume:   21.80 ml   10.93 ml/m   +------------+---------++-----------++  +------------+-----------++  AORTIC VALVE               +------------+-----------++  LVOT  Vmax:   109.00 cm/s   +------------+-----------++  LVOT Vmean:  77.200 cm/s   +------------+-----------++  LVOT VTI:    0.265 m       +------------+-----------++   +-------------+-------++  AORTA                   +-------------+-------++  Ao Root diam: 3.10 cm   +-------------+-------++  +--------------+----------++  MITRAL VALVE                +--------------+-------+ +--------------+----------++  SHUNTS                   MV Area (PHT): 3.27 cm     +--------------+-------+ +--------------+----------++  Systemic VTI:  0.26 m    MV PHT:        67.28 msec   +--------------+-------+ +--------------+----------++  Systemic Diam: 1.90 cm   MV Decel Time: 232 msec     +--------------+-------+ +--------------+----------++ +--------------+----------++  MV E velocity: 68.10 cm/s   +--------------+----------++  MV A velocity: 75.00 cm/s   +--------------+----------++  MV E/A ratio:  0.91         +--------------+----------++    Candee Furbish MD Electronically signed by Candee Furbish MD Signature Date/Time: 11/16/2018/1:53:03 PM      Recent Lab Findings: Lab Results  Component Value Date   WBC 8.6 11/20/2018   HGB 15.4 11/20/2018   HCT 46.0 11/20/2018   PLT 199 11/20/2018   GLUCOSE 183 (H) 11/20/2018   CHOL 158 07/13/2018   TRIG 83 07/13/2018   HDL 51 07/13/2018   LDLCALC 90 07/13/2018   ALT 26 11/20/2018   AST 22 11/20/2018   NA 137 11/20/2018   K 3.7 11/20/2018   CL 108 11/20/2018   CREATININE 1.01 11/20/2018   BUN 12 11/20/2018   CO2 18 (L) 11/20/2018   TSH 3.350 10/31/2014   INR 1.1 11/20/2018   HGBA1C 6.5 (H) 11/20/2018      Assessment / Plan:   #1 coronary occlusive disease three-vessel and diabetic male, with exertionally related symptoms primarily shortness of breath with exertion. #2 dilated ascending aorta though incompletely characterized by noncontrasted limited CT for calcium scoring purposes and echocardiogram-  #3 diabetes mellitus for 16  years-controlled on oral agents #4 hypervascular lesions in the liver which are incompletely characterized on CTA ,  warrant further evaluation with follow-up nonemergent MRI of the abdomen with and without IV gadolinium in the near future to exclude the possibility of small hepatocellular carcinoma  I reviewed the patient's history symptoms cardiac catheterization and echocardiogram.  With the patient's symptoms and severe three-vessel disease specially with 2 of the 3 major vessels occluded I recommended to the patient that we proceed with coronary artery bypass graft. CTA of chest reviewed with tri leaflet valve and aortic root measurement less the 4 cm and ascending aorta just less then 4.5 may not replace ascending aorta, discussed with patient .  The goals risks and alternatives of the planned surgical procedure Procedure(s): CORONARY ARTERY BYPASS GRAFTING (CABG) (N/A) POSSIBLE REPLACEMENT ASCENDING AORTA (N/A) TRANSESOPHAGEAL ECHOCARDIOGRAM (TEE) (N/A)  have been  discussed with the patient in detail. The risks of the procedure including death, infection, stroke, myocardial infarction, bleeding, blood transfusion have all been discussed specifically.  I have quoted [name] a 2 % of perioperative mortality and a complication rate as high as 30 %. The patient's questions have been answered.[name] is willing  to proceed with the planned procedure.    Grace Isaac MD      [address] [place] [address] Office [phone]   Beeper [phone]  11/21/2018 7:12 AM

## 2018-11-21 NOTE — Procedures (Signed)
Extubation Procedure Note  Patient Details:   Name: Thomas Washington DOB: 11/02/1950 MRN: [id number]   Airway Documentation:    Vent end date: (not recorded) Vent end time: (not recorded)   Evaluation  O2 sats: stable throughout Complications: No apparent complications Patient did tolerate procedure well. Bilateral Breath Sounds: Clear, Diminished   Yes   Patient extubated to [facility] with no complications per wean Protocol. NIF and Vital within normal range, IS-500  Levora Dredge 11/21/2018, 7:05 PM

## 2018-11-22 ENCOUNTER — Inpatient Hospital Stay (HOSPITAL_COMMUNITY): Payer: Medicare HMO

## 2018-11-22 ENCOUNTER — Encounter (HOSPITAL_COMMUNITY): Payer: Self-pay | Admitting: Cardiothoracic Surgery

## 2018-11-22 LAB — POCT I-STAT 7, (LYTES, BLD GAS, ICA,H+H)
Acid-base deficit: 4 mmol/L — ABNORMAL HIGH (ref 0.0–2.0)
Bicarbonate: 22.1 mmol/L (ref 20.0–28.0)
Calcium, Ion: 1.13 mmol/L — ABNORMAL LOW (ref 1.15–1.40)
HCT: 37 % — ABNORMAL LOW (ref 39.0–52.0)
Hemoglobin: 12.6 g/dL — ABNORMAL LOW (ref 13.0–17.0)
O2 Saturation: 97 %
Potassium: 4.3 mmol/L (ref 3.5–5.1)
Sodium: 142 mmol/L (ref 135–145)
TCO2: 23 mmol/L (ref 22–32)
pCO2 arterial: 44.2 mmHg (ref 32.0–48.0)
pH, Arterial: 7.308 — ABNORMAL LOW (ref 7.350–7.450)
pO2, Arterial: 99 mmHg (ref 83.0–108.0)

## 2018-11-22 LAB — BASIC METABOLIC PANEL
Anion gap: 9 (ref 5–15)
Anion gap: 9 (ref 5–15)
BUN: 11 mg/dL (ref 8–23)
BUN: 20 mg/dL (ref 8–23)
CO2: 21 mmol/L — ABNORMAL LOW (ref 22–32)
CO2: 23 mmol/L (ref 22–32)
Calcium: 7.9 mg/dL — ABNORMAL LOW (ref 8.9–10.3)
Calcium: 8.6 mg/dL — ABNORMAL LOW (ref 8.9–10.3)
Chloride: 108 mmol/L (ref 98–111)
Chloride: 105 mmol/L (ref 98–111)
Creatinine, Ser: 0.87 mg/dL (ref 0.61–1.24)
Creatinine, Ser: 1.2 mg/dL (ref 0.61–1.24)
GFR calc Af Amer: >60 mL/min (ref >60)
GFR calc Af Amer: >60 mL/min (ref >60)
GFR calc non Af Amer: >60 mL/min (ref >60)
GFR calc non Af Amer: >60 mL/min (ref >60)
Glucose, Bld: 126 mg/dL — ABNORMAL HIGH (ref 70–99)
Glucose, Bld: 147 mg/dL — ABNORMAL HIGH (ref 70–99)
Potassium: 4.2 mmol/L (ref 3.5–5.1)
Potassium: 4.2 mmol/L (ref 3.5–5.1)
Sodium: 138 mmol/L (ref 135–145)
Sodium: 137 mmol/L (ref 135–145)

## 2018-11-22 LAB — GLUCOSE, CAPILLARY
Glucose-Capillary: 104 mg/dL — ABNORMAL HIGH (ref 70–99)
Glucose-Capillary: 104 mg/dL — ABNORMAL HIGH (ref 70–99)
Glucose-Capillary: 115 mg/dL — ABNORMAL HIGH (ref 70–99)
Glucose-Capillary: 120 mg/dL — ABNORMAL HIGH (ref 70–99)
Glucose-Capillary: 120 mg/dL — ABNORMAL HIGH (ref 70–99)
Glucose-Capillary: 121 mg/dL — ABNORMAL HIGH (ref 70–99)
Glucose-Capillary: 124 mg/dL — ABNORMAL HIGH (ref 70–99)
Glucose-Capillary: 126 mg/dL — ABNORMAL HIGH (ref 70–99)
Glucose-Capillary: 88 mg/dL (ref 70–99)
Glucose-Capillary: 95 mg/dL (ref 70–99)

## 2018-11-22 LAB — CBC
HCT: 35.8 % — ABNORMAL LOW (ref 39.0–52.0)
HCT: 37.3 % — ABNORMAL LOW (ref 39.0–52.0)
Hemoglobin: 11.9 g/dL — ABNORMAL LOW (ref 13.0–17.0)
Hemoglobin: 12.1 g/dL — ABNORMAL LOW (ref 13.0–17.0)
MCH: 29.5 pg (ref 26.0–34.0)
MCH: 29.2 pg (ref 26.0–34.0)
MCHC: 33.2 g/dL (ref 30.0–36.0)
MCHC: 32.4 g/dL (ref 30.0–36.0)
MCV: 88.6 fL (ref 80.0–100.0)
MCV: 89.9 fL (ref 80.0–100.0)
Platelets: 144 10*3/uL — ABNORMAL LOW (ref 150–400)
Platelets: 154 10*3/uL (ref 150–400)
RBC: 4.04 MIL/uL — ABNORMAL LOW (ref 4.22–5.81)
RBC: 4.15 MIL/uL — ABNORMAL LOW (ref 4.22–5.81)
RDW: 13.7 % (ref 11.5–15.5)
RDW: 14 % (ref 11.5–15.5)
WBC: 14.4 10*3/uL — ABNORMAL HIGH (ref 4.0–10.5)
WBC: 15.9 10*3/uL — ABNORMAL HIGH (ref 4.0–10.5)
nRBC: 0 % (ref 0.0–0.2)
nRBC: 0 % (ref 0.0–0.2)

## 2018-11-22 LAB — COOXEMETRY PANEL
Carboxyhemoglobin: 1 % (ref 0.5–1.5)
Methemoglobin: 1 % (ref 0.0–1.5)
O2 Saturation: 75.1 %
Total hemoglobin: 12.3 g/dL (ref 12.0–16.0)

## 2018-11-22 LAB — POCT I-STAT 4, (NA,K, GLUC, HGB,HCT)
Glucose, Bld: 131 mg/dL — ABNORMAL HIGH (ref 70–99)
HCT: 33 % — ABNORMAL LOW (ref 39.0–52.0)
Hemoglobin: 11.2 g/dL — ABNORMAL LOW (ref 13.0–17.0)
Potassium: 3.2 mmol/L — ABNORMAL LOW (ref 3.5–5.1)
Sodium: 145 mmol/L (ref 135–145)

## 2018-11-22 LAB — MAGNESIUM
Magnesium: 2.6 mg/dL — ABNORMAL HIGH (ref 1.7–2.4)
Magnesium: 2.6 mg/dL — ABNORMAL HIGH (ref 1.7–2.4)

## 2018-11-22 MED ORDER — KETOROLAC TROMETHAMINE 15 MG/ML IJ SOLN
15.0000 mg | Freq: Once | INTRAMUSCULAR | Status: AC
  Administered 2018-11-22: 15 mg via INTRAVENOUS
  Filled 2018-11-22: qty 1

## 2018-11-22 MED ORDER — INSULIN ASPART 100 UNIT/ML ~~LOC~~ SOLN
0.0000 [IU] | SUBCUTANEOUS | Status: DC
  Administered 2018-11-22 – 2018-11-24 (×7): 2 [IU] via SUBCUTANEOUS

## 2018-11-22 MED ORDER — INSULIN DETEMIR 100 UNIT/ML ~~LOC~~ SOLN: 10.0000 [IU] | Freq: Once | SUBCUTANEOUS | Status: DC

## 2018-11-22 MED ORDER — CHLORHEXIDINE GLUCONATE 0.12 % MT SOLN
15.0000 mL | Freq: Two times a day (BID) | OROMUCOSAL | Status: DC
  Administered 2018-11-22: 15 mL via OROMUCOSAL
  Filled 2018-11-22: qty 15

## 2018-11-22 MED ORDER — INSULIN DETEMIR 100 UNIT/ML ~~LOC~~ SOLN
10.0000 [IU] | Freq: Every day | SUBCUTANEOUS | Status: DC
  Filled 2018-11-22: qty 0.1

## 2018-11-22 MED ORDER — INSULIN DETEMIR 100 UNIT/ML ~~LOC~~ SOLN
10.0000 [IU] | Freq: Every day | SUBCUTANEOUS | Status: DC
  Administered 2018-11-23 – 2018-11-24 (×2): 10 [IU] via SUBCUTANEOUS
  Filled 2018-11-22 (×2): qty 0.1

## 2018-11-22 MED ORDER — INSULIN ASPART 100 UNIT/ML ~~LOC~~ SOLN: 0.0000 [IU] | SUBCUTANEOUS | Status: DC

## 2018-11-22 MED ORDER — INSULIN DETEMIR 100 UNIT/ML ~~LOC~~ SOLN: 10.0000 [IU] | Freq: Every day | SUBCUTANEOUS | Status: DC

## 2018-11-22 MED ORDER — INSULIN ASPART 100 UNIT/ML ~~LOC~~ SOLN: 0.0000 [IU] | Freq: Three times a day (TID) | SUBCUTANEOUS | Status: DC

## 2018-11-22 MED ORDER — ORAL CARE MOUTH RINSE: 15.0000 mL | Freq: Two times a day (BID) | OROMUCOSAL | Status: DC

## 2018-11-22 MED ORDER — ENOXAPARIN SODIUM 40 MG/0.4ML ~~LOC~~ SOLN
40.0000 mg | Freq: Every day | SUBCUTANEOUS | Status: DC
  Administered 2018-11-22 – 2018-11-24 (×3): 40 mg via SUBCUTANEOUS
  Filled 2018-11-22 (×3): qty 0.4

## 2018-11-22 MED ORDER — CHLORHEXIDINE GLUCONATE CLOTH 2 % EX PADS
6.0000 | MEDICATED_PAD | Freq: Every day | CUTANEOUS | Status: DC
  Administered 2018-11-23 – 2018-11-24 (×2): 6 via TOPICAL

## 2018-11-22 MED FILL — Potassium Chloride Inj 2 mEq/ML: INTRAVENOUS | Qty: 40 | Status: AC

## 2018-11-22 MED FILL — Heparin Sodium (Porcine) Inj 1000 Unit/ML: INTRAMUSCULAR | Qty: 30 | Status: AC

## 2018-11-22 MED FILL — Magnesium Sulfate Inj 50%: INTRAMUSCULAR | Qty: 10 | Status: AC

## 2018-11-22 NOTE — Progress Notes (Addendum)
[facility] DAILY ICU PROGRESS NOTE                   [address]            [place] [address]          [phone]   1 Day Post-Op Procedure(s) (LRB): CORONARY ARTERY BYPASS GRAFTING (CABG), ON PUMP, TIMES 4, USING LEFT INTERNAL MAMMARY ARTERY AND RIGHT GREATER SAPHENOUS VEIN, LIMA TO LAD, RIGHT SAPHENOUS VEIN TO OM2, RIGHT SAPHENOUS VEIN TO DISTAL CIRC, RIGHT SAPHENOUS VEIN TO ACUTE MARGINAL (N/A) TRANSESOPHAGEAL ECHOCARDIOGRAM (TEE) (N/A)  Total Length of Stay:  LOS: 1 day   Subjective: In pain this morning. Pain is over his sternal incisions and he states it is hard to breath  Objective: Vital signs in last 24 hours: Temp:  [95.5 F (35.3 C)-100.4 F (38 C)] 99 F (37.2 C) (06/11 0700) Pulse Rate:  [41-91] 64 (06/11 0700) Cardiac Rhythm: Atrial paced (06/10 1930) Resp:  [9-32] 12 (06/11 0700) BP: (78-135)/(62-92) 123/82 (06/11 0700) SpO2:  [86 %-100 %] 94 % (06/11 0700) Arterial Line BP: (95-148)/(55-75) 132/63 (06/11 0700) FiO2 (%):  [40 %-50 %] 40 % (06/10 1835) Weight:  [87.9 kg] 87.9 kg (06/11 0414)  Filed Weights   11/22/18 0414  Weight: 87.9 kg    Weight change:    Hemodynamic parameters for last 24 hours: PAP: (16-41)/(5-21) 38/19 CO:  [3.2 L/min-4.1 L/min] 4.1 L/min CI:  [1.6 L/min/m2-2 L/min/m2] 2 L/min/m2  Intake/Output from previous day: 06/10 0701 - 06/11 0700 In: 4163.8 [I.V.:2718.9; Blood:300; IV Piggyback:1144.9] Out: 0240 [Urine:2420; Blood:600; Chest Tube:310]  Intake/Output this shift: No intake/output data recorded.  Current Meds: Scheduled Meds: . acetaminophen  1,000 mg Oral Q6H   Or  . acetaminophen (TYLENOL) oral liquid 160 mg/5 mL  1,000 mg Per Tube Q6H  . aspirin EC  325 mg Oral Daily   Or  . aspirin  324 mg Per Tube Daily  . bisacodyl  10 mg Oral Daily   Or  . bisacodyl  10 mg Rectal Daily  . chlorhexidine gluconate (MEDLINE KIT)  15 mL Mouth Rinse BID  . Chlorhexidine Gluconate Cloth  6 each Topical Daily  .  Chlorhexidine Gluconate Cloth  6 each Topical Daily  . docusate sodium  200 mg Oral Daily  . insulin regular  0-10 Units Intravenous TID WC  . mouth rinse  15 mL Mouth Rinse 10 times per day  . metoCLOPramide (REGLAN) injection  10 mg Intravenous Q6H  . metoprolol tartrate  12.5 mg Oral BID   Or  . metoprolol tartrate  12.5 mg Per Tube BID  . [START ON 11/23/2018] pantoprazole  40 mg Oral Daily  . rosuvastatin  40 mg Oral Daily  . sodium chloride flush  10-40 mL Intracatheter Q12H  . sodium chloride flush  3 mL Intravenous Q12H   Continuous Infusions: . sodium chloride 10 mL/hr at 11/22/18 0500  . sodium chloride    . sodium chloride 10 mL/hr at 11/21/18 1519  . albumin human 12.5 g (11/21/18 1835)  . cefUROXime (ZINACEF)  IV 1.5 g (11/22/18 0506)  . dexmedetomidine (PRECEDEX) IV infusion Stopped (11/21/18 1737)  . insulin 0.7 mL/hr at 11/22/18 0500  . lactated ringers    . lactated ringers    . lactated ringers Stopped (11/21/18 2115)  . nitroGLYCERIN    . phenylephrine (NEO-SYNEPHRINE) Adult infusion Stopped (11/22/18 0040)   PRN Meds:.sodium chloride, albumin human, lactated ringers, metoprolol tartrate, midazolam, morphine injection,  ondansetron (ZOFRAN) IV, oxyCODONE, sodium chloride flush, sodium chloride flush, traMADol  General appearance: alert, cooperative and mild distress Heart: regular rate and rhythm, S1, S2 normal, no murmur, click, rub or gallop Lungs: clear to auscultation bilaterally and diminished at the bases Abdomen: soft, non-tender; bowel sounds normal; no masses,  no organomegaly Extremities: extremities normal, atraumatic, no cyanosis or edema Wound: clean and dry dressed with a sterile dressing  Lab Results: CBC: Recent Labs    11/21/18 2040 11/21/18 2051 11/22/18 0330  WBC 12.7*  --  14.4*  HGB 11.6* 10.9* 11.9*  HCT 34.9* 32.0* 35.8*  PLT 143*  --  144*   BMET:  Recent Labs    11/20/18 0838  11/21/18 2051 11/22/18 0330  NA 137   < > 140  138  K 3.7   < > 4.1 4.2  CL 108  --  107 108  CO2 18*  --   --  21*  GLUCOSE 183*   < > 126* 126*  BUN 12  --  11 11  CREATININE 1.01   < > 0.70 0.87  CALCIUM 9.2  --   --  7.9*   < > = values in this interval not displayed.    CMET: Lab Results  Component Value Date   WBC 14.4 (H) 11/22/2018   HGB 11.9 (L) 11/22/2018   HCT 35.8 (L) 11/22/2018   PLT 144 (L) 11/22/2018   GLUCOSE 126 (H) 11/22/2018   CHOL 158 07/13/2018   TRIG 83 07/13/2018   HDL 51 07/13/2018   LDLCALC 90 07/13/2018   ALT 26 11/20/2018   AST 22 11/20/2018   NA 138 11/22/2018   K 4.2 11/22/2018   CL 108 11/22/2018   CREATININE 0.87 11/22/2018   BUN 11 11/22/2018   CO2 21 (L) 11/22/2018   TSH 3.350 10/31/2014   PSA 1.6 07/13/2018   INR 1.6 (H) 11/21/2018   HGBA1C 6.5 (H) 11/20/2018   MICROALBUR 4.9 07/13/2018      PT/INR:  Recent Labs    11/21/18 1458  LABPROT 18.4*  INR 1.6*   Radiology: Dg Chest Port 1 View  Result Date: 11/21/2018 CLINICAL DATA:  Status post CABG EXAM: PORTABLE CHEST 1 VIEW COMPARISON:  11/20/2018 FINDINGS: Support Apparatus: --Endotracheal tube: Tip at the level of the clavicular heads. --Enteric tube:Looped in the esophagus with tip near the thoracic inlet. --Catheter(s):Right IJ approach pulmonary arterial catheter tip projects over the main --Other: Left-sided chest tube tip projects over the mid lung The heart size and mediastinal contours are within normal limits. Right basilar atelectasis, mild. No pleural effusion or pneumothorax. IMPRESSION: 1. Enteric tube looped in the thoracic esophagus with tip projecting upward at the level of the clavicular heads. 2. Otherwise radiographically appropriate appearance of support apparatus. Electronically Signed   By: Ulyses Jarred M.D.   On: 11/21/2018 15:49     Assessment/Plan: S/P Procedure(s) (LRB): CORONARY ARTERY BYPASS GRAFTING (CABG), ON PUMP, TIMES 4, USING LEFT INTERNAL MAMMARY ARTERY AND RIGHT GREATER SAPHENOUS VEIN, LIMA  TO LAD, RIGHT SAPHENOUS VEIN TO OM2, RIGHT SAPHENOUS VEIN TO DISTAL CIRC, RIGHT SAPHENOUS VEIN TO ACUTE MARGINAL (N/A) TRANSESOPHAGEAL ECHOCARDIOGRAM (TEE) (N/A)  1. CV-BP elevated this morning with MAPs in the 100s. Likely due to pain. Remains hemodynamically stable and in NSR 2. Pulm-CXR reviewed and stable. Good saturation on 3.5L [place] of oxygen. Encouraged use of incentive spirometer. Only 300cc/24 hours out of chest tubes so okay to discontinue.  3. Renal-creatinine 0.87, electrolytes okay  4.  Endo-transition to Levemir and SSI. Holding off on home oral DM meds 5. H and H- 11.9/35.8, expected acute blood los anemia 6. Continue post-op abx 7. Pain control-working on getting him more comfortable this morning. Toradol 11m IV x 1. He also has Morphine IV and Oxycodone available.   Plan: Remove chest tubes, arterial line, and swan-ganz catheter. One dose of Toradol for pain control. Work on getting patient to the chair today.    TElgie Collard6/04/2019 8:01 AM [facility]  75 o2 sats good uncomfortable this am Expected Acute  Blood - loss Anemia- continue to monitor  D/c chest tubes  I have seen and examined JMyrene Buddyand agree with the above assessment  and plan.  EGrace IsaacMD Beeper [phone] [phone]/04/2019 9:15 AM

## 2018-11-22 NOTE — Op Note (Signed)
NAME: Thomas Washington, Thomas Washington MEDICAL RECORD JO:[id number] ACCOUNT [id number] DATE OF BIRTH:11/30/1950 FACILITY: [facility] LOCATION: [facility] PHYSICIAN:Rober Skeels Maryruth Bun, MD  OPERATIVE REPORT  DATE OF PROCEDURE:  11/21/2018  PREOPERATIVE DIAGNOSIS:  Coronary occlusive disease, 3-vessel.  POSTOPERATIVE DIAGNOSIS:  Coronary occlusive disease, 3-vessel.  SURGICAL PROCEDURE:  Coronary artery bypass grafting x4 with the left internal mammary to the left anterior descending coronary artery, reverse saphenous vein graft sequentially to the second obtuse marginal and distal circumflex, reverse saphenous vein  graft to the acute marginal with right thigh and calf greater saphenous endoscopic vein harvesting.  SURGEON:  Lanelle Bal, MD  FIRST ASSISTANT:  Nicholes Rough, PA  BRIEF HISTORY:  The patient is a 68 year old male with a 17-year history of diabetes who several years previously was hospitalized for "pericarditis."  More recently, he had noted increasing symptoms of shortness of breath with exertion.  He denied any  specific chest pain, has had no definite history of myocardial infarction documented.  A cardiac calcium score was done and was elevated.  Because of this, he was referred to cardiology.  Cardiac catheterization was performed by Dr. Irish Lack which  demonstrated total occlusion of his LAD, total occlusion of the second obtuse marginal, 60% stenosis of the midportion of a large dominant circumflex system supplying collaterals to the distal LAD and 80% stenosis of a relatively small nondominant right  coronary system.  Overall, ventricular function was preserved.  The patient was referred for cardiac surgery.  Because of the suggestion of mildly dilated aorta on the cardiac calcium score, a full CTA of the chest was done and an echocardiogram before  considering coronary artery bypass grafting.  The patient's echocardiogram showed no evidence of aortic insufficiency.  The patient had a trileaflet  aortic valve:  The aortic root was very mildly dilated distal, just less than 4 cm. Ascending aorta  depending on the site of measurement on the CT and echo was between 4.2 and 4.3 cm.  Coronary artery bypass grafting was recommended to the patient who agreed and signed informed consent.  DESCRIPTION OF PROCEDURE:  With Swan-Ganz and arterial line monitors in place, the patient underwent general endotracheal anesthesia without incident.  The skin of the chest and legs were prepped with Betadine, draped in the usual sterile manner.   Appropriate timeout was performed, and we proceeded with endoscopic vein harvesting of the right greater saphenous vein from the thigh and upper calf.  The vein was of excellent quality and caliber.  Median sternotomy was performed.  The left internal  mammary artery was dissected down as a pedicle graft.  The distal artery was divided and had good free flow.  The pericardium was opened.  In spite of the patient's previous history of pericarditis, there was no evidence of pericardial adhesions.  It was  noted the ascending aorta was not significantly enlarged or thinned.  Appeared on gross external measurement at 4 cm.  The patient was systemically heparinized.  The ascending aorta was cannulated.  The right atrium was cannulated.  An aortic root vent  cardioplegia needle was introduced into the ascending aorta.  The patient was placed on cardiopulmonary bypass, 2.4 L/min per sq m.  Sites of anastomosis were selected and dissected at the epicardium.  The patient's body temperature was cooled to 32  degrees.  Aortic crossclamp was applied; 500 mL of cold blood potassium cardioplegia was administered with diastolic arrest of the heart.  Myocardial septal temperatures monitored throughout the crossclamp.  We turned our attention  first to the acute  marginal, which was a relatively small vessel, but it was opened and admitted a 1 mm probe.  The vessel was 1.2 to 1.3 mm in size.   Using a running 7-0 Prolene, distal anastomosis with a short segment of saphenous vein was performed.  Additional cold  blood cardioplegia was administered down the vein graft.  The heart was then elevated and the totally occluded second obtuse marginal vessel was identified.  This vessel was opened, admitted a 1.5 mm probe.  A diamond-type side-to-side anastomosis was  carried out with a segment of reverse saphenous vein graft.  The distal extent of the same vein was then carried to the larger distal circumflex branch.  This vessel was opened and easily admitted a 1.5 mm probe.  Using a running 7-0 Prolene, distal  anastomosis was performed.  Attention was then turned to the left anterior descending coronary artery between the mid and distal third, the vessel was opened, admitted a 1.5 mm probe proximally and distally.  Using a running 8-0 Prolene, the left  internal mammary artery was anastomosed to the left anterior descending coronary artery.  With the clamp still in place, 2 punch aortotomies were performed in the ascending aorta.  Each of the 2 vein grafts were anastomosed to the ascending aorta with  running 6-0 Prolene.  The bulldog was removed from the mammary artery with prompt rise in myocardial septal temperature.  The heart was allowed to passively fill and deair, and the proximal anastomoses were completed.  The cross-clamp was then removed  with total crossclamp time of 81 minutes.  He spontaneously converted to a sinus rhythm.  Sites of anastomosis were inspected and were free of bleeding.  Atrial and ventricular pacing wires were applied.  The patient was atrially paced at increased rate.   He was then ventilated and weaned from cardiopulmonary bypass.  He remained hemodynamically stable.  He was decannulated in the usual fashion.  Protamine sulfate was administered with the operative field hemostatic.  A left chest tube and a Blake  mediastinal drain were left in place.  The pericardium was  loosely reapproximated.  The sternum was closed with #6 stainless steel wire.  The fascia was closed with interrupted 0 Vicryl, running 3-0 Vicryl subcutaneous tissue, 3-0 subcuticular stitch in  the skin edges.  Dry dressings were applied.  Sponge and needle count was reported as correct at completion of the procedure.  The patient tolerated the procedure without obvious complication and was transferred to the surgical intensive care unit for  further postoperative care.  RF tag scanning reported clear code.  The patient did not require any blood bank blood products during the operative procedure.  Total pump time was 113 minutes.  LN/NUANCE  D:11/21/2018 T:11/22/2018 JOB:[id number]

## 2018-11-22 NOTE — Progress Notes (Signed)
CT surgery p.m. Rounds  Patient examined and record reviewed.Hemodynamics stable,labs satisfactory.patient breathing comfortably on room air.  Walked in the hallway today.  Patient had stable day.Continue current care. Thomas Washington 11/22/2018

## 2018-11-22 NOTE — Plan of Care (Signed)

## 2018-11-23 ENCOUNTER — Inpatient Hospital Stay (HOSPITAL_COMMUNITY): Payer: Medicare HMO

## 2018-11-23 LAB — CBC
HCT: 33 % — ABNORMAL LOW (ref 39.0–52.0)
Hemoglobin: 10.9 g/dL — ABNORMAL LOW (ref 13.0–17.0)
MCH: 29.3 pg (ref 26.0–34.0)
MCHC: 33 g/dL (ref 30.0–36.0)
MCV: 88.7 fL (ref 80.0–100.0)
Platelets: 122 10*3/uL — ABNORMAL LOW (ref 150–400)
RBC: 3.72 MIL/uL — ABNORMAL LOW (ref 4.22–5.81)
RDW: 13.6 % (ref 11.5–15.5)
WBC: 14.4 10*3/uL — ABNORMAL HIGH (ref 4.0–10.5)
nRBC: 0 % (ref 0.0–0.2)

## 2018-11-23 LAB — GLUCOSE, CAPILLARY
Glucose-Capillary: 119 mg/dL — ABNORMAL HIGH (ref 70–99)
Glucose-Capillary: 131 mg/dL — ABNORMAL HIGH (ref 70–99)
Glucose-Capillary: 138 mg/dL — ABNORMAL HIGH (ref 70–99)
Glucose-Capillary: 139 mg/dL — ABNORMAL HIGH (ref 70–99)
Glucose-Capillary: 143 mg/dL — ABNORMAL HIGH (ref 70–99)
Glucose-Capillary: 147 mg/dL — ABNORMAL HIGH (ref 70–99)
Glucose-Capillary: 151 mg/dL — ABNORMAL HIGH (ref 70–99)

## 2018-11-23 LAB — BASIC METABOLIC PANEL
Anion gap: 10 (ref 5–15)
BUN: 22 mg/dL (ref 8–23)
CO2: 22 mmol/L (ref 22–32)
Calcium: 8.3 mg/dL — ABNORMAL LOW (ref 8.9–10.3)
Chloride: 103 mmol/L (ref 98–111)
Creatinine, Ser: 1.11 mg/dL (ref 0.61–1.24)
GFR calc Af Amer: >60 mL/min (ref >60)
GFR calc non Af Amer: >60 mL/min (ref >60)
Glucose, Bld: 151 mg/dL — ABNORMAL HIGH (ref 70–99)
Potassium: 3.9 mmol/L (ref 3.5–5.1)
Sodium: 135 mmol/L (ref 135–145)

## 2018-11-23 MED ORDER — SODIUM CHLORIDE 0.9% FLUSH
3.0000 mL | Freq: Two times a day (BID) | INTRAVENOUS | Status: DC
  Administered 2018-11-23 – 2018-11-25 (×4): 3 mL via INTRAVENOUS

## 2018-11-23 MED ORDER — SODIUM CHLORIDE 0.9% FLUSH: 3.0000 mL | INTRAVENOUS | Status: DC | PRN

## 2018-11-23 MED ORDER — SODIUM CHLORIDE 0.9 % IV SOLN: 250.0000 mL | INTRAVENOUS | Status: DC | PRN

## 2018-11-23 MED ORDER — TAMSULOSIN HCL 0.4 MG PO CAPS
0.4000 mg | ORAL_CAPSULE | Freq: Every day | ORAL | Status: DC
  Administered 2018-11-23 – 2018-11-25 (×3): 0.4 mg via ORAL
  Filled 2018-11-23 (×3): qty 1

## 2018-11-23 MED ORDER — MOVING RIGHT ALONG BOOK
Freq: Once | Status: AC
  Administered 2018-11-23: 12:00:00
  Filled 2018-11-23: qty 1

## 2018-11-23 NOTE — Progress Notes (Signed)
Patient ID: Thomas Washington, male   DOB: [birth year], 68 y.o.   MRN: [id number] EVENING ROUNDS NOTE :     [address]       [place] [address]             [phone]                 2 Days Post-Op Procedure(s) (LRB): CORONARY ARTERY BYPASS GRAFTING (CABG), ON PUMP, TIMES 4, USING LEFT INTERNAL MAMMARY ARTERY AND RIGHT GREATER SAPHENOUS VEIN, LIMA TO LAD, RIGHT SAPHENOUS VEIN TO OM2, RIGHT SAPHENOUS VEIN TO DISTAL CIRC, RIGHT SAPHENOUS VEIN TO ACUTE MARGINAL (N/A) TRANSESOPHAGEAL ECHOCARDIOGRAM (TEE) (N/A)  Total Length of Stay:  LOS: 2 days  BP 134/86   Pulse 74   Temp 97.6 F (36.4 C)   Resp (!) 28   Wt 84.1 kg   SpO2 93%   BMI 27.38 kg/m   .Intake/Output      06/11 0701 - 06/12 0700 06/12 0701 - 06/13 0700   P.O. 720    I.V. (mL/kg) 51.6 (0.6)    Blood     Other  10   IV Piggyback 379.6    Total Intake(mL/kg) 1151.2 (13.7) 10 (0.1)   Urine (mL/kg/hr) 910 (0.5) 525 (0.6)   Blood     Chest Tube 30    Total Output 940 525   Net +211.2 -515          . sodium chloride       Lab Results  Component Value Date   WBC 14.4 (H) 11/23/2018   HGB 10.9 (L) 11/23/2018   HCT 33.0 (L) 11/23/2018   PLT 122 (L) 11/23/2018   GLUCOSE 151 (H) 11/23/2018   CHOL 158 07/13/2018   TRIG 83 07/13/2018   HDL 51 07/13/2018   LDLCALC 90 07/13/2018   ALT 26 11/20/2018   AST 22 11/20/2018   NA 135 11/23/2018   K 3.9 11/23/2018   CL 103 11/23/2018   CREATININE 1.11 11/23/2018   BUN 22 11/23/2018   CO2 22 11/23/2018   TSH 3.350 10/31/2014   PSA 1.6 07/13/2018   INR 1.6 (H) 11/21/2018   HGBA1C 6.5 (H) 11/20/2018   MICROALBUR 4.9 07/13/2018   Feels better, waiting for bed on stepdown    Grace Isaac MD  Beeper [phone] Office [phone] 11/23/2018 4:47 PM

## 2018-11-23 NOTE — Progress Notes (Addendum)
[facility] DAILY ICU PROGRESS NOTE                   [address]            [place] [address]          [phone]   2 Days Post-Op Procedure(s) (LRB): CORONARY ARTERY BYPASS GRAFTING (CABG), ON PUMP, TIMES 4, USING LEFT INTERNAL MAMMARY ARTERY AND RIGHT GREATER SAPHENOUS VEIN, LIMA TO LAD, RIGHT SAPHENOUS VEIN TO OM2, RIGHT SAPHENOUS VEIN TO DISTAL CIRC, RIGHT SAPHENOUS VEIN TO ACUTE MARGINAL (N/A) TRANSESOPHAGEAL ECHOCARDIOGRAM (TEE) (N/A)  Total Length of Stay:  LOS: 2 days   Subjective: No issues over night. He feels okay this morning but didn't get much sleep.   Objective: Vital signs in last 24 hours: Temp:  [97.7 F (36.5 C)-99.3 F (37.4 C)] 98.2 F (36.8 C) (06/12 0800) Pulse Rate:  [59-76] 73 (06/12 0800) Cardiac Rhythm: Normal sinus rhythm (06/12 0800) Resp:  [9-24] 24 (06/12 0800) BP: (104-162)/(63-94) 131/82 (06/12 0800) SpO2:  [90 %-98 %] 96 % (06/12 0800) Arterial Line BP: (139-149)/(68-70) 149/70 (06/11 1000) Weight:  [84.1 kg] 84.1 kg (06/12 0500)  Filed Weights   11/22/18 0414 11/23/18 0500  Weight: 87.9 kg 84.1 kg    Weight change: -3.8 kg   Hemodynamic parameters for last 24 hours: PAP: (40-43)/(21-24) 40/21  Intake/Output from previous day: 06/11 0701 - 06/12 0700 In: 1151.2 [P.O.:720; I.V.:51.6; IV Piggyback:379.6] Out: 940 [Urine:910; Chest Tube:30]  Intake/Output this shift: Total I/O In: 10 [Other:10] Out: -   Current Meds: Scheduled Meds: . acetaminophen  1,000 mg Oral Q6H   Or  . acetaminophen (TYLENOL) oral liquid 160 mg/5 mL  1,000 mg Per Tube Q6H  . aspirin EC  325 mg Oral Daily   Or  . aspirin  324 mg Per Tube Daily  . bisacodyl  10 mg Oral Daily   Or  . bisacodyl  10 mg Rectal Daily  . Chlorhexidine Gluconate Cloth  6 each Topical Daily  . docusate sodium  200 mg Oral Daily  . enoxaparin (LOVENOX) injection  40 mg Subcutaneous QHS  . insulin aspart  0-24 Units Subcutaneous Q4H  . insulin detemir  10 Units  Subcutaneous Daily  . metoCLOPramide (REGLAN) injection  10 mg Intravenous Q6H  . metoprolol tartrate  12.5 mg Oral BID   Or  . metoprolol tartrate  12.5 mg Per Tube BID  . pantoprazole  40 mg Oral Daily  . rosuvastatin  40 mg Oral Daily  . sodium chloride flush  10-40 mL Intracatheter Q12H  . sodium chloride flush  3 mL Intravenous Q12H   Continuous Infusions: . nitroGLYCERIN    . phenylephrine (NEO-SYNEPHRINE) Adult infusion Stopped (11/22/18 0040)   PRN Meds:.metoprolol tartrate, midazolam, morphine injection, ondansetron (ZOFRAN) IV, oxyCODONE, sodium chloride flush, sodium chloride flush, traMADol  General appearance: alert, cooperative and no distress Heart: regular rate and rhythm, S1, S2 normal, no murmur, click, rub or gallop Lungs: clear to auscultation bilaterally Abdomen: soft, non-tender; bowel sounds normal; no masses,  no organomegaly Extremities: extremities normal, atraumatic, no cyanosis or edema Wound: clean and dry dressed with a sterile dressing  Lab Results: CBC: Recent Labs    11/22/18 1704 11/23/18 0510  WBC 15.9* 14.4*  HGB 12.1* 10.9*  HCT 37.3* 33.0*  PLT 154 122*   BMET:  Recent Labs    11/22/18 1704 11/23/18 0510  NA 137 135  K 4.2 3.9  CL 105 103  CO2  23 22  GLUCOSE 147* 151*  BUN 20 22  CREATININE 1.20 1.11  CALCIUM 8.6* 8.3*    CMET: Lab Results  Component Value Date   WBC 14.4 (H) 11/23/2018   HGB 10.9 (L) 11/23/2018   HCT 33.0 (L) 11/23/2018   PLT 122 (L) 11/23/2018   GLUCOSE 151 (H) 11/23/2018   CHOL 158 07/13/2018   TRIG 83 07/13/2018   HDL 51 07/13/2018   LDLCALC 90 07/13/2018   ALT 26 11/20/2018   AST 22 11/20/2018   NA 135 11/23/2018   K 3.9 11/23/2018   CL 103 11/23/2018   CREATININE 1.11 11/23/2018   BUN 22 11/23/2018   CO2 22 11/23/2018   TSH 3.350 10/31/2014   PSA 1.6 07/13/2018   INR 1.6 (H) 11/21/2018   HGBA1C 6.5 (H) 11/20/2018   MICROALBUR 4.9 07/13/2018      PT/INR:  Recent Labs    11/21/18  1458  LABPROT 18.4*  INR 1.6*   Radiology: No results found.   Assessment/Plan: S/P Procedure(s) (LRB): CORONARY ARTERY BYPASS GRAFTING (CABG), ON PUMP, TIMES 4, USING LEFT INTERNAL MAMMARY ARTERY AND RIGHT GREATER SAPHENOUS VEIN, LIMA TO LAD, RIGHT SAPHENOUS VEIN TO OM2, RIGHT SAPHENOUS VEIN TO DISTAL CIRC, RIGHT SAPHENOUS VEIN TO ACUTE MARGINAL (N/A) TRANSESOPHAGEAL ECHOCARDIOGRAM (TEE) (N/A)   1. CV-BP better controlled. Off all drips.Remains hemodynamically stable and in NSR 2. Pulm-CXR reviewed and stable. Good saturation on room air. Encouraged use of incentive spirometer 3. Renal-creatinine 1.11, electrolytes okay. Started home flomax.  4. Endo-transition to Levemir and SSI. Holding off on home oral DM meds 5. H and H- 10.9/33.0, expected acute blood loss anemia 6. Continue post-op abx  Plan: Discontinue central line and foley catheter. Probably transfer to the stepdown unit today.    Elgie Collard 11/23/2018 8:07 AM  Better pain control this am then yesterday Plan transfer to stepdown I have seen and examined [name] and agree with the above assessment  and plan.  Grace Isaac MD Beeper [phone] Office [phone] 11/23/2018 11:08 AM

## 2018-11-23 NOTE — Discharge Summary (Signed)
Physician Discharge Summary        [address]       [place] [address]             [phone]     Patient ID: PILOT PRINDLE MRN: [id number] DOB/AGE: [birth year] 68 y.o.  Admit date: 11/21/2018 Discharge date: 11/25/2018  Admission Diagnoses: Patient Active Problem List   Diagnosis Date Noted  . Liver lesion 11/21/2018  . Angina pectoris ([place])   . CAD (coronary artery disease) 11/12/2018  . Coronary artery calcification 11/09/2018  . Diabetic peripheral neuropathy ([place]) 07/09/2017  . Family history of coronary arteriosclerosis 01/19/2014  . Acute pericarditis, unspecified 09/12/2012  . Essential hypertension 09/12/2012  . Controlled diabetes mellitus ([place]) 09/11/2012  . Hyperlipidemia 09/11/2012  . Obesity 09/11/2012    Discharge Diagnoses:  Active Problems:   Liver lesion   S/P CABG x 4   Discharged Condition: good   History of Present Illness:      Thomas Washington 68 y.o. male is seen in the office  today for at the request of Dr. nausea.  The patient gives history of perhaps a year of vague chest discomfort, he notes more of a pressure, or the sensation of having to suck harder to get enough air.  He denies any specific chest pain.  He has had no previous cardiac history.  He has been diabetic since 2004.  Recently a cardiac calcium score study was done, the patient was referred for cardiac catheterization.  Catheterization was carried out by Dr. Irish Lack and the patient was referred to cardiac surgery.   Patient has a previous history of pericarditis in 2014, no CT scan was done at that point patient did have a echocardiogram done in 2014-report as normal but the formal report could not be located.  Prior to seeing the patient in the office we arrange for follow-up echocardiogram with suggested that the patient had a dilated ascending aorta.  Patient was unaware of this.  He does have a family history of his father who died suddenly at age 75  presumed myocardial infarction His mother died in her 34s of a stroke Patient has 1 sister who is healthy 1 daughter who is healthy Current Activity/ Functional Status:  Patient is independent with mobility/ambulation, transfers, ADL's, IADL's.  Hospital Course:   On 11/21/2018 Mr. Haun underwent a coronary bypass grafting x4 with [name].  He tolerated the seizure well and was transferred to the surgical ICU.  He was extubated in a timely manner.  Postop day 1 his biggest complaint was incisional pain.  The removed his chest tubes, arterial line, and Swan-Ganz catheter.  We gave him 1 dose of Toradol in addition to his IV morphine and p.o. oxycodone for pain control.  We were holding his oral diabetes medications feel we started Levemir and sliding scale insulin.  He remained on 3.5 L of oxygen with good saturation levels.  His creatinine remained stable.  He did have some expected acute blood loss anemia.  We continued his postoperative antibiotics.  Postop day 2 he was off all drips.  His pain was better controlled.  He was sitting up in the chair and had walked in the halls.  We discontinued his central line and Foley catheter.  He was stable to transfer to the stepdown unit for continued care.  On the floor he continued to progress.  His epicardial pacing wires were removed on 11/24/2018. Today, he is ambulating with limited assistance, his  incisions are healing well, he is tolerating room air, and he is ready for discharge home.   Consults: None  Significant Diagnostic Studies:   Cardiac Cath 11/14/2018   Prox RCA to Mid RCA lesion is 80% stenosed.  LPDA lesion is 75% stenosed.  Ost 2nd Mrg to 2nd Mrg lesion is 100% stenosed.  Mid LAD lesion is 100% stenosed.  Prox LAD to Mid LAD lesion is 75% stenosed.  Prox LAD lesion is 50% stenosed.  Dist LM to Ost LAD lesion is 50% stenosed.  The left ventricular systolic function is normal.  LV end diastolic pressure is normal.   The left ventricular ejection fraction is 55-65% by visual estimate.  There is no aortic valve stenosis.   Multivessel CAD with CTO of LAD and CTO of OM.    Plan for surgical consult.  Continue aggressive medical therapy.   Treatments:   NAME: UPTON, RUSSEY MEDICAL RECORD ZO:[id number] ACCOUNT [id number] DATE OF BIRTH:[birth year] FACILITY: [facility] LOCATION: [facility] PHYSICIAN:EDWARD Maryruth Bun, MD  OPERATIVE REPORT  DATE OF PROCEDURE:  11/21/2018  PREOPERATIVE DIAGNOSIS:  Coronary occlusive disease, 3-vessel.  POSTOPERATIVE DIAGNOSIS:  Coronary occlusive disease, 3-vessel.  SURGICAL PROCEDURE:  Coronary artery bypass grafting x4 with the left internal mammary to the left anterior descending coronary artery, reverse saphenous vein graft sequentially to the second obtuse marginal and distal circumflex, reverse saphenous vein  graft to the acute marginal with right thigh and calf greater saphenous endoscopic vein harvesting.  SURGEON:  Lanelle Bal, MD  FIRST ASSISTANT:  Nicholes Rough, [place]  Discharge Exam: Blood pressure 110/65, pulse 85, temperature 99.4 F (37.4 C), temperature source Oral, resp. rate 15, weight 85.1 kg, SpO2 92 %.     General appearance: alert, cooperative and no distress Heart: regular rate and rhythm, S1, S2 normal, no murmur, click, rub or gallop Lungs: clear to auscultation bilaterally Abdomen: soft, non-tender; bowel sounds normal; no masses,  no organomegaly Extremities: extremities normal, atraumatic, no cyanosis or edema Wound: clean and dry  Disposition: Discharge disposition: 01-Home or Self Care        Allergies as of 11/25/2018      Reactions   Quinolones Other (See Comments)   Patient was warned about not using Cipro and similar antibiotics. Recent studies have raised concern that fluoroquinolone antibiotics could be associated with an increased risk of aortic aneurysm Fluoroquinolones have non-antimicrobial properties that  might jeopardise the integrity of the extracellular matrix of the vascular wall In a  propensity score matched cohort study in [place], there was a 66% increased rate of aortic aneurysm or dissection associated with oral fluoroquinolone use, compared wit      Medication List    STOP taking these medications   aspirin-acetaminophen-caffeine 250-250-65 MG tablet Commonly known as: EXCEDRIN MIGRAINE   Co Q 10 100 MG Caps   ibuprofen 200 MG tablet Commonly known as: ADVIL   nitroGLYCERIN 0.4 MG SL tablet Commonly known as: NITROSTAT     TAKE these medications   Accu-Chek Aviva Plus w/Device Kit Use as instructed to test blood sugar daily   Accu-Chek FastClix Lancets Misc Use as instructed to test blood sugar daily   acetaminophen 500 MG tablet Commonly known as: TYLENOL Take 2 tablets (1,000 mg total) by mouth every 6 (six) hours. What changed:   when to take this  reasons to take this   aspirin 325 MG EC tablet Take 1 tablet (325 mg total) by mouth daily. Start taking on: November 26, 2018 What changed:  medication strength  how much to take   B COMPLEX PO Take 1 tablet by mouth daily.   B-12 5000 MCG Caps Take 5,000 mcg by mouth at bedtime.   calcium carbonate 500 MG chewable tablet Commonly known as: TUMS - dosed in mg elemental calcium Chew 2 tablets by mouth daily as needed for indigestion or heartburn.   folic acid 233 MCG tablet Commonly known as: FOLVITE Take 800 mcg by mouth daily.   glucose blood test strip Commonly known as: ACCU-CHEK ACTIVE STRIPS Use as instructed to test blood sugar daily   Januvia 100 MG tablet Generic drug: sitaGLIPtin TAKE 1 TABLET BY MOUTH EVERY DAY What changed:   how much to take  when to take this   metFORMIN 1000 MG tablet Commonly known as: GLUCOPHAGE Take 0.5-1 tablets (500-1,000 mg total) by mouth See admin instructions. Take 500 mg in the morning and 1000 mg in the evening   metoprolol tartrate 25 MG tablet  Commonly known as: LOPRESSOR Take 0.5 tablets (12.5 mg total) by mouth 2 (two) times daily.   Nasacort Allergy 24HR 55 MCG/ACT Aero nasal inhaler Generic drug: triamcinolone Place 1 spray into the nose daily as needed (allergies).   rosuvastatin 40 MG tablet Commonly known as: CRESTOR Take 1 tablet (40 mg total) by mouth daily. What changed: when to take this   tamsulosin 0.4 MG Caps capsule Commonly known as: FLOMAX Take 1 capsule (0.4 mg total) by mouth daily after supper. Until stone passed What changed: when to take this   traMADol 50 MG tablet Commonly known as: ULTRAM Take 1 tablet (50 mg total) by mouth every 6 (six) hours as needed for moderate pain.   Vitamin D3 250 MCG (10000 UT) capsule Take 10,000 Units by mouth at bedtime.      Follow-up Information    Baxley, Cresenciano Lick, MD. Call in 1 day(s).   Specialty: Internal Medicine Contact information: [address] [address] [phone]        Richardson Dopp T, PA-C Follow up.   Specialties: Cardiology, Physician Assistant Why: Richardson Dopp, PA-C 6/26 '@3' :30 pm (Tele health visit)  Contact information: [address] [address] [phone]        [facility] [place] Follow up.   Specialty: Cardiothoracic Surgery Why: Your routine follow-up appointment is on 12/24/2018 at 1:30pm. Please arrive at 1:00pm for a chest xray located at [facility] which is located on the first floor of our building.  Contact information: [place], [place] [phone]         The patient has been discharged on:   1.Beta Blocker:  Yes [ yes  ]                              No   [   ]                              If No, reason:  2.Ace Inhibitor/ARB: Yes [   ]                                     No  [ no   ]  If No, reason:  3.Statin:   Yes [ yes  ]                   No  [   ]                  If No, reason:  4.Ecasa:  Yes  [ yes  ]                  No   [   ]                  If No, reason:    Signed: Elgie Collard 11/25/2018, 9:58 AM

## 2018-11-24 ENCOUNTER — Inpatient Hospital Stay (HOSPITAL_COMMUNITY): Payer: Medicare HMO

## 2018-11-24 LAB — CBC
HCT: 29.8 % — ABNORMAL LOW (ref 39.0–52.0)
Hemoglobin: 10.5 g/dL — ABNORMAL LOW (ref 13.0–17.0)
MCH: 29.8 pg (ref 26.0–34.0)
MCHC: 35.2 g/dL (ref 30.0–36.0)
MCV: 84.7 fL (ref 80.0–100.0)
Platelets: 136 10*3/uL — ABNORMAL LOW (ref 150–400)
RBC: 3.52 MIL/uL — ABNORMAL LOW (ref 4.22–5.81)
RDW: 13.2 % (ref 11.5–15.5)
WBC: 10.1 10*3/uL (ref 4.0–10.5)
nRBC: 0 % (ref 0.0–0.2)

## 2018-11-24 LAB — GLUCOSE, CAPILLARY
Glucose-Capillary: 108 mg/dL — ABNORMAL HIGH (ref 70–99)
Glucose-Capillary: 130 mg/dL — ABNORMAL HIGH (ref 70–99)
Glucose-Capillary: 133 mg/dL — ABNORMAL HIGH (ref 70–99)
Glucose-Capillary: 137 mg/dL — ABNORMAL HIGH (ref 70–99)
Glucose-Capillary: 186 mg/dL — ABNORMAL HIGH (ref 70–99)
Glucose-Capillary: 94 mg/dL (ref 70–99)

## 2018-11-24 LAB — BASIC METABOLIC PANEL
Anion gap: 8 (ref 5–15)
BUN: 18 mg/dL (ref 8–23)
CO2: 24 mmol/L (ref 22–32)
Calcium: 8.5 mg/dL — ABNORMAL LOW (ref 8.9–10.3)
Chloride: 105 mmol/L (ref 98–111)
Creatinine, Ser: 1.17 mg/dL (ref 0.61–1.24)
GFR calc Af Amer: >60 mL/min (ref >60)
GFR calc non Af Amer: >60 mL/min (ref >60)
Glucose, Bld: 147 mg/dL — ABNORMAL HIGH (ref 70–99)
Potassium: 3.4 mmol/L — ABNORMAL LOW (ref 3.5–5.1)
Sodium: 137 mmol/L (ref 135–145)

## 2018-11-24 MED ORDER — METFORMIN HCL 500 MG PO TABS
1000.0000 mg | ORAL_TABLET | Freq: Every evening | ORAL | Status: DC
  Administered 2018-11-24: 1000 mg via ORAL
  Filled 2018-11-24: qty 2

## 2018-11-24 MED ORDER — INSULIN ASPART 100 UNIT/ML ~~LOC~~ SOLN
0.0000 [IU] | Freq: Three times a day (TID) | SUBCUTANEOUS | Status: DC
  Administered 2018-11-24 (×2): 2 [IU] via SUBCUTANEOUS
  Administered 2018-11-24: 4 [IU] via SUBCUTANEOUS

## 2018-11-24 MED ORDER — DOCUSATE SODIUM 100 MG PO CAPS: 200.0000 mg | ORAL_CAPSULE | Freq: Every day | ORAL | Status: DC | PRN

## 2018-11-24 MED ORDER — POTASSIUM CHLORIDE CRYS ER 20 MEQ PO TBCR
20.0000 meq | EXTENDED_RELEASE_TABLET | ORAL | Status: AC
  Administered 2018-11-24 (×3): 20 meq via ORAL
  Filled 2018-11-24 (×3): qty 1

## 2018-11-24 MED ORDER — FOLIC ACID 1 MG PO TABS
1000.0000 ug | ORAL_TABLET | Freq: Every day | ORAL | Status: DC
  Administered 2018-11-24 – 2018-11-25 (×2): 1 mg via ORAL
  Filled 2018-11-24 (×2): qty 1

## 2018-11-24 MED ORDER — LINAGLIPTIN 5 MG PO TABS
5.0000 mg | ORAL_TABLET | Freq: Every day | ORAL | Status: DC
  Administered 2018-11-24 – 2018-11-25 (×2): 5 mg via ORAL
  Filled 2018-11-24 (×2): qty 1

## 2018-11-24 MED ORDER — METFORMIN HCL 500 MG PO TABS
500.0000 mg | ORAL_TABLET | Freq: Every morning | ORAL | Status: DC
  Administered 2018-11-24 – 2018-11-25 (×2): 500 mg via ORAL
  Filled 2018-11-24 (×2): qty 1

## 2018-11-24 NOTE — Progress Notes (Signed)
Patient ID: Thomas Washington, male   DOB: 10/26/1950, 68 y.o.   MRN: [id number] [facility] DAILY ICU PROGRESS NOTE                   [address]            [place] [address]          [phone]   3 Days Post-Op Procedure(s) (LRB): CORONARY ARTERY BYPASS GRAFTING (CABG), ON PUMP, TIMES 4, USING LEFT INTERNAL MAMMARY ARTERY AND RIGHT GREATER SAPHENOUS VEIN, LIMA TO LAD, RIGHT SAPHENOUS VEIN TO OM2, RIGHT SAPHENOUS VEIN TO DISTAL CIRC, RIGHT SAPHENOUS VEIN TO ACUTE MARGINAL (N/A) TRANSESOPHAGEAL ECHOCARDIOGRAM (TEE) (N/A)  Total Length of Stay:  LOS: 3 days   Subjective: Patient notes slept better last night, still in unit waiting for stepdown bed  Objective: Vital signs in last 24 hours: Temp:  [97.5 F (36.4 C)-97.9 F (36.6 C)] 97.8 F (36.6 C) (06/13 0749) Pulse Rate:  [67-101] 101 (06/13 0749) Cardiac Rhythm: Sinus tachycardia (06/13 0749) Resp:  [14-28] 17 (06/12 1500) BP: (111-148)/(66-97) 133/83 (06/13 0749) SpO2:  [92 %-100 %] 95 % (06/13 0749) Weight:  [85.9 kg] 85.9 kg (06/13 0500)  Filed Weights   11/22/18 0414 11/23/18 0500 11/24/18 0500  Weight: 87.9 kg 84.1 kg 85.9 kg    Weight change: 1.8 kg   Hemodynamic parameters for last 24 hours:    Intake/Output from previous day: 06/12 0701 - 06/13 0700 In: 10  Out: 525 [Urine:525]  Intake/Output this shift: Total I/O In: 480 [P.O.:480] Out: -   Current Meds: Scheduled Meds: . acetaminophen  1,000 mg Oral Q6H   Or  . acetaminophen (TYLENOL) oral liquid 160 mg/5 mL  1,000 mg Per Tube Q6H  . aspirin EC  325 mg Oral Daily   Or  . aspirin  324 mg Per Tube Daily  . bisacodyl  10 mg Oral Daily   Or  . bisacodyl  10 mg Rectal Daily  . Chlorhexidine Gluconate Cloth  6 each Topical Daily  . docusate sodium  200 mg Oral Daily  . enoxaparin (LOVENOX) injection  40 mg Subcutaneous QHS  . insulin aspart  0-24 Units Subcutaneous TID AC & HS  . insulin detemir  10 Units Subcutaneous Daily  .  metoprolol tartrate  12.5 mg Oral BID  . pantoprazole  40 mg Oral Daily  . potassium chloride  20 mEq Oral Q4H  . rosuvastatin  40 mg Oral Daily  . sodium chloride flush  10-40 mL Intracatheter Q12H  . sodium chloride flush  3 mL Intravenous Q12H  . tamsulosin  0.4 mg Oral Daily   Continuous Infusions: . sodium chloride     PRN Meds:.sodium chloride, ondansetron (ZOFRAN) IV, oxyCODONE, sodium chloride flush, traMADol  General appearance: alert, cooperative and no distress Neurologic: intact Heart: regular rate and rhythm, S1, S2 normal, no murmur, click, rub or gallop Lungs: diminished breath sounds bibasilar Abdomen: soft, non-tender; bowel sounds normal; no masses,  no organomegaly Extremities: extremities normal, atraumatic, no cyanosis or edema and Homans sign is negative, no sign of DVT Wound: Sternum stable dressing intact  Lab Results: CBC: Recent Labs    11/23/18 0510 11/24/18 0317  WBC 14.4* 10.1  HGB 10.9* 10.5*  HCT 33.0* 29.8*  PLT 122* 136*   BMET:  Recent Labs    11/23/18 0510 11/24/18 0317  NA 135 137  K 3.9 3.4*  CL 103 105  CO2 22 24  GLUCOSE 151* 147*  BUN 22 18  CREATININE 1.11 1.17  CALCIUM 8.3* 8.5*    CMET: Lab Results  Component Value Date   WBC 10.1 11/24/2018   HGB 10.5 (L) 11/24/2018   HCT 29.8 (L) 11/24/2018   PLT 136 (L) 11/24/2018   GLUCOSE 147 (H) 11/24/2018   CHOL 158 07/13/2018   TRIG 83 07/13/2018   HDL 51 07/13/2018   LDLCALC 90 07/13/2018   ALT 26 11/20/2018   AST 22 11/20/2018   NA 137 11/24/2018   K 3.4 (L) 11/24/2018   CL 105 11/24/2018   CREATININE 1.17 11/24/2018   BUN 18 11/24/2018   CO2 24 11/24/2018   TSH 3.350 10/31/2014   PSA 1.6 07/13/2018   INR 1.6 (H) 11/21/2018   HGBA1C 6.5 (H) 11/20/2018   MICROALBUR 4.9 07/13/2018      PT/INR:  Recent Labs    11/21/18 1458  LABPROT 18.4*  INR 1.6*   Radiology: Dg Chest 2 View  Result Date: 11/24/2018 CLINICAL DATA:  Status post CABG EXAM: CHEST - 2  VIEW COMPARISON:  November 23, 2018 FINDINGS: The right central line is been removed. No pneumothorax. Small bilateral pleural effusions with underlying atelectasis. No other interval changes. IMPRESSION: Small bilateral pleural effusions with underlying atelectasis. The right central line has been removed. No other change. Electronically Signed   By: Dorise Bullion III M.D   On: 11/24/2018 08:18     Assessment/Plan: S/P Procedure(s) (LRB): CORONARY ARTERY BYPASS GRAFTING (CABG), ON PUMP, TIMES 4, USING LEFT INTERNAL MAMMARY ARTERY AND RIGHT GREATER SAPHENOUS VEIN, LIMA TO LAD, RIGHT SAPHENOUS VEIN TO OM2, RIGHT SAPHENOUS VEIN TO DISTAL CIRC, RIGHT SAPHENOUS VEIN TO ACUTE MARGINAL (N/A) TRANSESOPHAGEAL ECHOCARDIOGRAM (TEE) (N/A) Mobilize Diabetes control Resume oral diabetic medications DC pacing wires Transfer to stepdown Possible home 1 to 2 days Expected Acute  Blood - loss Anemia- continue to monitor    Grace Isaac 11/24/2018 8:58 AM

## 2018-11-24 NOTE — Progress Notes (Signed)
Paged [name] @[phone] .  2200: MD called back, notified pt had 12 beats of non sustained vtac, Vitals stable, pt did not feel anything. MD asked if pt got his beta blocker. Pt had already received it when I paged. He said to monitor patient. Now patient is resting, no complain of pain. Will continue to monitor.

## 2018-11-24 NOTE — Progress Notes (Signed)
Pt transferred from [facility]. He is alert and oriented x 4, walks well with standby assisted. CHG bath given, call bell with in reach, room and all equipment instructions given. EKG is normal sinus rhythm on monitor. CCMD is called with 2nd person verified. He has no pain and no acute distress seen. Will continue to monitor.  Thomas Washington, BSN,RN,PCCN,CMC,CSC

## 2018-11-24 NOTE — Progress Notes (Signed)
Patient transferred to 4E-08 via a wheelchair. A/O X 4. VS stable. All personal belongings taken. Report given.

## 2018-11-24 NOTE — Progress Notes (Signed)
Pacing wires removed. End of wires inspected and intact. Patient will remain on bedrest for 1 hour. Will continue to monitor VS closely. Patient tolerated well.

## 2018-11-25 LAB — GLUCOSE, CAPILLARY: Glucose-Capillary: 114 mg/dL — ABNORMAL HIGH (ref 70–99)

## 2018-11-25 MED ORDER — ASPIRIN 325 MG PO TBEC: 325.0000 mg | DELAYED_RELEASE_TABLET | Freq: Every day | ORAL | 0 refills | Status: DC

## 2018-11-25 MED ORDER — TRAMADOL HCL 50 MG PO TABS: 50.0000 mg | ORAL_TABLET | Freq: Four times a day (QID) | ORAL | 0 refills | Status: DC | PRN

## 2018-11-25 MED ORDER — ACETAMINOPHEN 500 MG PO TABS: 1000.0000 mg | ORAL_TABLET | Freq: Four times a day (QID) | ORAL | 0 refills | Status: AC

## 2018-11-25 MED ORDER — METOPROLOL TARTRATE 25 MG PO TABS: 12.5000 mg | ORAL_TABLET | Freq: Two times a day (BID) | ORAL | 1 refills | Status: DC

## 2018-11-25 NOTE — Progress Notes (Signed)
      [address]       [place] [address]             [phone]      4 Days Post-Op Procedure(s) (LRB): CORONARY ARTERY BYPASS GRAFTING (CABG), ON PUMP, TIMES 4, USING LEFT INTERNAL MAMMARY ARTERY AND RIGHT GREATER SAPHENOUS VEIN, LIMA TO LAD, RIGHT SAPHENOUS VEIN TO OM2, RIGHT SAPHENOUS VEIN TO DISTAL CIRC, RIGHT SAPHENOUS VEIN TO ACUTE MARGINAL (N/A) TRANSESOPHAGEAL ECHOCARDIOGRAM (TEE) (N/A) Subjective: Feels good this morning and wants to go home.   Objective: Vital signs in last 24 hours: Temp:  [98.1 F (36.7 C)-99.4 F (37.4 C)] 99.4 F (37.4 C) (06/14 0559) Pulse Rate:  [67-88] 85 (06/14 0559) Cardiac Rhythm: Normal sinus rhythm (06/14 0700) Resp:  [12-20] 15 (06/14 0845) BP: (93-137)/(59-90) 110/65 (06/14 0845) SpO2:  [92 %-99 %] 92 % (06/14 0559) Weight:  [85.1 kg] 85.1 kg (06/14 0604)     Intake/Output from previous day: 06/13 0701 - 06/14 0700 In: 1310 [P.O.:1310] Out: 1200 [Urine:1200] Intake/Output this shift: No intake/output data recorded.  General appearance: alert, cooperative and no distress Heart: regular rate and rhythm, S1, S2 normal, no murmur, click, rub or gallop Lungs: clear to auscultation bilaterally Abdomen: soft, non-tender; bowel sounds normal; no masses,  no organomegaly Extremities: extremities normal, atraumatic, no cyanosis or edema Wound: clean and dry  Lab Results: Recent Labs    11/23/18 0510 11/24/18 0317  WBC 14.4* 10.1  HGB 10.9* 10.5*  HCT 33.0* 29.8*  PLT 122* 136*   BMET:  Recent Labs    11/23/18 0510 11/24/18 0317  NA 135 137  K 3.9 3.4*  CL 103 105  CO2 22 24  GLUCOSE 151* 147*  BUN 22 18  CREATININE 1.11 1.17  CALCIUM 8.3* 8.5*    PT/INR: No results for input(s): LABPROT, INR in the last 72 hours. ABG    Component Value Date/Time   PHART 7.308 (L) 11/22/2018 0820   HCO3 22.1 11/22/2018 0820   TCO2 23 11/22/2018 0820   ACIDBASEDEF 4.0 (H) 11/22/2018 0820   O2SAT 75.1  11/22/2018 0840   CBG (last 3)  Recent Labs    11/24/18 1511 11/24/18 2104 11/25/18 0607  GLUCAP 133* 94 114*    Assessment/Plan: S/P Procedure(s) (LRB): CORONARY ARTERY BYPASS GRAFTING (CABG), ON PUMP, TIMES 4, USING LEFT INTERNAL MAMMARY ARTERY AND RIGHT GREATER SAPHENOUS VEIN, LIMA TO LAD, RIGHT SAPHENOUS VEIN TO OM2, RIGHT SAPHENOUS VEIN TO DISTAL CIRC, RIGHT SAPHENOUS VEIN TO ACUTE MARGINAL (N/A) TRANSESOPHAGEAL ECHOCARDIOGRAM (TEE) (N/A)  1. CV-NSR in the 90s, BP well controlled. Continue asa, statin and metoprolol.  2. Pulm-tolerating room air with good oxygen saturation. Encouraged to use his incentive spirometer. CXR yesterday showed small bilateral pleural effusions with underlying atelectasis.  3. Renal-creatinine 1.17, hypokalemia- replaced. Appears to be below baseline 4. H and H 10.5/29.8 5. Endo-he is back on his metformin and tradjenta. Blood glucose is well controlled  Plan: His wife is a doctor and would like to remove chest tube stiches at home in one week. Discharge instructions reviewed with patient. All questions answered.    LOS: 4 days    Elgie Collard 11/25/2018

## 2018-11-25 NOTE — Progress Notes (Signed)
Pt discharge education provided at bedside Pt verbalizes understanding  Pt has all belongings Pt IV removed, catheter intact and telemetry removed  Pt discharged via wheelchair with NT

## 2018-11-25 NOTE — Plan of Care (Signed)
Care plan progressing

## 2018-11-25 NOTE — Discharge Instructions (Signed)

## 2018-11-26 LAB — GLUCOSE, CAPILLARY: Glucose-Capillary: 196 mg/dL — ABNORMAL HIGH (ref 70–99)

## 2018-11-26 MED FILL — Electrolyte-R (PH 7.4) Solution: INTRAVENOUS | Qty: 3000 | Status: AC

## 2018-11-26 MED FILL — Heparin Sodium (Porcine) Inj 1000 Unit/ML: INTRAMUSCULAR | Qty: 10 | Status: AC

## 2018-11-26 MED FILL — Lidocaine HCl Local Soln Prefilled Syringe 100 MG/5ML (2%): INTRAMUSCULAR | Qty: 5 | Status: AC

## 2018-11-26 MED FILL — Mannitol IV Soln 20%: INTRAVENOUS | Qty: 500 | Status: AC

## 2018-11-26 MED FILL — Sodium Chloride IV Soln 0.9%: INTRAVENOUS | Qty: 3000 | Status: AC

## 2018-11-26 MED FILL — Sodium Bicarbonate IV Soln 8.4%: INTRAVENOUS | Qty: 50 | Status: AC

## 2018-11-27 ENCOUNTER — Telehealth (HOSPITAL_COMMUNITY): Payer: Self-pay | Admitting: *Deleted

## 2018-11-27 ENCOUNTER — Other Ambulatory Visit (HOSPITAL_COMMUNITY): Payer: Self-pay | Admitting: *Deleted

## 2018-11-27 DIAGNOSIS — Z951 Presence of aortocoronary bypass graft: Secondary | ICD-10-CM

## 2018-11-28 LAB — POCT I-STAT 4, (NA,K, GLUC, HGB,HCT)
Glucose, Bld: 122 mg/dL — ABNORMAL HIGH (ref 70–99)
Glucose, Bld: 154 mg/dL — ABNORMAL HIGH (ref 70–99)
Glucose, Bld: 148 mg/dL — ABNORMAL HIGH (ref 70–99)
HCT: 30 % — ABNORMAL LOW (ref 39.0–52.0)
HCT: 32 % — ABNORMAL LOW (ref 39.0–52.0)
HCT: 31 % — ABNORMAL LOW (ref 39.0–52.0)
Hemoglobin: 10.2 g/dL — ABNORMAL LOW (ref 13.0–17.0)
Hemoglobin: 10.9 g/dL — ABNORMAL LOW (ref 13.0–17.0)
Hemoglobin: 10.5 g/dL — ABNORMAL LOW (ref 13.0–17.0)
Potassium: 3.8 mmol/L (ref 3.5–5.1)
Potassium: 3.8 mmol/L (ref 3.5–5.1)
Potassium: 3 mmol/L — ABNORMAL LOW (ref 3.5–5.1)
Sodium: 143 mmol/L (ref 135–145)
Sodium: 143 mmol/L (ref 135–145)
Sodium: 144 mmol/L (ref 135–145)

## 2018-11-28 LAB — POCT I-STAT 7, (LYTES, BLD GAS, ICA,H+H)
Acid-Base Excess: 2 mmol/L (ref 0.0–2.0)
Bicarbonate: 26.3 mmol/L (ref 20.0–28.0)
Calcium, Ion: 0.94 mmol/L — ABNORMAL LOW (ref 1.15–1.40)
HCT: 31 % — ABNORMAL LOW (ref 39.0–52.0)
Hemoglobin: 10.5 g/dL — ABNORMAL LOW (ref 13.0–17.0)
O2 Saturation: 100 %
Potassium: 3.9 mmol/L (ref 3.5–5.1)
Sodium: 142 mmol/L (ref 135–145)
TCO2: 27 mmol/L (ref 22–32)
pCO2 arterial: 40 mmHg (ref 32.0–48.0)
pH, Arterial: 7.426 (ref 7.350–7.450)
pO2, Arterial: 444 mmHg — ABNORMAL HIGH (ref 83.0–108.0)

## 2018-11-29 ENCOUNTER — Telehealth: Payer: Self-pay | Admitting: *Deleted

## 2018-11-29 NOTE — Telephone Encounter (Signed)
Mr. Biggar wife called with concerns regarding the drainage from one of his chest tubes.  He is s/p CABG X 4 on 6/10 with discharge on 11/25/18.  The clear drainage began yesterday and saturates a 2x2 every hour.  I reassured her that this is not unusual.  He probably has increased his activity since being home and a pocket of fluid was released.  Just continue to keep the site covered and watch for any signs of infection or changes.  The drainage should stop in a few days.  She understood.

## 2018-12-05 ENCOUNTER — Telehealth: Payer: Self-pay | Admitting: *Deleted

## 2018-12-05 ENCOUNTER — Encounter: Payer: Self-pay | Admitting: *Deleted

## 2018-12-05 NOTE — Telephone Encounter (Signed)
error 

## 2018-12-05 NOTE — Telephone Encounter (Signed)
Patient returned your call.

## 2018-12-05 NOTE — Telephone Encounter (Signed)
Spoke with patient and patient consent to video [email or web address]     Confirm consent - "In the setting of the current Covid19 crisis, you are scheduled for a (phone or video) visit with your provider on (date) at (time).  Just as we do with many in-office visits, in order for you to participate in this visit, we must obtain consent.  If you'd like, I can send this to your mychart (if signed up) or email for you to review.  Otherwise, I can obtain your verbal consent now.  All virtual visits are billed to your insurance company just like a normal visit would be.  By agreeing to a virtual visit, we'd like you to understand that the technology does not allow for your provider to perform an examination, and thus may limit your provider's ability to fully assess your condition. If your provider identifies any concerns that need to be evaluated in person, we will make arrangements to do so.  Finally, though the technology is pretty good, we cannot assure that it will always work on either your or our end, and in the setting of a video visit, we may have to convert it to a phone-only visit.  In either situation, we cannot ensure that we have a secure connection.  Are you willing to proceed?" STAFF: Did the patient verbally acknowledge consent to telehealth visit? Document YES/NO here:  YES    TELEPHONE CALL NOTE  Thomas Washington has been deemed a candidate for a follow-up tele-health visit to limit community exposure during the Covid-19 pandemic. I spoke with the patient via phone to ensure availability of phone/video source, confirm preferred email & phone number, and discuss instructions and expectations.  I reminded Thomas Washington to be prepared with any vital sign and/or heart rhythm information that could potentially be obtained via home monitoring, at the time of his visit. I reminded Thomas Washington to expect a phone call prior to his visit.  Claude Manges, [place] 12/05/2018 10:39 AM    IF USING DOXIMITY or  [email or web address] - The patient will receive a link just prior to their visit by text.   FULL LENGTH CONSENT FOR TELE-HEALTH VISIT   I hereby voluntarily request, consent and authorize [facility] and its employed or contracted physicians, physician assistants, nurse practitioners or other licensed health care professionals (the Practitioner), to provide me with telemedicine health care services (the "Services") as deemed necessary by the treating Practitioner. I acknowledge and consent to receive the Services by the Practitioner via telemedicine. I understand that the telemedicine visit will involve communicating with the Practitioner through live audiovisual communication technology and the disclosure of certain medical information by electronic transmission. I acknowledge that I have been given the opportunity to request an in-person assessment or other available alternative prior to the telemedicine visit and am voluntarily participating in the telemedicine visit.  I understand that I have the right to withhold or withdraw my consent to the use of telemedicine in the course of my care at any time, without affecting my right to future care or treatment, and that the Practitioner or I may terminate the telemedicine visit at any time. I understand that I have the right to inspect all information obtained and/or recorded in the course of the telemedicine visit and may receive copies of available information for a reasonable fee.  I understand that some of the potential risks of receiving the Services via telemedicine include:  Marland Kitchen Delay or interruption in medical evaluation due to  technological equipment failure or disruption; . Information transmitted may not be sufficient (e.g. poor resolution of images) to allow for appropriate medical decision making by the Practitioner; and/or  . In rare instances, security protocols could fail, causing a breach of personal health information.  Furthermore, I acknowledge  that it is my responsibility to provide information about my medical history, conditions and care that is complete and accurate to the best of my ability. I acknowledge that Practitioner's advice, recommendations, and/or decision may be based on factors not within their control, such as incomplete or inaccurate data provided by me or distortions of diagnostic images or specimens that may result from electronic transmissions. I understand that the practice of medicine is not an exact science and that Practitioner makes no warranties or guarantees regarding treatment outcomes. I acknowledge that I will receive a copy of this consent concurrently upon execution via email to the email address I last provided but may also request a printed copy by calling the office of [facility].    I understand that my insurance will be billed for this visit.   I have read or had this consent read to me. . I understand the contents of this consent, which adequately explains the benefits and risks of the Services being provided via telemedicine.  . I have been provided ample opportunity to ask questions regarding this consent and the Services and have had my questions answered to my satisfaction. . I give my informed consent for the services to be provided through the use of telemedicine in my medical care  By participating in this telemedicine visit I agree to the above.

## 2018-12-05 NOTE — Telephone Encounter (Signed)
Lvm for patient to call back and give consent for video or phone visit

## 2018-12-06 NOTE — Progress Notes (Signed)
Virtual Visit via Video Note   This visit type was conducted due to national recommendations for restrictions regarding the COVID-19 Pandemic (e.g. social distancing) in an effort to limit this patient's exposure and mitigate transmission in our community.  Due to his co-morbid illnesses, this patient is at least at moderate risk for complications without adequate follow up.  This format is felt to be most appropriate for this patient at this time.  All issues noted in this document were discussed and addressed.  A limited physical exam was performed with this format.  Please refer to the patient's chart for his consent to telehealth for [facility].   Date:  12/07/2018   ID:  Thomas [place], DOB 08-Oct-1950, MRN [id number]  Patient Location: Home Provider Location: Home  PCP:  Elby Showers, MD  Cardiologist:  Mertie Moores, MD   Electrophysiologist:  None   Evaluation Performed:  Follow-Up Visit  Chief Complaint:  Post hospital follow up, s/p CABG  History of Present Illness:    Thomas [place] is a 68 y.o. male with:  Coronary artery disease  Status post CABG 11/2018  Diabetes mellitus  Hyperlipidemia  The patient was recently evaluated by [name] for dyspnea and chest pain.  He had a recent coronary calcium score that was over 2000.  Decision was made to proceed with cardiac catheterization which demonstrated multivessel CAD with chronic occlusion of the LAD and chronic occlusion of the OM.  He was referred to [name] for surgical revascularization.  He was admitted 6/10-6/14 and underwent CABG x4 (LIMA-LAD, SVG-OM and distal LCx, SVG-AM).  His postoperative course was fairly uneventful.  Today, he is seen along with his wife, Dr. Earlie Counts.  He has been doing well since discharge from the hospital.  He does get lightheaded sometimes when he stands abruptly.  His wife notes that his rhythm has been regular every time she checks it.  His chest is still somewhat sore.   He does hear a clicking sound sometimes when he moves a certain way.  He has not had any significant shortness of breath.  He has not had orthopnea.  He has not had significant leg swelling.  His appetite is good.  He has not had any fevers.  He has not had any difficulty with bowel movements.  The patient does not have symptoms concerning for COVID-19 infection (fever, chills, cough, or new shortness of breath).    Past Medical History:  Diagnosis Date  . Anginal pain ([place])   . Coronary artery disease   . Diabetes mellitus without complication ([place])   . History of kidney stones   . Hypercholesteremia   . MRSA (methicillin resistant staph aureus) culture positive    Past Surgical History:  Procedure Laterality Date  . CORONARY ARTERY BYPASS GRAFT N/A 11/21/2018   Procedure: CORONARY ARTERY BYPASS GRAFTING (CABG), ON PUMP, TIMES 4, USING LEFT INTERNAL MAMMARY ARTERY AND RIGHT GREATER SAPHENOUS VEIN, LIMA TO LAD, RIGHT SAPHENOUS VEIN TO OM2, RIGHT SAPHENOUS VEIN TO DISTAL CIRC, RIGHT SAPHENOUS VEIN TO ACUTE MARGINAL;  Surgeon: Grace Isaac, MD;  Location: [place];  Service: Open Heart Surgery;  Laterality: N/A;  . LEFT HEART CATH AND CORONARY ANGIOGRAPHY N/A 11/14/2018   Procedure: LEFT HEART CATH AND CORONARY ANGIOGRAPHY;  Surgeon: Jettie Booze, MD;  Location: [place] [facility];  Service: Cardiovascular;  Laterality: N/A;  . TEE WITHOUT CARDIOVERSION N/A 11/21/2018   Procedure: TRANSESOPHAGEAL ECHOCARDIOGRAM (TEE);  Surgeon: Grace Isaac, MD;  Location: [facility] OR;  Service: Open Heart Surgery;  Laterality: N/A;  . VASECTOMY    . WISDOM TOOTH EXTRACTION       Current Meds  Medication Sig  . ACCU-CHEK FASTCLIX LANCETS MISC Use as instructed to test blood sugar daily  . acetaminophen (TYLENOL) 500 MG tablet Take 2 tablets (1,000 mg total) by mouth every 6 (six) hours.  Marland Kitchen aspirin EC 325 MG EC tablet Take 1 tablet (325 mg total) by mouth daily.  . B Complex Vitamins (B COMPLEX PO)  Take 1 tablet by mouth daily.   . Blood Glucose Monitoring Suppl (ACCU-CHEK AVIVA PLUS) w/Device KIT Use as instructed to test blood sugar daily  . calcium carbonate (TUMS - DOSED IN MG ELEMENTAL CALCIUM) 500 MG chewable tablet Chew 2 tablets by mouth daily as needed for indigestion or heartburn.  . Cholecalciferol (VITAMIN D3) 250 MCG (10000 UT) capsule Take 10,000 Units by mouth at bedtime.  . Cyanocobalamin (B-12) 5000 MCG CAPS Take 5,000 mcg by mouth at bedtime.  . folic acid (FOLVITE) 127 MCG tablet Take 800 mcg by mouth daily.  Marland Kitchen glucose blood (ACCU-CHEK ACTIVE STRIPS) test strip Use as instructed to test blood sugar daily  . JANUVIA 100 MG tablet TAKE 1 TABLET BY MOUTH EVERY DAY (Patient taking differently: Take 100 mg by mouth every evening. )  . metFORMIN (GLUCOPHAGE) 1000 MG tablet Take 0.5-1 tablets (500-1,000 mg total) by mouth See admin instructions. Take 500 mg in the morning and 1000 mg in the evening  . rosuvastatin (CRESTOR) 40 MG tablet Take 1 tablet (40 mg total) by mouth daily. (Patient taking differently: Take 40 mg by mouth every evening. )  . tamsulosin (FLOMAX) 0.4 MG CAPS capsule Take 1 capsule (0.4 mg total) by mouth daily after supper. Until stone passed (Patient taking differently: Take 0.4 mg by mouth daily. Until stone passed)  . triamcinolone (NASACORT ALLERGY 24HR) 55 MCG/ACT AERO nasal inhaler Place 1 spray into the nose daily as needed (allergies).  . [DISCONTINUED] metoprolol tartrate (LOPRESSOR) 25 MG tablet Take 0.5 tablets (12.5 mg total) by mouth 2 (two) times daily.     Allergies:   Quinolones   Social History   Tobacco Use  . Smoking status: Never Smoker  . Smokeless tobacco: Never Used  Substance Use Topics  . Alcohol use: Yes    Comment: 1 bottle of wine monthly + 2 beers  . Drug use: No     Family Hx: The patient's family history includes Healthy in his daughter and sister; Heart attack in his father; Stroke in his mother. There is no history  of Colon cancer.  ROS:   Please see the history of present illness.     All other systems reviewed and are negative.   Prior CV studies:   The following studies were reviewed today:  Pre-CABG Dopplers 11/20/2018 Summary: Right Carotid: Velocities in the right ICA are consistent with a 1-39% stenosis. Left Carotid: Velocities in the left ICA are consistent with a 1-39% stenosis. Vertebrals: Bilateral vertebral arteries demonstrate antegrade flow. Right ABI: Resting right ankle-brachial index is within normal range. No evidence of significant right lower extremity arterial disease. Left ABI: Resting left ankle-brachial index is within normal range. No evidence of significant left lower extremity arterial disease.  Echocardiogram 11/16/2018 EF 51-70, grade 1 diastolic dysfunction, dilated aortic root 40 mm  Cardiac catheterization 11/14/2018  Prox RCA to Mid RCA lesion is 80% stenosed.  LPDA lesion is 75% stenosed.  Ost 2nd Mrg  to 2nd Mrg lesion is 100% stenosed.  Mid LAD lesion is 100% stenosed.  Prox LAD to Mid LAD lesion is 75% stenosed.  Prox LAD lesion is 50% stenosed.  Dist LM to Ost LAD lesion is 50% stenosed.  The left ventricular systolic function is normal.  LV end diastolic pressure is normal.  The left ventricular ejection fraction is 55-65% by visual estimate.  There is no aortic valve stenosis. Multivessel CAD with CTO of LAD and CTO of OM.   Plan for surgical consult.  Continue aggressive medical therapy.    Labs/Other Tests and Data Reviewed:    EKG:  No ECG reviewed.  Recent Labs: 11/20/2018: ALT 26 11/22/2018: Magnesium 2.6 11/24/2018: BUN 18; Creatinine, Ser 1.17; Hemoglobin 10.5; Platelets 136; Potassium 3.4; Sodium 137   Recent Lipid Panel Lab Results  Component Value Date/Time   CHOL 158 07/13/2018 09:06 AM   TRIG 83 07/13/2018 09:06 AM   HDL 51 07/13/2018 09:06 AM   CHOLHDL 3.1 07/13/2018 09:06 AM   LDLCALC 90 07/13/2018 09:06 AM    Wt  Readings from Last 3 Encounters:  12/07/18 177 lb 8 oz (80.5 kg)  11/25/18 187 lb 9.8 oz (85.1 kg)  11/20/18 192 lb 12.8 oz (87.5 kg)     Objective:    Vital Signs:  BP 115/75   Pulse 70   Ht '5\' 9"'$  (1.753 m)   Wt 177 lb 8 oz (80.5 kg)   BMI 26.21 kg/m    VITAL SIGNS:  reviewed GEN:  no acute distress EYES:  Sclera anicteric RESPIRATORY:  Normal respiratory effort NEURO:  alert and oriented x 3, no obvious focal deficit PSYCH:  normal affect  ASSESSMENT & PLAN:    1. Coronary artery disease involving native coronary artery of native heart without angina pectoris 2. S/P CABG x 4 He is doing well after recent CABG.  His rhythm has been regular when checked by his wife, who is a retired family physician.  He has heard some clicking sounds when he moves a certain way.  This may be from swelling but he does have an appointment with Dr. Everrett Coombe office in the next couple of weeks.  I have reassured him he will have a chest x-ray at that visit.  Continue aspirin 325 mg daily for 90 days.  He will then remain on aspirin 81 mg indefinitely.  Continue statin therapy.  Follow-up with [name] in 3 months.  3. Essential hypertension His blood pressure is controlled.  However, he has had some orthostatic symptoms.  I have asked him to reduce his metoprolol tartrate to 12.5 mg in the evening.  4. Hyperlipidemia, unspecified hyperlipidemia type Continue high-dose rosuvastatin.  Arrange follow-up lipids and LFTs in the next 4 weeks.  5. Controlled other specified diabetes mellitus with complication, unspecified whether long term insulin use ([place]) Continue follow-up with primary care.  Consider empagliflozin if further control needed.  6. Educated About Covid-19 Virus Infection The signs and symptoms of COVID-19 were discussed with the patient and how to seek care for testing (follow up with PCP or arrange E-visit).  The importance of social distancing was discussed today.  Time:   Today,  I have spent 14 minutes with the patient with telehealth technology discussing the above problems.     Medication Adjustments/Labs and Tests Ordered: Current medicines are reviewed at length with the patient today.  Concerns regarding medicines are outlined above.   Tests Ordered: Orders Placed This Encounter  Procedures  . Lipid  panel  . Hepatic function panel    Medication Changes: No orders of the defined types were placed in this encounter.   Follow Up:  In Person in 3 month(s) w/ [name]  Signed, Richardson Dopp, PA-C  12/07/2018 12:39 PM    [place]

## 2018-12-07 ENCOUNTER — Other Ambulatory Visit: Payer: Self-pay

## 2018-12-07 ENCOUNTER — Telehealth: Payer: Self-pay | Admitting: *Deleted

## 2018-12-07 ENCOUNTER — Encounter: Payer: Self-pay | Admitting: Physician Assistant

## 2018-12-07 ENCOUNTER — Telehealth (INDEPENDENT_AMBULATORY_CARE_PROVIDER_SITE_OTHER): Payer: Medicare HMO | Admitting: Physician Assistant

## 2018-12-07 VITALS — BP 115/75 | HR 70 | Ht 69.0 in | Wt 177.5 lb

## 2018-12-07 DIAGNOSIS — I2510 Atherosclerotic heart disease of native coronary artery without angina pectoris: Secondary | ICD-10-CM

## 2018-12-07 DIAGNOSIS — E785 Hyperlipidemia, unspecified: Secondary | ICD-10-CM

## 2018-12-07 DIAGNOSIS — Z951 Presence of aortocoronary bypass graft: Secondary | ICD-10-CM

## 2018-12-07 DIAGNOSIS — I10 Essential (primary) hypertension: Secondary | ICD-10-CM

## 2018-12-07 DIAGNOSIS — Z7189 Other specified counseling: Secondary | ICD-10-CM

## 2018-12-07 DIAGNOSIS — E138 Other specified diabetes mellitus with unspecified complications: Secondary | ICD-10-CM

## 2018-12-07 MED ORDER — METOPROLOL TARTRATE 25 MG PO TABS: 12.5000 mg | ORAL_TABLET | Freq: Every day | ORAL | 1 refills | Status: DC

## 2018-12-07 NOTE — Telephone Encounter (Signed)
Lvm for patient to call clinic to confirm appointments and AVS  Instructions meds. Patient has Mychart

## 2018-12-07 NOTE — Patient Instructions (Addendum)
Medication Instructions:  Decrease Metoprolol tartrate to 12.5 mg Q PM (stop the AM dose)  If you need a refill on your cardiac medications before your next appointment, please call your pharmacy.   Lab work:fasting Lipids and LFTs in 4 weeks   If you have labs (blood work) drawn today and your tests are completely normal, you will receive your results only by: Marland Kitchen MyChart Message (if you have MyChart) OR . A paper copy in the mail If you have any lab test that is abnormal or we need to change your treatment, we will call you to review the results.  Testing/Procedures:NONE ORDERED  TODAY  Follow-Up: At [facility], you and your health needs are our priority.  As part of our continuing mission to provide you with exceptional heart care, we have created designated Provider Care Teams.  These Care Teams include your primary Cardiologist (physician) and Advanced Practice Providers (APPs -  Physician Assistants and Nurse Practitioners) who all work together to provide you with the care you need, when you need it. You will need a follow up appointment in:  3 months.  You may see Mertie Moores, MD or one of the following Advanced Practice Providers on your designated Care Team: Richardson Dopp, PA-C [place], [place] . Daune Perch, NP  Any Other Special Instructions Will Be Listed Below (If Applicable).

## 2018-12-10 ENCOUNTER — Ambulatory Visit (INDEPENDENT_AMBULATORY_CARE_PROVIDER_SITE_OTHER): Payer: Medicare HMO | Admitting: Internal Medicine

## 2018-12-10 ENCOUNTER — Encounter: Payer: Self-pay | Admitting: Internal Medicine

## 2018-12-10 VITALS — BP 104/70 | Ht 67.0 in | Wt 178.0 lb

## 2018-12-10 DIAGNOSIS — I2510 Atherosclerotic heart disease of native coronary artery without angina pectoris: Secondary | ICD-10-CM

## 2018-12-10 DIAGNOSIS — I712 Thoracic aortic aneurysm, without rupture: Secondary | ICD-10-CM

## 2018-12-10 DIAGNOSIS — E8881 Metabolic syndrome: Secondary | ICD-10-CM

## 2018-12-10 DIAGNOSIS — E1169 Type 2 diabetes mellitus with other specified complication: Secondary | ICD-10-CM

## 2018-12-10 DIAGNOSIS — I10 Essential (primary) hypertension: Secondary | ICD-10-CM | POA: Diagnosis not present

## 2018-12-10 DIAGNOSIS — Z951 Presence of aortocoronary bypass graft: Secondary | ICD-10-CM

## 2018-12-10 DIAGNOSIS — G609 Hereditary and idiopathic neuropathy, unspecified: Secondary | ICD-10-CM | POA: Diagnosis not present

## 2018-12-10 DIAGNOSIS — E785 Hyperlipidemia, unspecified: Secondary | ICD-10-CM

## 2018-12-10 DIAGNOSIS — I7121 Aneurysm of the ascending aorta, without rupture: Secondary | ICD-10-CM

## 2018-12-13 ENCOUNTER — Telehealth (HOSPITAL_COMMUNITY): Payer: Self-pay

## 2018-12-13 NOTE — Telephone Encounter (Signed)
Pt insurance is active and benefits verified through [employer] Co-pay $45.00, DED 0/0 met, out of pocket $4,200/$1,415 met, co-insurance 0%. no pre-authorization required. Passport, 12/13/2018@3:301, REF# [id number] ° °Will contact patient to see if he is interested in the Cardiac Rehab Program. If interested, patient will need to complete follow up appt. Once completed, patient will be contacted for scheduling upon review by the RN Navigator. °

## 2018-12-17 ENCOUNTER — Other Ambulatory Visit: Payer: Self-pay | Admitting: Physician Assistant

## 2018-12-19 ENCOUNTER — Telehealth (HOSPITAL_COMMUNITY): Payer: Self-pay

## 2018-12-19 ENCOUNTER — Other Ambulatory Visit: Payer: Self-pay | Admitting: Cardiothoracic Surgery

## 2018-12-19 DIAGNOSIS — Z951 Presence of aortocoronary bypass graft: Secondary | ICD-10-CM

## 2018-12-20 ENCOUNTER — Telehealth (HOSPITAL_COMMUNITY): Payer: Self-pay

## 2018-12-21 DIAGNOSIS — R69 Illness, unspecified: Secondary | ICD-10-CM | POA: Diagnosis not present

## 2018-12-21 NOTE — Telephone Encounter (Signed)
Cardiac Rehab Medication Review by a Pharmacist  Spoke with patient. He did not wish to verify his medications with me at this time. He stated he had verified them at the e-check in.   Cristela Felt, PharmD PGY1 Pharmacy Resident [employer]: [phone]

## 2018-12-24 ENCOUNTER — Other Ambulatory Visit: Payer: Self-pay | Admitting: Nurse Practitioner

## 2018-12-24 ENCOUNTER — Ambulatory Visit
Admission: RE | Admit: 2018-12-24 | Discharge: 2018-12-24 | Disposition: A | Discharge status: Home / Self Care | Payer: Medicare HMO | Source: Ambulatory Visit | Attending: Cardiothoracic Surgery | Admitting: Cardiothoracic Surgery

## 2018-12-24 ENCOUNTER — Other Ambulatory Visit: Payer: Self-pay

## 2018-12-24 ENCOUNTER — Telehealth (HOSPITAL_COMMUNITY): Payer: Self-pay | Admitting: *Deleted

## 2018-12-24 ENCOUNTER — Ambulatory Visit (INDEPENDENT_AMBULATORY_CARE_PROVIDER_SITE_OTHER): Payer: Self-pay | Admitting: Surgical

## 2018-12-24 VITALS — BP 118/79 | HR 60 | Temp 97.3°F | Resp 16 | Ht 67.0 in | Wt 178.0 lb

## 2018-12-24 DIAGNOSIS — J9811 Atelectasis: Secondary | ICD-10-CM | POA: Diagnosis not present

## 2018-12-24 DIAGNOSIS — Z951 Presence of aortocoronary bypass graft: Secondary | ICD-10-CM

## 2018-12-24 DIAGNOSIS — I2510 Atherosclerotic heart disease of native coronary artery without angina pectoris: Secondary | ICD-10-CM

## 2018-12-24 MED ORDER — METOPROLOL TARTRATE 25 MG PO TABS: 12.5000 mg | ORAL_TABLET | Freq: Every day | ORAL | 3 refills | Status: DC

## 2018-12-24 NOTE — Assessment & Plan Note (Signed)
Excellent progress and postsurgical recovery at 1 month

## 2018-12-24 NOTE — Patient Instructions (Signed)
Given verbal instructions regarding activity progression including lifting and driving.

## 2018-12-24 NOTE — Progress Notes (Signed)
[address]       [place] [address]             [phone]      [facility] [place] Medical Record #[id number] Date of Birth: 1951-05-23  Referring: Nahser, Wonda Cheng, MD Primary Care: Elby Showers, MD Primary Cardiologist: Mertie Moores, MD   Chief Complaint:   POST OP FOLLOW UP OPERATIVE REPORT  DATE OF PROCEDURE:  11/21/2018  PREOPERATIVE DIAGNOSIS:  Coronary occlusive disease, 3-vessel.  POSTOPERATIVE DIAGNOSIS:  Coronary occlusive disease, 3-vessel.  SURGICAL PROCEDURE:  Coronary artery bypass grafting x4 with the left internal mammary to the left anterior descending coronary artery, reverse saphenous vein graft sequentially to the second obtuse marginal and distal circumflex, reverse saphenous vein  graft to the acute marginal with right thigh and calf greater saphenous endoscopic vein harvesting.  SURGEON:  Lanelle Bal, MD  FIRST ASSISTANT:  Nicholes Rough, PA   History of Present Illness:    The patient is a 67 year old male status post the above described procedure seen in the office on today's date and routine postsurgical follow-up.  He reports overall excellent progress.  He is not taking any narcotic pain medication at this point with only an occasional Tylenol for discomfort.  He does hear occasional clicking in his sternal incision but senses no movement of the bone itself.  He denies fevers, chills or other constitutional symptoms.  He is ambulating up to 2 miles a day.  He denies shortness of breath or anginal equivalents.  He does have occasional slight lightheadedness when standing up that he attributes possibly to beta-blocker.  He is only on Lopressor 12.5 twice daily.  Overall he is quite pleased with his postsurgical progress.      Past Medical History:  Diagnosis Date  . Anginal pain ([place])   . Coronary artery disease   . Diabetes mellitus without complication ([place])   . History of kidney stones   .  Hypercholesteremia   . MRSA (methicillin resistant staph aureus) culture positive      Social History   Tobacco Use  Smoking Status Never Smoker  Smokeless Tobacco Never Used    Social History   Substance and Sexual Activity  Alcohol Use Yes   Comment: 1 bottle of wine monthly + 2 beers     Allergies  Allergen Reactions  . Quinolones Other (See Comments)    Patient was warned about not using Cipro and similar antibiotics. Recent studies have raised concern that fluoroquinolone antibiotics could be associated with an increased risk of aortic aneurysm Fluoroquinolones have non-antimicrobial properties that might jeopardise the integrity of the extracellular matrix of the vascular wall In a  propensity score matched cohort study in [place], there was a 66% increased rate of aortic aneurysm or dissection associated with oral fluoroquinolone use, compared wit    Current Outpatient Medications  Medication Sig Dispense Refill  . ACCU-CHEK FASTCLIX LANCETS MISC Use as instructed to test blood sugar daily 100 each 2  . acetaminophen (TYLENOL) 500 MG tablet Take 2 tablets (1,000 mg total) by mouth every 6 (six) hours. 30 tablet 0  . aspirin EC 325 MG EC tablet Take 1 tablet (325 mg total) by mouth daily. 30 tablet 0  . B Complex Vitamins (B COMPLEX PO) Take 1 tablet by mouth daily.     . Blood Glucose Monitoring Suppl (ACCU-CHEK AVIVA PLUS) w/Device KIT Use as instructed to test blood sugar daily 1 kit 0  .  calcium carbonate (TUMS - DOSED IN MG ELEMENTAL CALCIUM) 500 MG chewable tablet Chew 2 tablets by mouth daily as needed for indigestion or heartburn.    . Cholecalciferol (VITAMIN D3) 250 MCG (10000 UT) capsule Take 10,000 Units by mouth at bedtime.    . Cyanocobalamin (B-12) 5000 MCG CAPS Take 5,000 mcg by mouth at bedtime.    . folic acid (FOLVITE) 761 MCG tablet Take 800 mcg by mouth daily.    Marland Kitchen glucose blood (ACCU-CHEK ACTIVE STRIPS) test strip Use as instructed to test blood sugar  daily 100 each 2  . JANUVIA 100 MG tablet TAKE 1 TABLET BY MOUTH EVERY DAY (Patient taking differently: Take 100 mg by mouth every evening. ) 90 tablet 1  . metFORMIN (GLUCOPHAGE) 1000 MG tablet Take 0.5-1 tablets (500-1,000 mg total) by mouth See admin instructions. Take 500 mg in the morning and 1000 mg in the evening    . metoprolol tartrate (LOPRESSOR) 25 MG tablet Take 0.5 tablets (12.5 mg total) by mouth daily. 30 tablet 1  . rosuvastatin (CRESTOR) 40 MG tablet Take 1 tablet (40 mg total) by mouth daily. (Patient taking differently: Take 40 mg by mouth every evening. ) 30 tablet 11  . tamsulosin (FLOMAX) 0.4 MG CAPS capsule Take 1 capsule (0.4 mg total) by mouth daily after supper. Until stone passed (Patient taking differently: Take 0.4 mg by mouth daily. Until stone passed) 15 capsule 0  . triamcinolone (NASACORT ALLERGY 24HR) 55 MCG/ACT AERO nasal inhaler Place 1 spray into the nose daily as needed (allergies).     No current facility-administered medications for this visit.        Physical Exam: BP 118/79 (BP Location: Right Arm, Patient Position: Sitting, Cuff Size: Normal)   Pulse 60   Temp (!) 97.3 F (36.3 C) Comment: thermal  Resp 16   Ht '5\' 7"'  (1.702 m)   Wt 80.7 kg   SpO2 96% Comment: RA  BMI 27.88 kg/m   General appearance: alert, cooperative and no distress Heart: regular rate and rhythm Lungs: clear to auscultation bilaterally Abdomen: soft, non-tender; bowel sounds normal; no masses,  no organomegaly Extremities: No edema Wound: Incisions well-healed without evidence of infection   Diagnostic Studies & Laboratory data:     Recent Radiology Findings:   Dg Chest 2 View  Result Date: 12/24/2018 CLINICAL DATA:  Status post CABG surgery on 11/21/2018. No current chest complaints. EXAM: CHEST - 2 VIEW COMPARISON:  11/24/2018 FINDINGS: Changes from the recent CABG surgery are noted. The cardiac silhouette is normal in size and configuration. No mediastinal  widening. No mediastinal or hilar masses or evidence of adenopathy. Linear opacities are noted at the lung bases consistent with atelectasis. Remainder of the lungs is clear. Resolved pleural effusions.  No evidence of a pneumothorax. Skeletal structures are intact. IMPRESSION: 1. No acute cardiopulmonary disease. 2. Mild residual linear lung base atelectasis. Lungs otherwise clear. 3. Resolved pleural effusions. Electronically Signed   By: Lajean Manes M.D.   On: 12/24/2018 11:10      Recent Lab Findings: Lab Results  Component Value Date   WBC 10.1 11/24/2018   HGB 10.5 (L) 11/24/2018   HCT 29.8 (L) 11/24/2018   PLT 136 (L) 11/24/2018   GLUCOSE 147 (H) 11/24/2018   CHOL 158 07/13/2018   TRIG 83 07/13/2018   HDL 51 07/13/2018   LDLCALC 90 07/13/2018   ALT 26 11/20/2018   AST 22 11/20/2018   NA 137 11/24/2018   K 3.4 (L)  11/24/2018   CL 105 11/24/2018   CREATININE 1.17 11/24/2018   BUN 18 11/24/2018   CO2 24 11/24/2018   TSH 3.350 10/31/2014   INR 1.6 (H) 11/21/2018   HGBA1C 6.5 (H) 11/20/2018      Assessment / Plan: Patient is doing quite well post operatively and there are no specific current surgically related issues.  He was given routine progression instructions regarding driving as well as ambulation.  He was also given guidelines as far as lifting.  He is into shooting guns and I told him a pistol is reasonable but I would not use any long guns at this point for another month and he reports there is minimal recoil with weapons he uses.  His diabetes has been under adequate control but I did have a discussion regarding some dietary changes he may be willing to make as insulin resistance is a significant issue in his cardiac risk stratification profile.  I did not make any changes to his current medication regimen.  I will let cardiology decide if beta-blocker should be discontinued but his current vital signs are acceptable and he is minimal symptoms associated with that.  We  will see him again on a as needed basis or at request.      Medication Changes: No orders of the defined types were placed in this encounter.    John Giovanni, PA-C 12/24/2018 11:30 AM

## 2018-12-24 NOTE — Telephone Encounter (Signed)
Called to complete health assessment with patient in preparation for orientation to Cardiac Rehab tomorrow, 12/25/2018 @ 10 am.

## 2018-12-25 ENCOUNTER — Encounter (HOSPITAL_COMMUNITY): Payer: Self-pay

## 2018-12-25 ENCOUNTER — Encounter (HOSPITAL_COMMUNITY)
Admission: RE | Admit: 2018-12-25 | Discharge: 2018-12-25 | Disposition: A | Discharge status: Home / Self Care | Payer: Medicare HMO | Source: Ambulatory Visit | Attending: Cardiovascular Disease | Admitting: Cardiovascular Disease

## 2018-12-25 ENCOUNTER — Ambulatory Visit (HOSPITAL_COMMUNITY): Payer: Medicare HMO

## 2018-12-25 VITALS — BP 108/70 | HR 64 | Temp 97.3°F | Ht 68.0 in | Wt 181.2 lb

## 2018-12-25 DIAGNOSIS — Z7984 Long term (current) use of oral hypoglycemic drugs: Secondary | ICD-10-CM | POA: Diagnosis not present

## 2018-12-25 DIAGNOSIS — Z87442 Personal history of urinary calculi: Secondary | ICD-10-CM | POA: Insufficient documentation

## 2018-12-25 DIAGNOSIS — E119 Type 2 diabetes mellitus without complications: Secondary | ICD-10-CM | POA: Insufficient documentation

## 2018-12-25 DIAGNOSIS — Z951 Presence of aortocoronary bypass graft: Secondary | ICD-10-CM | POA: Diagnosis not present

## 2018-12-25 DIAGNOSIS — I2510 Atherosclerotic heart disease of native coronary artery without angina pectoris: Secondary | ICD-10-CM | POA: Diagnosis not present

## 2018-12-25 DIAGNOSIS — E7800 Pure hypercholesterolemia, unspecified: Secondary | ICD-10-CM | POA: Diagnosis not present

## 2018-12-25 DIAGNOSIS — Z79899 Other long term (current) drug therapy: Secondary | ICD-10-CM | POA: Diagnosis not present

## 2018-12-25 DIAGNOSIS — Z7982 Long term (current) use of aspirin: Secondary | ICD-10-CM | POA: Diagnosis not present

## 2018-12-25 LAB — GLUCOSE, CAPILLARY: Glucose-Capillary: 190 mg/dL — ABNORMAL HIGH (ref 70–99)

## 2018-12-25 NOTE — Progress Notes (Signed)
Pt is interested in participating in Virtual Cardiac Rehab in addition to participating in phase 2 cardiac rehab. Pt advised that Virtual Cardiac Rehab is provided at no cost to the patient.  Checklist:  1. Pt has smart device  ie smartphone and/or ipad for downloading an app  Yes 2. Reliable internet/wifi service    Yes 3. Understands how to use their smartphone and navigate within an app.  Yes   Reviewed with pt the scheduling process for virtual cardiac rehab.  Pt verbalized understanding.            Confirm Consent - In the setting of the current Covid19 crisis, you are scheduled for a phone visit with your Cardiac or Pulmonary team member.  Just as we do with many in-gym visits, in order for you to participate in this visit, we must obtain consent.  If you'd like, I can send this to your mychart (if signed up) or email for you to review.  Otherwise, I can obtain your verbal consent now.  By agreeing to a telephone visit, we'd like you to understand that the technology does not allow for your Cardiac or Pulmonary Rehab team member to perform a physical assessment, and thus may limit their ability to fully assess your ability to perform exercise programs. If your provider identifies any concerns that need to be evaluated in person, we will make arrangements to do so.  Finally, though the technology is pretty good, we cannot assure that it will always work on either your or our end and we cannot ensure that we have a secure connection.  Cardiac and Pulmonary Rehab Telehealth visits and "At Home" cardiac and pulmonary rehab are provided at no cost to you.        Are you willing to proceed?"        STAFF: Did the patient verbally acknowledge consent to telehealth visit? Document YES/NO here: Yes     Barnet Pall RN BSN  Cardiac and Pulmonary Rehab Staff        Date 12/25/18   @ Time 11:20AM

## 2018-12-25 NOTE — Progress Notes (Signed)
Cardiac Individual Treatment Plan  Patient Details  Name: Thomas Washington MRN: [id number] Date of Birth: 03/27/1951 Referring Provider:     CARDIAC REHAB PHASE II ORIENTATION from 12/25/2018 in [place]  Referring Provider  Dr. Cathie Olden      Initial Encounter Date:    CARDIAC REHAB PHASE II ORIENTATION from 12/25/2018 in [place]  Date  12/25/18      Visit Diagnosis: S/P CABG x 4 11/21/18  Patient's Home Medications on Admission:  Current Outpatient Medications:  .  ACCU-CHEK FASTCLIX LANCETS MISC, Use as instructed to test blood sugar daily, Disp: 100 each, Rfl: 2 .  acetaminophen (TYLENOL) 500 MG tablet, Take 2 tablets (1,000 mg total) by mouth every 6 (six) hours., Disp: 30 tablet, Rfl: 0 .  aspirin EC 325 MG EC tablet, Take 1 tablet (325 mg total) by mouth daily., Disp: 30 tablet, Rfl: 0 .  B Complex Vitamins (B COMPLEX PO), Take 1 tablet by mouth daily. , Disp: , Rfl:  .  Blood Glucose Monitoring Suppl (ACCU-CHEK AVIVA PLUS) w/Device KIT, Use as instructed to test blood sugar daily, Disp: 1 kit, Rfl: 0 .  calcium carbonate (TUMS - DOSED IN MG ELEMENTAL CALCIUM) 500 MG chewable tablet, Chew 2 tablets by mouth daily as needed for indigestion or heartburn., Disp: , Rfl:  .  Cholecalciferol (VITAMIN D3) 250 MCG (10000 UT) capsule, Take 10,000 Units by mouth at bedtime., Disp: , Rfl:  .  Cyanocobalamin (B-12) 5000 MCG CAPS, Take 5,000 mcg by mouth at bedtime., Disp: , Rfl:  .  folic acid (FOLVITE) 010 MCG tablet, Take 800 mcg by mouth daily., Disp: , Rfl:  .  glucose blood (ACCU-CHEK ACTIVE STRIPS) test strip, Use as instructed to test blood sugar daily, Disp: 100 each, Rfl: 2 .  JANUVIA 100 MG tablet, TAKE 1 TABLET BY MOUTH EVERY DAY (Patient taking differently: Take 100 mg by mouth every evening. ), Disp: 90 tablet, Rfl: 1 .  metFORMIN (GLUCOPHAGE) 1000 MG tablet, Take 0.5-1 tablets (500-1,000 mg total) by mouth See  admin instructions. Take 500 mg in the morning and 1000 mg in the evening, Disp: , Rfl:  .  metoprolol tartrate (LOPRESSOR) 25 MG tablet, Take 0.5 tablets (12.5 mg total) by mouth daily., Disp: 45 tablet, Rfl: 3 .  rosuvastatin (CRESTOR) 40 MG tablet, Take 1 tablet (40 mg total) by mouth daily. (Patient taking differently: Take 40 mg by mouth every evening. ), Disp: 30 tablet, Rfl: 11 .  tamsulosin (FLOMAX) 0.4 MG CAPS capsule, Take 1 capsule (0.4 mg total) by mouth daily after supper. Until stone passed (Patient taking differently: Take 0.4 mg by mouth daily. Until stone passed), Disp: 15 capsule, Rfl: 0 .  triamcinolone (NASACORT ALLERGY 24HR) 55 MCG/ACT AERO nasal inhaler, Place 1 spray into the nose daily as needed (allergies)., Disp: , Rfl:   Past Medical History: Past Medical History:  Diagnosis Date  . Anginal pain ([place])   . Coronary artery disease   . Diabetes mellitus without complication ([place])   . History of kidney stones   . Hypercholesteremia   . MRSA (methicillin resistant staph aureus) culture positive     Tobacco Use: Social History   Tobacco Use  Smoking Status Never Smoker  Smokeless Tobacco Never Used    Labs: Recent Review Flowsheet Data    Labs for ITP Cardiac and Pulmonary Rehab Latest Ref Rng & Units 11/21/2018 11/21/2018 11/21/2018 11/22/2018 11/22/2018   Cholestrol <200  mg/dL - - - - -   LDLCALC mg/dL (calc) - - - - -   HDL >40 mg/dL - - - - -   Trlycerides <150 mg/dL - - - - -   Hemoglobin A1c 4.8 - 5.6 % - - - - -   PHART 7.350 - 7.450 7.324(L) 7.327(L) - 7.308(L) -   PCO2ART 32.0 - 48.0 mmHg 42.3 43.3 - 44.2 -   HCO3 20.0 - 28.0 mmol/L 21.9 22.5 - 22.1 -   TCO2 22 - 32 mmol/L 23 24 21(L) 23 -   ACIDBASEDEF 0.0 - 2.0 mmol/L 4.0(H) 3.0(H) - 4.0(H) -   O2SAT % 98.0 99.0 - 97.0 75.1      Capillary Blood Glucose: Lab Results  Component Value Date   GLUCAP 190 (H) 12/25/2018   GLUCAP 196 (H) 11/25/2018   GLUCAP 114 (H) 11/25/2018   GLUCAP 94 11/24/2018    GLUCAP 133 (H) 11/24/2018     Exercise Target Goals: Exercise Program Goal: Individual exercise prescription set using results from initial 6 min walk test and THRR while considering  patient's activity barriers and safety.   Exercise Prescription Goal: Starting with aerobic activity 30 plus minutes a day, 3 days per week for initial exercise prescription. Provide home exercise prescription and guidelines that participant acknowledges understanding prior to discharge.  Activity Barriers & Risk Stratification: Activity Barriers & Cardiac Risk Stratification - 12/25/18 1112      Activity Barriers & Cardiac Risk Stratification   Activity Barriers  Other (comment)    Comments  Limited ROM in R Shoulder    Cardiac Risk Stratification  High       6 Minute Walk: 6 Minute Walk    Row Name 12/25/18 1111         6 Minute Walk   Phase  Initial     Distance  1960 feet     Walk Time  6 minutes     # of Rest Breaks  0     MPH  3.7     METS  4.11     RPE  12     Perceived Dyspnea   0     VO2 Peak  14.39     Symptoms  No     Resting HR  64 bpm     Resting BP  108/70     Resting Oxygen Saturation   97 %     Exercise Oxygen Saturation  during 6 min walk  100 %     Max Ex. HR  90 bpm     Max Ex. BP  120/70     2 Minute Post BP  110/70        Oxygen Initial Assessment:   Oxygen Re-Evaluation:   Oxygen Discharge (Final Oxygen Re-Evaluation):   Initial Exercise Prescription: Initial Exercise Prescription - 12/25/18 1100      Date of Initial Exercise RX and Referring Provider   Date  12/25/18    Referring Provider  Dr. Cathie Olden    Expected Discharge Date  02/08/19      Treadmill   MPH  3.8    Grade  1    Minutes  15      NuStep   Level  3    SPM  85    Minutes  15    METs  4      Prescription Details   Frequency (times per week)  3    Duration  Progress to 30 minutes  of continuous aerobic without signs/symptoms of physical distress      Intensity   THRR  40-80% of Max Heartrate  62-123    Ratings of Perceived Exertion  11-13      Progression   Progression  Continue to progress workloads to maintain intensity without signs/symptoms of physical distress.      Resistance Training   Training Prescription  Yes    Weight  4 lbs.     Reps  10-15       Perform Capillary Blood Glucose checks as needed.  Exercise Prescription Changes:   Exercise Comments:   Exercise Goals and Review: Exercise Goals    Row Name 12/25/18 1116             Exercise Goals   Increase Physical Activity  Yes       Intervention  Provide advice, education, support and counseling about physical activity/exercise needs.;Develop an individualized exercise prescription for aerobic and resistive training based on initial evaluation findings, risk stratification, comorbidities and participant's personal goals.       Expected Outcomes  Short Term: Attend rehab on a regular basis to increase amount of physical activity.;Long Term: Add in home exercise to make exercise part of routine and to increase amount of physical activity.;Long Term: Exercising regularly at least 3-5 days a week.       Increase Strength and Stamina  Yes       Intervention  Provide advice, education, support and counseling about physical activity/exercise needs.;Develop an individualized exercise prescription for aerobic and resistive training based on initial evaluation findings, risk stratification, comorbidities and participant's personal goals.       Expected Outcomes  Short Term: Increase workloads from initial exercise prescription for resistance, speed, and METs.;Short Term: Perform resistance training exercises routinely during rehab and add in resistance training at home;Long Term: Improve cardiorespiratory fitness, muscular endurance and strength as measured by increased METs and functional capacity (6MWT)       Able to understand and use rate of perceived exertion (RPE) scale  Yes        Intervention  Provide education and explanation on how to use RPE scale       Expected Outcomes  Short Term: Able to use RPE daily in rehab to express subjective intensity level;Long Term:  Able to use RPE to guide intensity level when exercising independently       Knowledge and understanding of Target Heart Rate Range (THRR)  Yes       Intervention  Provide education and explanation of THRR including how the numbers were predicted and where they are located for reference       Expected Outcomes  Short Term: Able to state/look up THRR;Long Term: Able to use THRR to govern intensity when exercising independently;Short Term: Able to use daily as guideline for intensity in rehab       Able to check pulse independently  Yes       Intervention  Provide education and demonstration on how to check pulse in carotid and radial arteries.;Review the importance of being able to check your own pulse for safety during independent exercise       Expected Outcomes  Short Term: Able to explain why pulse checking is important during independent exercise;Long Term: Able to check pulse independently and accurately       Understanding of Exercise Prescription  Yes       Intervention  Provide education, explanation, and written materials on patient's individual exercise prescription  Expected Outcomes  Short Term: Able to explain program exercise prescription;Long Term: Able to explain home exercise prescription to exercise independently          Exercise Goals Re-Evaluation :    Discharge Exercise Prescription (Final Exercise Prescription Changes):   Nutrition:  Target Goals: Understanding of nutrition guidelines, daily intake of sodium '1500mg'$ , cholesterol '200mg'$ , calories 30% from fat and 7% or less from saturated fats, daily to have 5 or more servings of fruits and vegetables.  Biometrics: Pre Biometrics - 12/25/18 1117      Pre Biometrics   Height  '5\' 8"'$  (1.727 m)    Weight  82.2 kg    Waist  Circumference  38.5 inches    Hip Circumference  37 inches    Waist to Hip Ratio  1.04 %    BMI (Calculated)  27.56    Triceps Skinfold  28 mm    % Body Fat  29.3 %    Grip Strength  37 kg    Flexibility  0 in    Single Leg Stand  25.75 seconds        Nutrition Therapy Plan and Nutrition Goals:   Nutrition Assessments:   Nutrition Goals Re-Evaluation:   Nutrition Goals Discharge (Final Nutrition Goals Re-Evaluation):   Psychosocial: Target Goals: Acknowledge presence or absence of significant depression and/or stress, maximize coping skills, provide positive support system. Participant is able to verbalize types and ability to use techniques and skills needed for reducing stress and depression.  Initial Review & Psychosocial Screening: Initial Psych Review & Screening - 12/25/18 1144      Initial Review   Current issues with  None Identified      Family Dynamics   Good Support System?  Yes   Clair Gulling has his wife for support     Barriers   Psychosocial barriers to participate in program  There are no identifiable barriers or psychosocial needs.      Screening Interventions   Interventions  Encouraged to exercise       Quality of Life Scores: Quality of Life - 12/25/18 1116      Quality of Life   Select  Quality of Life      Quality of Life Scores   Health/Function Pre  22.4 %    Socioeconomic Pre  22.79 %    Psych/Spiritual Pre  22.64 %    Family Pre  27.6 %    GLOBAL Pre  23.29 %      Scores of 19 and below usually indicate a poorer quality of life in these areas.  A difference of  2-3 points is a clinically meaningful difference.  A difference of 2-3 points in the total score of the Quality of Life Index has been associated with significant improvement in overall quality of life, self-image, physical symptoms, and general health in studies assessing change in quality of life.  PHQ-9: Recent Review Flowsheet Data    Depression screen [facility] 2/9 12/25/2018  07/17/2018 06/22/2017 11/03/2014   Decreased Interest 0 0 0 0   Down, Depressed, Hopeless 0 0 0 0   PHQ - 2 Score 0 0 0 0     Interpretation of Total Score  Total Score Depression Severity:  1-4 = Minimal depression, 5-9 = Mild depression, 10-14 = Moderate depression, 15-19 = Moderately severe depression, 20-27 = Severe depression   Psychosocial Evaluation and Intervention:   Psychosocial Re-Evaluation:   Psychosocial Discharge (Final Psychosocial Re-Evaluation):   Vocational Rehabilitation: Provide vocational  rehab assistance to qualifying candidates.   Vocational Rehab Evaluation & Intervention: Vocational Rehab - 12/25/18 1145      Initial Vocational Rehab Evaluation & Intervention   Assessment shows need for Vocational Rehabilitation  No       Education: Education Goals: Education classes will be provided on a weekly basis, covering required topics. Participant will state understanding/return demonstration of topics presented.  Learning Barriers/Preferences: Learning Barriers/Preferences - 12/25/18 1118      Learning Barriers/Preferences   Learning Barriers  None    Learning Preferences  Written Material       Education Topics: Hypertension, Hypertension Reduction -Define heart disease and high blood pressure. Discus how high blood pressure affects the body and ways to reduce high blood pressure.   Exercise and Your Heart -Discuss why it is important to exercise, the FITT principles of exercise, normal and abnormal responses to exercise, and how to exercise safely.   Angina -Discuss definition of angina, causes of angina, treatment of angina, and how to decrease risk of having angina.   Cardiac Medications -Review what the following cardiac medications are used for, how they affect the body, and side effects that may occur when taking the medications.  Medications include Aspirin, Beta blockers, calcium channel blockers, ACE Inhibitors, angiotensin receptor  blockers, diuretics, digoxin, and antihyperlipidemics.   Congestive Heart Failure -Discuss the definition of CHF, how to live with CHF, the signs and symptoms of CHF, and how keep track of weight and sodium intake.   Heart Disease and Intimacy -Discus the effect sexual activity has on the heart, how changes occur during intimacy as we age, and safety during sexual activity.   Smoking Cessation / COPD -Discuss different methods to quit smoking, the health benefits of quitting smoking, and the definition of COPD.   Nutrition I: Fats -Discuss the types of cholesterol, what cholesterol does to the heart, and how cholesterol levels can be controlled.   Nutrition II: Labels -Discuss the different components of food labels and how to read food label   Heart Parts/Heart Disease and PAD -Discuss the anatomy of the heart, the pathway of blood circulation through the heart, and these are affected by heart disease.   Stress I: Signs and Symptoms -Discuss the causes of stress, how stress may lead to anxiety and depression, and ways to limit stress.   Stress II: Relaxation -Discuss different types of relaxation techniques to limit stress.   Warning Signs of Stroke / TIA -Discuss definition of a stroke, what the signs and symptoms are of a stroke, and how to identify when someone is having stroke.   Knowledge Questionnaire Score: Knowledge Questionnaire Score - 12/25/18 1118      Knowledge Questionnaire Score   Pre Score  24/24       Core Components/Risk Factors/Patient Goals at Admission: Personal Goals and Risk Factors at Admission - 12/25/18 1118      Core Components/Risk Factors/Patient Goals on Admission    Weight Management  Yes;Weight Maintenance;Weight Loss    Admit Weight  181 lb 3.5 oz (82.2 kg)    Diabetes  Yes    Intervention  Provide education about signs/symptoms and action to take for hypo/hyperglycemia.;Provide education about proper nutrition, including  hydration, and aerobic/resistive exercise prescription along with prescribed medications to achieve blood glucose in normal ranges: Fasting glucose 65-99 mg/dL    Expected Outcomes  Short Term: Participant verbalizes understanding of the signs/symptoms and immediate care of hyper/hypoglycemia, proper foot care and importance of medication, aerobic/resistive exercise  and nutrition plan for blood glucose control.;Long Term: Attainment of HbA1C < 7%.    Hypertension  Yes    Intervention  Provide education on lifestyle modifcations including regular physical activity/exercise, weight management, moderate sodium restriction and increased consumption of fresh fruit, vegetables, and low fat dairy, alcohol moderation, and smoking cessation.;Monitor prescription use compliance.    Expected Outcomes  Short Term: Continued assessment and intervention until BP is < 140/73m HG in hypertensive participants. < 130/812mHG in hypertensive participants with diabetes, heart failure or chronic kidney disease.;Long Term: Maintenance of blood pressure at goal levels.    Lipids  Yes    Intervention  Provide education and support for participant on nutrition & aerobic/resistive exercise along with prescribed medications to achieve LDL '70mg'$ , HDL >'40mg'$ .    Expected Outcomes  Short Term: Participant states understanding of desired cholesterol values and is compliant with medications prescribed. Participant is following exercise prescription and nutrition guidelines.;Long Term: Cholesterol controlled with medications as prescribed, with individualized exercise RX and with personalized nutrition plan. Value goals: LDL < '70mg'$ , HDL > 40 mg.    Stress  Yes    Intervention  Offer individual and/or small group education and counseling on adjustment to heart disease, stress management and health-related lifestyle change. Teach and support self-help strategies.;Refer participants experiencing significant psychosocial distress to  appropriate mental health specialists for further evaluation and treatment. When possible, include family members and significant others in education/counseling sessions.       Core Components/Risk Factors/Patient Goals Review:    Core Components/Risk Factors/Patient Goals at Discharge (Final Review):    ITP Comments: ITP Comments    Row Name 12/25/18 1039           ITP Comments  Dr. TrFransico HimMedical Director          Comments: Patient attended orientation on 12/25/2018 to review rules and guidelines for program.  Completed 6 minute walk test, Intitial ITP, and exercise prescription.  VSS. Telemetry-Sinus Rhythm.  Asymptomatic. Safety measures and social distancing in place per CDC guidelines.Appointment made for JiClair Gullingo participate in virtual cardiac rehab as JiClair Gullingould like to participate in virtual cardiac rehab as well.MaBarnet PallRN,BSN 12/25/2018 11:57 AM

## 2018-12-26 ENCOUNTER — Encounter (HOSPITAL_COMMUNITY)
Admission: RE | Admit: 2018-12-26 | Discharge: 2018-12-26 | Disposition: A | Discharge status: Home / Self Care | Payer: Medicare HMO | Source: Ambulatory Visit | Attending: Cardiovascular Disease | Admitting: Cardiovascular Disease

## 2018-12-26 ENCOUNTER — Other Ambulatory Visit: Payer: Self-pay

## 2018-12-26 ENCOUNTER — Telehealth (HOSPITAL_COMMUNITY): Payer: Self-pay | Admitting: *Deleted

## 2018-12-26 NOTE — Telephone Encounter (Signed)
         Confirm Consent - In the setting of the current Covid19 crisis, you are scheduled for a phone visit with your Cardiac or Pulmonary team member.  Just as we do with many in-gym visits, in order for you to participate in this visit, we must obtain consent.  If you'd like, I can send this to your mychart (if signed up) or email for you to review.  Otherwise, I can obtain your verbal consent now.  By agreeing to a telephone visit, we'd like you to understand that the technology does not allow for your Cardiac or Pulmonary Rehab team member to perform a physical assessment, and thus may limit their ability to fully assess your ability to perform exercise programs. If your provider identifies any concerns that need to be evaluated in person, we will make arrangements to do so.  Finally, though the technology is pretty good, we cannot assure that it will always work on either your or our end and we cannot ensure that we have a secure connection.  Cardiac and Pulmonary Rehab Telehealth visits and "At Home" cardiac and pulmonary rehab are provided at no cost to you.        Are you willing to proceed?"        STAFF: Did the patient verbally acknowledge consent to telehealth visit? Document YES/NO here: Yes     Maurice Small RN, BSN Cardiac and Pulmonary Rehab Nurse Navigator    Cardiac and Pulmonary Rehab Staff        Date 12/26/18   @ Time 1511    Pt is interested in participating in Virtual Cardiac Rehab. Pt advised that Virtual Cardiac Rehab is provided at no cost to the patient.  Checklist:  1. Pt has smart device  ie smartphone and/or ipad for downloading an app  Yes 2. Reliable internet/wifi service    Yes 3. Understands how to use their smartphone and navigate within an app.  Yes   Reviewed with pt the scheduling process for virtual cardiac rehab.  Pt verbalized understanding.

## 2018-12-26 NOTE — Progress Notes (Signed)
Called and spoke to pt regarding Virtual Cardiac Rehab.  Pt  was able to download the [employer] app on their smart device with no issues. Pt set up their account and received the following welcome message -"Welcome to the [facility] and Pulmonary Rehabilitation program. We hope that you will find the exercise program beneficial in your recovery process. Our staff is available to assist with any questions/concerns about your exercise routine. Best wishes". Brief orientation provided to with the advisement to watch the "Intro to Rehab" series located under the Resource tab. Pt verbalized understanding. Will continue to follow and monitor pt progress with feedback as needed. Cherre Huger, BSN Cardiac and Training and development officer

## 2018-12-29 ENCOUNTER — Encounter: Payer: Self-pay | Admitting: Internal Medicine

## 2018-12-29 NOTE — Progress Notes (Signed)
   Subjective:    Patient ID: Thomas Washington, male    DOB: [birth year], 68 y.o.   MRN: [id number]  HPI 68 year old Male seen by interactive audio and video telecommunications today due to the coronavirus pandemic.  He is identified as Thomas Washington. Wurth, a longstanding patient in this practice using 2 identifiers.  He is accompanied by his wife today.  He is agreeable to visit in this format.  Patient was hospitalized June 10 through June 14 for coronary artery bypass graft surgery x 4  by [name].  He had a welcome to Medicare visit in February and was experiencing some shortness of breath with exertion which was unusual for him.  Strong family history of disease in his father who died of an MI at age 63.  Mother died in her 24s of a stroke.  One sister who is healthy.  Daughter is healthy.  Patient has remote history of acute pericarditis in 2014.  He has a history of hyperlipidemia, diabetes mellitus and essential hypertension.  He did extremely well postoperatively and by day 2 he was off all IV medications and walking in the halls.  He says he feels well.  No shortness of breath or chest pain at present time.  Says vital signs have been stable at home.  No new complaints or concerns.  Has been referred to cardiac rehab.    Review of Systems see above     Objective:   Physical Exam  For stable Accu-Cheks and blood pressure      Assessment & Plan:  Status post coronary artery bypass surgery x4 doing well  Diabetes mellitus-stable  Essential hypertension-stable on current regimen  Hyperlipidemia treated with statin medication  Metabolic syndrome  He has an ascending thoracic aneurysm mildly dilated 4.5 cm.  This will need to be followed.  Plan: He has follow-up appointment here in September and I will ask him to keep that appointment in person.  I am pleased with his progress and he looks great.

## 2018-12-29 NOTE — Patient Instructions (Signed)
It was a pleasure to see you today virtually.  Continue current medications and follow-up here in September

## 2018-12-31 ENCOUNTER — Encounter (HOSPITAL_COMMUNITY)
Admission: RE | Admit: 2018-12-31 | Discharge: 2018-12-31 | Disposition: A | Discharge status: Home / Self Care | Payer: Medicare HMO | Source: Ambulatory Visit | Attending: Cardiovascular Disease | Admitting: Cardiovascular Disease

## 2018-12-31 ENCOUNTER — Other Ambulatory Visit: Payer: Self-pay

## 2018-12-31 DIAGNOSIS — Z951 Presence of aortocoronary bypass graft: Secondary | ICD-10-CM

## 2018-12-31 DIAGNOSIS — Z7984 Long term (current) use of oral hypoglycemic drugs: Secondary | ICD-10-CM | POA: Diagnosis not present

## 2018-12-31 DIAGNOSIS — E7800 Pure hypercholesterolemia, unspecified: Secondary | ICD-10-CM | POA: Diagnosis not present

## 2018-12-31 DIAGNOSIS — Z87442 Personal history of urinary calculi: Secondary | ICD-10-CM | POA: Diagnosis not present

## 2018-12-31 DIAGNOSIS — Z7982 Long term (current) use of aspirin: Secondary | ICD-10-CM | POA: Diagnosis not present

## 2018-12-31 DIAGNOSIS — I2510 Atherosclerotic heart disease of native coronary artery without angina pectoris: Secondary | ICD-10-CM | POA: Diagnosis not present

## 2018-12-31 DIAGNOSIS — E119 Type 2 diabetes mellitus without complications: Secondary | ICD-10-CM | POA: Diagnosis not present

## 2018-12-31 DIAGNOSIS — Z79899 Other long term (current) drug therapy: Secondary | ICD-10-CM | POA: Diagnosis not present

## 2018-12-31 LAB — GLUCOSE, CAPILLARY
Glucose-Capillary: 211 mg/dL — ABNORMAL HIGH (ref 70–99)
Glucose-Capillary: 123 mg/dL — ABNORMAL HIGH (ref 70–99)
Glucose-Capillary: 89 mg/dL (ref 70–99)

## 2018-12-31 NOTE — Progress Notes (Signed)
Daily Session Note  Patient Details  Name: Thomas Washington MRN: [id number] Date of Birth: [birth year] Referring Provider:     [facility] from 12/25/2018 in [place]  Referring Provider  Dr. Cathie Olden      Encounter Date: 12/31/2018  Check In: Session Check In - 12/31/18 0926      Check-In   Supervising physician immediately available to respond to emergencies  [facility] immediately available    Physician(s)  Dr. Earnest Conroy    Location  [facility]    Staff Present  Dorma Russell, MS,ACSM CEP, Exercise Physiologist;Lavarius Doughten, RN, BSN;Brittany Durene Fruits, BS, ACSM CEP, Exercise Physiologist;Olinty Celesta Aver, MS, ACSM CEP, Exercise Physiologist;Joan Leonia Reeves, RN, BSN    Virtual Visit  No    Medication changes reported      No    Fall or balance concerns reported     No    Tobacco Cessation  No Change    Warm-up and Cool-down  Performed on first and last piece of equipment    Resistance Training Performed  Yes    VAD Patient?  No    PAD/SET Patient?  No      Pain Assessment   Currently in Pain?  No/denies    Multiple Pain Sites  No       Capillary Blood Glucose: Results for orders placed or performed during the hospital encounter of 12/31/18 (from the past 24 hour(s))  Glucose, capillary     Status: Abnormal   Collection Time: 12/31/18  8:40 AM  Result Value Ref Range   Glucose-Capillary 211 (H) 70 - 99 mg/dL  Glucose, capillary     Status: None   Collection Time: 12/31/18  9:45 AM  Result Value Ref Range   Glucose-Capillary 89 70 - 99 mg/dL  Glucose, capillary     Status: Abnormal   Collection Time: 12/31/18 10:07 AM  Result Value Ref Range   Glucose-Capillary 123 (H) 70 - 99 mg/dL    Exercise Prescription Changes - 12/31/18 1000      Response to Exercise   Blood Pressure (Admit)  104/62    Blood Pressure (Exercise)  130/80    Blood Pressure (Exit)  118/78    Heart Rate (Admit)  68 bpm    Heart Rate (Exercise)  107 bpm    Heart Rate (Exit)  69 bpm    Rating of Perceived Exertion (Exercise)  11    Symptoms  none    Comments  blood sugar dropped to 89 after exe, lemonade given. Also, pt states he take metoprolol after exe.    Duration  Continue with 30 min of aerobic exercise without signs/symptoms of physical distress.    Intensity  THRR unchanged      Progression   Progression  Continue to progress workloads to maintain intensity without signs/symptoms of physical distress.    Average METs  3.3      Resistance Training   Training Prescription  Yes    Weight  4 lbs.     Reps  10-15    Time  10 Minutes      Interval Training   Interval Training  No      Treadmill   MPH  3.4    Grade  1    Minutes  15    METs  4.07      NuStep   Level  3    SPM  85    Minutes  15  METs  2.6      Home Exercise Plan   Plans to continue exercise at  Home (comment)   Walking 2.5 miles daily.   Frequency  Add 4 additional days to program exercise sessions.       Social History   Tobacco Use  Smoking Status Never Smoker  Smokeless Tobacco Never Used    Goals Met:  Exercise tolerated well No report of cardiac concerns or symptoms  Goals Unmet:  Not Applicable  Comments: Pt started cardiac rehab today.  Pt tolerated light exercise without difficulty. VSS, telemetry-Sinus rhythm occasional PVC, asymptomatic.  Medication list reconciled. Pt denies barriers to medicaiton compliance.  PSYCHOSOCIAL ASSESSMENT:  PHQ-0. Pt exhibits positive coping skills, hopeful outlook with supportive family. No psychosocial needs identified at this time, no psychosocial interventions necessary.    Pt enjoys walking with his wife..   Pt oriented to exercise equipment and routine.  Post exercise CBG 89, asymptomatic.Marland Kitchen Patient was given lemonade. Recheck CBG 123  Understanding verbalized.Barnet Pall, RN,BSN 12/31/2018 11:49 AM   Dr. Fransico Him is Medical Director for Cardiac Rehab at  [facility].

## 2019-01-02 ENCOUNTER — Other Ambulatory Visit: Payer: Self-pay

## 2019-01-02 ENCOUNTER — Encounter (HOSPITAL_COMMUNITY)
Admission: RE | Admit: 2019-01-02 | Discharge: 2019-01-02 | Disposition: A | Discharge status: Home / Self Care | Payer: Medicare HMO | Source: Ambulatory Visit | Attending: Cardiovascular Disease | Admitting: Cardiovascular Disease

## 2019-01-02 DIAGNOSIS — Z87442 Personal history of urinary calculi: Secondary | ICD-10-CM | POA: Diagnosis not present

## 2019-01-02 DIAGNOSIS — I2510 Atherosclerotic heart disease of native coronary artery without angina pectoris: Secondary | ICD-10-CM | POA: Diagnosis not present

## 2019-01-02 DIAGNOSIS — Z7982 Long term (current) use of aspirin: Secondary | ICD-10-CM | POA: Diagnosis not present

## 2019-01-02 DIAGNOSIS — E119 Type 2 diabetes mellitus without complications: Secondary | ICD-10-CM | POA: Diagnosis not present

## 2019-01-02 DIAGNOSIS — Z951 Presence of aortocoronary bypass graft: Secondary | ICD-10-CM | POA: Diagnosis not present

## 2019-01-02 DIAGNOSIS — Z7984 Long term (current) use of oral hypoglycemic drugs: Secondary | ICD-10-CM | POA: Diagnosis not present

## 2019-01-02 DIAGNOSIS — E7800 Pure hypercholesterolemia, unspecified: Secondary | ICD-10-CM | POA: Diagnosis not present

## 2019-01-02 DIAGNOSIS — Z79899 Other long term (current) drug therapy: Secondary | ICD-10-CM | POA: Diagnosis not present

## 2019-01-02 LAB — GLUCOSE, CAPILLARY
Glucose-Capillary: 193 mg/dL — ABNORMAL HIGH (ref 70–99)
Glucose-Capillary: 100 mg/dL — ABNORMAL HIGH (ref 70–99)

## 2019-01-03 NOTE — Progress Notes (Signed)
Cardiac Individual Treatment Plan  Patient Details  Name: Thomas Washington MRN: [id number] Date of Birth: 03/27/1951 Referring Provider:     CARDIAC REHAB PHASE II ORIENTATION from 12/25/2018 in [place]  Referring Provider  Dr. Cathie Olden      Initial Encounter Date:    CARDIAC REHAB PHASE II ORIENTATION from 12/25/2018 in [place]  Date  12/25/18      Visit Diagnosis: S/P CABG x 4 11/21/18  Patient's Home Medications on Admission:  Current Outpatient Medications:  .  ACCU-CHEK FASTCLIX LANCETS MISC, Use as instructed to test blood sugar daily, Disp: 100 each, Rfl: 2 .  acetaminophen (TYLENOL) 500 MG tablet, Take 2 tablets (1,000 mg total) by mouth every 6 (six) hours., Disp: 30 tablet, Rfl: 0 .  aspirin EC 325 MG EC tablet, Take 1 tablet (325 mg total) by mouth daily., Disp: 30 tablet, Rfl: 0 .  B Complex Vitamins (B COMPLEX PO), Take 1 tablet by mouth daily. , Disp: , Rfl:  .  Blood Glucose Monitoring Suppl (ACCU-CHEK AVIVA PLUS) w/Device KIT, Use as instructed to test blood sugar daily, Disp: 1 kit, Rfl: 0 .  calcium carbonate (TUMS - DOSED IN MG ELEMENTAL CALCIUM) 500 MG chewable tablet, Chew 2 tablets by mouth daily as needed for indigestion or heartburn., Disp: , Rfl:  .  Cholecalciferol (VITAMIN D3) 250 MCG (10000 UT) capsule, Take 10,000 Units by mouth at bedtime., Disp: , Rfl:  .  Cyanocobalamin (B-12) 5000 MCG CAPS, Take 5,000 mcg by mouth at bedtime., Disp: , Rfl:  .  folic acid (FOLVITE) 010 MCG tablet, Take 800 mcg by mouth daily., Disp: , Rfl:  .  glucose blood (ACCU-CHEK ACTIVE STRIPS) test strip, Use as instructed to test blood sugar daily, Disp: 100 each, Rfl: 2 .  JANUVIA 100 MG tablet, TAKE 1 TABLET BY MOUTH EVERY DAY (Patient taking differently: Take 100 mg by mouth every evening. ), Disp: 90 tablet, Rfl: 1 .  metFORMIN (GLUCOPHAGE) 1000 MG tablet, Take 0.5-1 tablets (500-1,000 mg total) by mouth See  admin instructions. Take 500 mg in the morning and 1000 mg in the evening, Disp: , Rfl:  .  metoprolol tartrate (LOPRESSOR) 25 MG tablet, Take 0.5 tablets (12.5 mg total) by mouth daily., Disp: 45 tablet, Rfl: 3 .  rosuvastatin (CRESTOR) 40 MG tablet, Take 1 tablet (40 mg total) by mouth daily. (Patient taking differently: Take 40 mg by mouth every evening. ), Disp: 30 tablet, Rfl: 11 .  tamsulosin (FLOMAX) 0.4 MG CAPS capsule, Take 1 capsule (0.4 mg total) by mouth daily after supper. Until stone passed (Patient taking differently: Take 0.4 mg by mouth daily. Until stone passed), Disp: 15 capsule, Rfl: 0 .  triamcinolone (NASACORT ALLERGY 24HR) 55 MCG/ACT AERO nasal inhaler, Place 1 spray into the nose daily as needed (allergies)., Disp: , Rfl:   Past Medical History: Past Medical History:  Diagnosis Date  . Anginal pain ([place])   . Coronary artery disease   . Diabetes mellitus without complication ([place])   . History of kidney stones   . Hypercholesteremia   . MRSA (methicillin resistant staph aureus) culture positive     Tobacco Use: Social History   Tobacco Use  Smoking Status Never Smoker  Smokeless Tobacco Never Used    Labs: Recent Review Flowsheet Data    Labs for ITP Cardiac and Pulmonary Rehab Latest Ref Rng & Units 11/21/2018 11/21/2018 11/21/2018 11/22/2018 11/22/2018   Cholestrol <200  mg/dL - - - - -   LDLCALC mg/dL (calc) - - - - -   HDL >40 mg/dL - - - - -   Trlycerides <150 mg/dL - - - - -   Hemoglobin A1c 4.8 - 5.6 % - - - - -   PHART 7.350 - 7.450 7.324(L) 7.327(L) - 7.308(L) -   PCO2ART 32.0 - 48.0 mmHg 42.3 43.3 - 44.2 -   HCO3 20.0 - 28.0 mmol/L 21.9 22.5 - 22.1 -   TCO2 22 - 32 mmol/L 23 24 21(L) 23 -   ACIDBASEDEF 0.0 - 2.0 mmol/L 4.0(H) 3.0(H) - 4.0(H) -   O2SAT % 98.0 99.0 - 97.0 75.1      Capillary Blood Glucose: Lab Results  Component Value Date   GLUCAP 100 (H) 01/02/2019   GLUCAP 193 (H) 01/02/2019   GLUCAP 123 (H) 12/31/2018   GLUCAP 89 12/31/2018    GLUCAP 211 (H) 12/31/2018     Exercise Target Goals: Exercise Program Goal: Individual exercise prescription set using results from initial 6 min walk test and THRR while considering  patient's activity barriers and safety.   Exercise Prescription Goal: Starting with aerobic activity 30 plus minutes a day, 3 days per week for initial exercise prescription. Provide home exercise prescription and guidelines that participant acknowledges understanding prior to discharge.  Activity Barriers & Risk Stratification: Activity Barriers & Cardiac Risk Stratification - 12/25/18 1112      Activity Barriers & Cardiac Risk Stratification   Activity Barriers  Other (comment)    Comments  Limited ROM in R Shoulder    Cardiac Risk Stratification  High       6 Minute Walk: 6 Minute Walk    Row Name 12/25/18 1111         6 Minute Walk   Phase  Initial     Distance  1960 feet     Walk Time  6 minutes     # of Rest Breaks  0     MPH  3.7     METS  4.11     RPE  12     Perceived Dyspnea   0     VO2 Peak  14.39     Symptoms  No     Resting HR  64 bpm     Resting BP  108/70     Resting Oxygen Saturation   97 %     Exercise Oxygen Saturation  during 6 min walk  100 %     Max Ex. HR  90 bpm     Max Ex. BP  120/70     2 Minute Post BP  110/70        Oxygen Initial Assessment:   Oxygen Re-Evaluation:   Oxygen Discharge (Final Oxygen Re-Evaluation):   Initial Exercise Prescription: Initial Exercise Prescription - 12/25/18 1100      Date of Initial Exercise RX and Referring Provider   Date  12/25/18    Referring Provider  Dr. Cathie Olden    Expected Discharge Date  02/08/19      Treadmill   MPH  3.8    Grade  1    Minutes  15      NuStep   Level  3    SPM  85    Minutes  15    METs  4      Prescription Details   Frequency (times per week)  3    Duration  Progress to 30 minutes  of continuous aerobic without signs/symptoms of physical distress      Intensity   THRR  40-80% of Max Heartrate  62-123    Ratings of Perceived Exertion  11-13      Progression   Progression  Continue to progress workloads to maintain intensity without signs/symptoms of physical distress.      Resistance Training   Training Prescription  Yes    Weight  4 lbs.     Reps  10-15       Perform Capillary Blood Glucose checks as needed.  Exercise Prescription Changes: Exercise Prescription Changes    Row Name 12/31/18 1000             Response to Exercise   Blood Pressure (Admit)  104/62       Blood Pressure (Exercise)  130/80       Blood Pressure (Exit)  118/78       Heart Rate (Admit)  68 bpm       Heart Rate (Exercise)  107 bpm       Heart Rate (Exit)  69 bpm       Rating of Perceived Exertion (Exercise)  11       Symptoms  none       Comments  blood sugar dropped to 89 after exe, lemonade given. Also, pt states he take metoprolol after exe.       Duration  Continue with 30 min of aerobic exercise without signs/symptoms of physical distress.       Intensity  THRR unchanged         Progression   Progression  Continue to progress workloads to maintain intensity without signs/symptoms of physical distress.       Average METs  3.3         Resistance Training   Training Prescription  Yes       Weight  4 lbs.        Reps  10-15       Time  10 Minutes         Interval Training   Interval Training  No         Treadmill   MPH  3.4       Grade  1       Minutes  15       METs  4.07         NuStep   Level  3       SPM  85       Minutes  15       METs  2.6         Home Exercise Plan   Plans to continue exercise at  Home (comment) Walking 2.5 miles daily.       Frequency  Add 4 additional days to program exercise sessions.          Exercise Comments: Exercise Comments    Row Name 12/31/18 1028           Exercise Comments  Patient off to a good start with exercise. Increase workloads as tolerated. Reviewed goals with patient.          Exercise  Goals and Review: Exercise Goals    Row Name 12/25/18 1116             Exercise Goals   Increase Physical Activity  Yes       Intervention  Provide advice, education, support and counseling about physical activity/exercise needs.;Develop an individualized exercise  prescription for aerobic and resistive training based on initial evaluation findings, risk stratification, comorbidities and participant's personal goals.       Expected Outcomes  Short Term: Attend rehab on a regular basis to increase amount of physical activity.;Long Term: Add in home exercise to make exercise part of routine and to increase amount of physical activity.;Long Term: Exercising regularly at least 3-5 days a week.       Increase Strength and Stamina  Yes       Intervention  Provide advice, education, support and counseling about physical activity/exercise needs.;Develop an individualized exercise prescription for aerobic and resistive training based on initial evaluation findings, risk stratification, comorbidities and participant's personal goals.       Expected Outcomes  Short Term: Increase workloads from initial exercise prescription for resistance, speed, and METs.;Short Term: Perform resistance training exercises routinely during rehab and add in resistance training at home;Long Term: Improve cardiorespiratory fitness, muscular endurance and strength as measured by increased METs and functional capacity (6MWT)       Able to understand and use rate of perceived exertion (RPE) scale  Yes       Intervention  Provide education and explanation on how to use RPE scale       Expected Outcomes  Short Term: Able to use RPE daily in rehab to express subjective intensity level;Long Term:  Able to use RPE to guide intensity level when exercising independently       Knowledge and understanding of Target Heart Rate Range (THRR)  Yes       Intervention  Provide education and explanation of THRR including how the numbers were  predicted and where they are located for reference       Expected Outcomes  Short Term: Able to state/look up THRR;Long Term: Able to use THRR to govern intensity when exercising independently;Short Term: Able to use daily as guideline for intensity in rehab       Able to check pulse independently  Yes       Intervention  Provide education and demonstration on how to check pulse in carotid and radial arteries.;Review the importance of being able to check your own pulse for safety during independent exercise       Expected Outcomes  Short Term: Able to explain why pulse checking is important during independent exercise;Long Term: Able to check pulse independently and accurately       Understanding of Exercise Prescription  Yes       Intervention  Provide education, explanation, and written materials on patient's individual exercise prescription       Expected Outcomes  Short Term: Able to explain program exercise prescription;Long Term: Able to explain home exercise prescription to exercise independently          Exercise Goals Re-Evaluation : Exercise Goals Re-Evaluation    Row Name 12/31/18 1026             Exercise Goal Re-Evaluation   Exercise Goals Review  Increase Physical Activity;Able to understand and use rate of perceived exertion (RPE) scale       Comments  Patient tolerated first session of exercise well without symptoms. Pt's CBG dropped to 89 after exercise, asymptomatic, but recovered to 123 with lemonade. Pt able to understand and use RPE scale appropriately. Pt is walking 2.5 miles daily as his mode of home exercise.       Expected Outcomes  Increase workloads as tolerated to help increase cardiorespiratory fitness.  Discharge Exercise Prescription (Final Exercise Prescription Changes): Exercise Prescription Changes - 12/31/18 1000      Response to Exercise   Blood Pressure (Admit)  104/62    Blood Pressure (Exercise)  130/80    Blood Pressure (Exit)  118/78     Heart Rate (Admit)  68 bpm    Heart Rate (Exercise)  107 bpm    Heart Rate (Exit)  69 bpm    Rating of Perceived Exertion (Exercise)  11    Symptoms  none    Comments  blood sugar dropped to 89 after exe, lemonade given. Also, pt states he take metoprolol after exe.    Duration  Continue with 30 min of aerobic exercise without signs/symptoms of physical distress.    Intensity  THRR unchanged      Progression   Progression  Continue to progress workloads to maintain intensity without signs/symptoms of physical distress.    Average METs  3.3      Resistance Training   Training Prescription  Yes    Weight  4 lbs.     Reps  10-15    Time  10 Minutes      Interval Training   Interval Training  No      Treadmill   MPH  3.4    Grade  1    Minutes  15    METs  4.07      NuStep   Level  3    SPM  85    Minutes  15    METs  2.6      Home Exercise Plan   Plans to continue exercise at  Home (comment)   Walking 2.5 miles daily.   Frequency  Add 4 additional days to program exercise sessions.       Nutrition:  Target Goals: Understanding of nutrition guidelines, daily intake of sodium <1512m, cholesterol <2012m calories 30% from fat and 7% or less from saturated fats, daily to have 5 or more servings of fruits and vegetables.  Biometrics: Pre Biometrics - 12/25/18 1117      Pre Biometrics   Height  _0  (1.727 m)    Weight  82.2 kg    Waist Circumference  38.5 inches    Hip Circumference  37 inches    Waist to Hip Ratio  1.04 %    BMI (Calculated)  27.56    Triceps Skinfold  28 mm    % Body Fat  29.3 %    Grip Strength  37 kg    Flexibility  0 in    Single Leg Stand  25.75 seconds        Nutrition Therapy Plan and Nutrition Goals:   Nutrition Assessments:   Nutrition Goals Re-Evaluation:   Nutrition Goals Discharge (Final Nutrition Goals Re-Evaluation):   Psychosocial: Target Goals: Acknowledge presence or absence of significant depression and/or  stress, maximize coping skills, provide positive support system. Participant is able to verbalize types and ability to use techniques and skills needed for reducing stress and depression.  Initial Review & Psychosocial Screening: Initial Psych Review & Screening - 12/25/18 1144      Initial Review   Current issues with  None Identified      Family Dynamics   Good Support System?  Yes   JiClair Gullingas his wife for support     Barriers   Psychosocial barriers to participate in program  There are no identifiable barriers or psychosocial needs.  Screening Interventions   Interventions  Encouraged to exercise       Quality of Life Scores: Quality of Life - 12/25/18 1116      Quality of Life   Select  Quality of Life      Quality of Life Scores   Health/Function Pre  22.4 %    Socioeconomic Pre  22.79 %    Psych/Spiritual Pre  22.64 %    Family Pre  27.6 %    GLOBAL Pre  23.29 %      Scores of 19 and below usually indicate a poorer quality of life in these areas.  A difference of  2-3 points is a clinically meaningful difference.  A difference of 2-3 points in the total score of the Quality of Life Index has been associated with significant improvement in overall quality of life, self-image, physical symptoms, and general health in studies assessing change in quality of life.  PHQ-9: Recent Review Flowsheet Data    Depression screen [facility] 2/9 12/25/2018 07/17/2018 06/22/2017 11/03/2014   Decreased Interest 0 0 0 0   Down, Depressed, Hopeless 0 0 0 0   PHQ - 2 Score 0 0 0 0     Interpretation of Total Score  Total Score Depression Severity:  1-4 = Minimal depression, 5-9 = Mild depression, 10-14 = Moderate depression, 15-19 = Moderately severe depression, 20-27 = Severe depression   Psychosocial Evaluation and Intervention:   Psychosocial Re-Evaluation: Psychosocial Re-Evaluation    [place] Name 01/03/19 1451             Psychosocial Re-Evaluation   Current issues with  None  Identified       Interventions  Encouraged to attend Cardiac Rehabilitation for the exercise       Continue Psychosocial Services   No Follow up required          Psychosocial Discharge (Final Psychosocial Re-Evaluation): Psychosocial Re-Evaluation - 01/03/19 1451      Psychosocial Re-Evaluation   Current issues with  None Identified    Interventions  Encouraged to attend Cardiac Rehabilitation for the exercise    Continue Psychosocial Services   No Follow up required       Vocational Rehabilitation: Provide vocational rehab assistance to qualifying candidates.   Vocational Rehab Evaluation & Intervention: Vocational Rehab - 12/25/18 1145      Initial Vocational Rehab Evaluation & Intervention   Assessment shows need for Vocational Rehabilitation  No       Education: Education Goals: Education classes will be provided on a weekly basis, covering required topics. Participant will state understanding/return demonstration of topics presented.  Learning Barriers/Preferences: Learning Barriers/Preferences - 12/25/18 1118      Learning Barriers/Preferences   Learning Barriers  None    Learning Preferences  Written Material       Education Topics: Hypertension, Hypertension Reduction -Define heart disease and high blood pressure. Discus how high blood pressure affects the body and ways to reduce high blood pressure.   Exercise and Your Heart -Discuss why it is important to exercise, the FITT principles of exercise, normal and abnormal responses to exercise, and how to exercise safely.   Angina -Discuss definition of angina, causes of angina, treatment of angina, and how to decrease risk of having angina.   Cardiac Medications -Review what the following cardiac medications are used for, how they affect the body, and side effects that may occur when taking the medications.  Medications include Aspirin, Beta blockers, calcium channel blockers, ACE Inhibitors,  angiotensin  receptor blockers, diuretics, digoxin, and antihyperlipidemics.   Congestive Heart Failure -Discuss the definition of CHF, how to live with CHF, the signs and symptoms of CHF, and how keep track of weight and sodium intake.   Heart Disease and Intimacy -Discus the effect sexual activity has on the heart, how changes occur during intimacy as we age, and safety during sexual activity.   Smoking Cessation / COPD -Discuss different methods to quit smoking, the health benefits of quitting smoking, and the definition of COPD.   Nutrition I: Fats -Discuss the types of cholesterol, what cholesterol does to the heart, and how cholesterol levels can be controlled.   Nutrition II: Labels -Discuss the different components of food labels and how to read food label   Heart Parts/Heart Disease and PAD -Discuss the anatomy of the heart, the pathway of blood circulation through the heart, and these are affected by heart disease.   Stress I: Signs and Symptoms -Discuss the causes of stress, how stress may lead to anxiety and depression, and ways to limit stress.   Stress II: Relaxation -Discuss different types of relaxation techniques to limit stress.   Warning Signs of Stroke / TIA -Discuss definition of a stroke, what the signs and symptoms are of a stroke, and how to identify when someone is having stroke.   Knowledge Questionnaire Score: Knowledge Questionnaire Score - 12/25/18 1118      Knowledge Questionnaire Score   Pre Score  24/24       Core Components/Risk Factors/Patient Goals at Admission: Personal Goals and Risk Factors at Admission - 12/25/18 1118      Core Components/Risk Factors/Patient Goals on Admission    Weight Management  Yes;Weight Maintenance;Weight Loss    Admit Weight  181 lb 3.5 oz (82.2 kg)    Diabetes  Yes    Intervention  Provide education about signs/symptoms and action to take for hypo/hyperglycemia.;Provide education about proper nutrition,  including hydration, and aerobic/resistive exercise prescription along with prescribed medications to achieve blood glucose in normal ranges: Fasting glucose 65-99 mg/dL    Expected Outcomes  Short Term: Participant verbalizes understanding of the signs/symptoms and immediate care of hyper/hypoglycemia, proper foot care and importance of medication, aerobic/resistive exercise and nutrition plan for blood glucose control.;Long Term: Attainment of HbA1C < 7%.    Hypertension  Yes    Intervention  Provide education on lifestyle modifcations including regular physical activity/exercise, weight management, moderate sodium restriction and increased consumption of fresh fruit, vegetables, and low fat dairy, alcohol moderation, and smoking cessation.;Monitor prescription use compliance.    Expected Outcomes  Short Term: Continued assessment and intervention until BP is < 140/75m HG in hypertensive participants. < 130/875mHG in hypertensive participants with diabetes, heart failure or chronic kidney disease.;Long Term: Maintenance of blood pressure at goal levels.    Lipids  Yes    Intervention  Provide education and support for participant on nutrition & aerobic/resistive exercise along with prescribed medications to achieve LDL <7075mHDL >32m14m  Expected Outcomes  Short Term: Participant states understanding of desired cholesterol values and is compliant with medications prescribed. Participant is following exercise prescription and nutrition guidelines.;Long Term: Cholesterol controlled with medications as prescribed, with individualized exercise RX and with personalized nutrition plan. Value goals: LDL < 70mg41mL > 40 mg.    Stress  Yes    Intervention  Offer individual and/or small group education and counseling on adjustment to heart disease, stress management and health-related lifestyle change. Teach and  support self-help strategies.;Refer participants experiencing significant psychosocial distress to  appropriate mental health specialists for further evaluation and treatment. When possible, include family members and significant others in education/counseling sessions.       Core Components/Risk Factors/Patient Goals Review:  Goals and Risk Factor Review    Row Name 01/03/19 1452             Core Components/Risk Factors/Patient Goals Review   Personal Goals Review  Weight Management/Obesity;Diabetes;Hypertension;Lipids       Review  Clair Gulling is off to a good start to exercise. Jim's vital signs and CBG's ahve been stable.       Expected Outcomes  Patient will continue to participate in phase 2 cardiac rehab for exercise, nutrition and lifestyle modifications          Core Components/Risk Factors/Patient Goals at Discharge (Final Review):  Goals and Risk Factor Review - 01/03/19 1452      Core Components/Risk Factors/Patient Goals Review   Personal Goals Review  Weight Management/Obesity;Diabetes;Hypertension;Lipids    Review  Clair Gulling is off to a good start to exercise. Jim's vital signs and CBG's ahve been stable.    Expected Outcomes  Patient will continue to participate in phase 2 cardiac rehab for exercise, nutrition and lifestyle modifications       ITP Comments: ITP Comments    Row Name 12/25/18 1039 01/03/19 1450         ITP Comments  Dr. Fransico Him, Medical Director  30 Day ITP Review. Clair Gulling is off to a good start to exercise.         Comments: See ITP comments.Barnet Pall, RN,BSN 01/03/2019 2:57 PM

## 2019-01-04 ENCOUNTER — Other Ambulatory Visit: Payer: Self-pay

## 2019-01-04 ENCOUNTER — Encounter (HOSPITAL_COMMUNITY)
Admission: RE | Admit: 2019-01-04 | Discharge: 2019-01-04 | Disposition: A | Discharge status: Home / Self Care | Payer: Medicare HMO | Source: Ambulatory Visit | Attending: Cardiovascular Disease | Admitting: Cardiovascular Disease

## 2019-01-04 DIAGNOSIS — E119 Type 2 diabetes mellitus without complications: Secondary | ICD-10-CM | POA: Diagnosis not present

## 2019-01-04 DIAGNOSIS — I2510 Atherosclerotic heart disease of native coronary artery without angina pectoris: Secondary | ICD-10-CM | POA: Diagnosis not present

## 2019-01-04 DIAGNOSIS — Z79899 Other long term (current) drug therapy: Secondary | ICD-10-CM | POA: Diagnosis not present

## 2019-01-04 DIAGNOSIS — E7800 Pure hypercholesterolemia, unspecified: Secondary | ICD-10-CM | POA: Diagnosis not present

## 2019-01-04 DIAGNOSIS — Z951 Presence of aortocoronary bypass graft: Secondary | ICD-10-CM | POA: Diagnosis not present

## 2019-01-04 DIAGNOSIS — Z87442 Personal history of urinary calculi: Secondary | ICD-10-CM | POA: Diagnosis not present

## 2019-01-04 DIAGNOSIS — Z7984 Long term (current) use of oral hypoglycemic drugs: Secondary | ICD-10-CM | POA: Diagnosis not present

## 2019-01-04 DIAGNOSIS — Z7982 Long term (current) use of aspirin: Secondary | ICD-10-CM | POA: Diagnosis not present

## 2019-01-04 NOTE — Progress Notes (Signed)
Patient given handouts on hypo/hyperglycemia, balancing activity with food and nutrition making lifestyle changes.Barnet Pall, RN,BSN 01/04/2019 10:45 AM

## 2019-01-07 ENCOUNTER — Other Ambulatory Visit: Payer: Medicare HMO | Admitting: *Deleted

## 2019-01-07 ENCOUNTER — Encounter (HOSPITAL_COMMUNITY)
Admission: RE | Admit: 2019-01-07 | Discharge: 2019-01-07 | Disposition: A | Discharge status: Home / Self Care | Payer: Medicare HMO | Source: Ambulatory Visit | Attending: Cardiovascular Disease | Admitting: Cardiovascular Disease

## 2019-01-07 ENCOUNTER — Other Ambulatory Visit: Payer: Self-pay

## 2019-01-07 DIAGNOSIS — Z7984 Long term (current) use of oral hypoglycemic drugs: Secondary | ICD-10-CM | POA: Diagnosis not present

## 2019-01-07 DIAGNOSIS — Z79899 Other long term (current) drug therapy: Secondary | ICD-10-CM | POA: Diagnosis not present

## 2019-01-07 DIAGNOSIS — Z951 Presence of aortocoronary bypass graft: Secondary | ICD-10-CM | POA: Diagnosis not present

## 2019-01-07 DIAGNOSIS — E119 Type 2 diabetes mellitus without complications: Secondary | ICD-10-CM | POA: Diagnosis not present

## 2019-01-07 DIAGNOSIS — E7800 Pure hypercholesterolemia, unspecified: Secondary | ICD-10-CM | POA: Diagnosis not present

## 2019-01-07 DIAGNOSIS — Z87442 Personal history of urinary calculi: Secondary | ICD-10-CM | POA: Diagnosis not present

## 2019-01-07 DIAGNOSIS — I2510 Atherosclerotic heart disease of native coronary artery without angina pectoris: Secondary | ICD-10-CM | POA: Diagnosis not present

## 2019-01-07 DIAGNOSIS — E785 Hyperlipidemia, unspecified: Secondary | ICD-10-CM | POA: Diagnosis not present

## 2019-01-07 DIAGNOSIS — Z7982 Long term (current) use of aspirin: Secondary | ICD-10-CM | POA: Diagnosis not present

## 2019-01-07 LAB — LIPID PANEL
Chol/HDL Ratio: 2.7 ratio (ref 0.0–5.0)
Cholesterol, Total: 129 mg/dL (ref 100–199)
HDL: 48 mg/dL (ref >39)
LDL Calculated: 63 mg/dL (ref 0–99)
Triglycerides: 88 mg/dL (ref 0–149)
VLDL Cholesterol Cal: 18 mg/dL (ref 5–40)

## 2019-01-07 LAB — HEPATIC FUNCTION PANEL
ALT: 16 IU/L (ref 0–44)
AST: 19 IU/L (ref 0–40)
Albumin: 4.5 g/dL (ref 3.8–4.8)
Alkaline Phosphatase: 56 IU/L (ref 39–117)
Bilirubin Total: 0.3 mg/dL (ref 0.0–1.2)
Bilirubin, Direct: 0.09 mg/dL (ref 0.00–0.40)
Total Protein: 7.4 g/dL (ref 6.0–8.5)

## 2019-01-07 NOTE — Progress Notes (Signed)
Post exercise CBG 85 patient asymptomatic. Patient was given lemonade. Clair Gulling had an egg and a fruit cup in the car are he fasted this morning for lab work. Patient reminded to add a carbohydrate choice  to his breakfast prior to cardiac rehab.Repeat CBG [address], RN,BSN 01/07/2019 10:18 AM

## 2019-01-07 NOTE — Progress Notes (Signed)
I have reviewed a Home Exercise Prescription with Thomas Washington . Thomas Washington is currently exercising at home by walking 2.5 miles 4 days per week.  The patient was advised to walk 2-4 days a week for 30-45 minutes.  Thomas Washington and I discussed how to progress their exercise prescription.  The patient stated that their goals were to add weight while walking.  The patient stated that they understand the exercise prescription.  We reviewed exercise guidelines, target heart rate during exercise, RPE Scale, weather conditions, NTG use, endpoints for exercise, warmup and cool down.  Patient is encouraged to come to me with any questions. I will continue to follow up with the patient to assist them with progression and safety.    01/07/2019 10:26 AM  Thomas Washington BS, ACSM CEP

## 2019-01-09 ENCOUNTER — Encounter (HOSPITAL_COMMUNITY)
Admission: RE | Admit: 2019-01-09 | Discharge: 2019-01-09 | Disposition: A | Discharge status: Home / Self Care | Payer: Medicare HMO | Source: Ambulatory Visit | Attending: Cardiovascular Disease | Admitting: Cardiovascular Disease

## 2019-01-09 ENCOUNTER — Other Ambulatory Visit: Payer: Self-pay

## 2019-01-09 ENCOUNTER — Other Ambulatory Visit: Payer: Self-pay | Admitting: Physician Assistant

## 2019-01-09 DIAGNOSIS — Z951 Presence of aortocoronary bypass graft: Secondary | ICD-10-CM

## 2019-01-09 DIAGNOSIS — E119 Type 2 diabetes mellitus without complications: Secondary | ICD-10-CM | POA: Diagnosis not present

## 2019-01-09 DIAGNOSIS — I2510 Atherosclerotic heart disease of native coronary artery without angina pectoris: Secondary | ICD-10-CM | POA: Diagnosis not present

## 2019-01-09 DIAGNOSIS — Z7984 Long term (current) use of oral hypoglycemic drugs: Secondary | ICD-10-CM | POA: Diagnosis not present

## 2019-01-09 DIAGNOSIS — Z87442 Personal history of urinary calculi: Secondary | ICD-10-CM | POA: Diagnosis not present

## 2019-01-09 DIAGNOSIS — Z7982 Long term (current) use of aspirin: Secondary | ICD-10-CM | POA: Diagnosis not present

## 2019-01-09 DIAGNOSIS — Z79899 Other long term (current) drug therapy: Secondary | ICD-10-CM | POA: Diagnosis not present

## 2019-01-09 DIAGNOSIS — E7800 Pure hypercholesterolemia, unspecified: Secondary | ICD-10-CM | POA: Diagnosis not present

## 2019-01-11 ENCOUNTER — Encounter (HOSPITAL_COMMUNITY)
Admission: RE | Admit: 2019-01-11 | Discharge: 2019-01-11 | Disposition: A | Discharge status: Home / Self Care | Payer: Medicare HMO | Source: Ambulatory Visit | Attending: Cardiovascular Disease | Admitting: Cardiovascular Disease

## 2019-01-11 ENCOUNTER — Other Ambulatory Visit: Payer: Self-pay

## 2019-01-11 DIAGNOSIS — Z951 Presence of aortocoronary bypass graft: Secondary | ICD-10-CM

## 2019-01-11 DIAGNOSIS — Z7984 Long term (current) use of oral hypoglycemic drugs: Secondary | ICD-10-CM | POA: Diagnosis not present

## 2019-01-11 DIAGNOSIS — I2510 Atherosclerotic heart disease of native coronary artery without angina pectoris: Secondary | ICD-10-CM | POA: Diagnosis not present

## 2019-01-11 DIAGNOSIS — Z7982 Long term (current) use of aspirin: Secondary | ICD-10-CM | POA: Diagnosis not present

## 2019-01-11 DIAGNOSIS — E119 Type 2 diabetes mellitus without complications: Secondary | ICD-10-CM | POA: Diagnosis not present

## 2019-01-11 DIAGNOSIS — Z79899 Other long term (current) drug therapy: Secondary | ICD-10-CM | POA: Diagnosis not present

## 2019-01-11 DIAGNOSIS — Z87442 Personal history of urinary calculi: Secondary | ICD-10-CM | POA: Diagnosis not present

## 2019-01-11 DIAGNOSIS — E7800 Pure hypercholesterolemia, unspecified: Secondary | ICD-10-CM | POA: Diagnosis not present

## 2019-01-14 ENCOUNTER — Other Ambulatory Visit: Payer: Self-pay

## 2019-01-14 ENCOUNTER — Encounter (HOSPITAL_COMMUNITY)
Admission: RE | Admit: 2019-01-14 | Discharge: 2019-01-14 | Disposition: A | Discharge status: Home / Self Care | Payer: Medicare HMO | Source: Ambulatory Visit | Attending: Cardiovascular Disease | Admitting: Cardiovascular Disease

## 2019-01-14 DIAGNOSIS — Z951 Presence of aortocoronary bypass graft: Secondary | ICD-10-CM | POA: Diagnosis not present

## 2019-01-14 DIAGNOSIS — Z79899 Other long term (current) drug therapy: Secondary | ICD-10-CM | POA: Insufficient documentation

## 2019-01-14 DIAGNOSIS — Z7982 Long term (current) use of aspirin: Secondary | ICD-10-CM | POA: Insufficient documentation

## 2019-01-14 DIAGNOSIS — Z87442 Personal history of urinary calculi: Secondary | ICD-10-CM | POA: Insufficient documentation

## 2019-01-14 DIAGNOSIS — Z7984 Long term (current) use of oral hypoglycemic drugs: Secondary | ICD-10-CM | POA: Diagnosis not present

## 2019-01-14 DIAGNOSIS — E7800 Pure hypercholesterolemia, unspecified: Secondary | ICD-10-CM | POA: Diagnosis not present

## 2019-01-14 DIAGNOSIS — E119 Type 2 diabetes mellitus without complications: Secondary | ICD-10-CM | POA: Insufficient documentation

## 2019-01-14 DIAGNOSIS — I2510 Atherosclerotic heart disease of native coronary artery without angina pectoris: Secondary | ICD-10-CM | POA: Diagnosis not present

## 2019-01-16 ENCOUNTER — Other Ambulatory Visit: Payer: Self-pay

## 2019-01-16 ENCOUNTER — Encounter (HOSPITAL_COMMUNITY)
Admission: RE | Admit: 2019-01-16 | Discharge: 2019-01-16 | Disposition: A | Discharge status: Home / Self Care | Payer: Medicare HMO | Source: Ambulatory Visit | Attending: Cardiovascular Disease | Admitting: Cardiovascular Disease

## 2019-01-16 DIAGNOSIS — Z951 Presence of aortocoronary bypass graft: Secondary | ICD-10-CM

## 2019-01-16 DIAGNOSIS — I2510 Atherosclerotic heart disease of native coronary artery without angina pectoris: Secondary | ICD-10-CM | POA: Diagnosis not present

## 2019-01-16 DIAGNOSIS — E119 Type 2 diabetes mellitus without complications: Secondary | ICD-10-CM | POA: Diagnosis not present

## 2019-01-16 DIAGNOSIS — E7800 Pure hypercholesterolemia, unspecified: Secondary | ICD-10-CM | POA: Diagnosis not present

## 2019-01-16 DIAGNOSIS — Z7982 Long term (current) use of aspirin: Secondary | ICD-10-CM | POA: Diagnosis not present

## 2019-01-16 DIAGNOSIS — Z79899 Other long term (current) drug therapy: Secondary | ICD-10-CM | POA: Diagnosis not present

## 2019-01-16 DIAGNOSIS — Z7984 Long term (current) use of oral hypoglycemic drugs: Secondary | ICD-10-CM | POA: Diagnosis not present

## 2019-01-16 DIAGNOSIS — Z87442 Personal history of urinary calculi: Secondary | ICD-10-CM | POA: Diagnosis not present

## 2019-01-18 ENCOUNTER — Other Ambulatory Visit: Payer: Self-pay

## 2019-01-18 ENCOUNTER — Encounter (HOSPITAL_COMMUNITY)
Admission: RE | Admit: 2019-01-18 | Discharge: 2019-01-18 | Disposition: A | Discharge status: Home / Self Care | Payer: Medicare HMO | Source: Ambulatory Visit | Attending: Cardiovascular Disease | Admitting: Cardiovascular Disease

## 2019-01-18 DIAGNOSIS — E7800 Pure hypercholesterolemia, unspecified: Secondary | ICD-10-CM | POA: Diagnosis not present

## 2019-01-18 DIAGNOSIS — Z87442 Personal history of urinary calculi: Secondary | ICD-10-CM | POA: Diagnosis not present

## 2019-01-18 DIAGNOSIS — Z951 Presence of aortocoronary bypass graft: Secondary | ICD-10-CM | POA: Diagnosis not present

## 2019-01-18 DIAGNOSIS — E119 Type 2 diabetes mellitus without complications: Secondary | ICD-10-CM | POA: Diagnosis not present

## 2019-01-18 DIAGNOSIS — Z79899 Other long term (current) drug therapy: Secondary | ICD-10-CM | POA: Diagnosis not present

## 2019-01-18 DIAGNOSIS — Z7982 Long term (current) use of aspirin: Secondary | ICD-10-CM | POA: Diagnosis not present

## 2019-01-18 DIAGNOSIS — I2510 Atherosclerotic heart disease of native coronary artery without angina pectoris: Secondary | ICD-10-CM | POA: Diagnosis not present

## 2019-01-18 DIAGNOSIS — Z7984 Long term (current) use of oral hypoglycemic drugs: Secondary | ICD-10-CM | POA: Diagnosis not present

## 2019-01-21 ENCOUNTER — Encounter (HOSPITAL_COMMUNITY)
Admission: RE | Admit: 2019-01-21 | Discharge: 2019-01-21 | Disposition: A | Discharge status: Home / Self Care | Payer: Medicare HMO | Source: Ambulatory Visit | Attending: Cardiovascular Disease | Admitting: Cardiovascular Disease

## 2019-01-21 ENCOUNTER — Other Ambulatory Visit: Payer: Self-pay

## 2019-01-21 DIAGNOSIS — I2510 Atherosclerotic heart disease of native coronary artery without angina pectoris: Secondary | ICD-10-CM | POA: Diagnosis not present

## 2019-01-21 DIAGNOSIS — E7800 Pure hypercholesterolemia, unspecified: Secondary | ICD-10-CM | POA: Diagnosis not present

## 2019-01-21 DIAGNOSIS — Z7982 Long term (current) use of aspirin: Secondary | ICD-10-CM | POA: Diagnosis not present

## 2019-01-21 DIAGNOSIS — Z951 Presence of aortocoronary bypass graft: Secondary | ICD-10-CM | POA: Diagnosis not present

## 2019-01-21 DIAGNOSIS — Z79899 Other long term (current) drug therapy: Secondary | ICD-10-CM | POA: Diagnosis not present

## 2019-01-21 DIAGNOSIS — Z7984 Long term (current) use of oral hypoglycemic drugs: Secondary | ICD-10-CM | POA: Diagnosis not present

## 2019-01-21 DIAGNOSIS — Z87442 Personal history of urinary calculi: Secondary | ICD-10-CM | POA: Diagnosis not present

## 2019-01-21 DIAGNOSIS — E119 Type 2 diabetes mellitus without complications: Secondary | ICD-10-CM | POA: Diagnosis not present

## 2019-01-22 ENCOUNTER — Other Ambulatory Visit: Payer: Self-pay | Admitting: Internal Medicine

## 2019-01-22 DIAGNOSIS — R69 Illness, unspecified: Secondary | ICD-10-CM | POA: Diagnosis not present

## 2019-01-23 ENCOUNTER — Other Ambulatory Visit: Payer: Self-pay

## 2019-01-23 ENCOUNTER — Encounter (HOSPITAL_COMMUNITY)
Admission: RE | Admit: 2019-01-23 | Discharge: 2019-01-23 | Disposition: A | Discharge status: Home / Self Care | Payer: Medicare HMO | Source: Ambulatory Visit | Attending: Cardiovascular Disease | Admitting: Cardiovascular Disease

## 2019-01-23 DIAGNOSIS — Z951 Presence of aortocoronary bypass graft: Secondary | ICD-10-CM

## 2019-01-23 DIAGNOSIS — I2510 Atherosclerotic heart disease of native coronary artery without angina pectoris: Secondary | ICD-10-CM | POA: Diagnosis not present

## 2019-01-23 DIAGNOSIS — E119 Type 2 diabetes mellitus without complications: Secondary | ICD-10-CM | POA: Diagnosis not present

## 2019-01-23 DIAGNOSIS — Z87442 Personal history of urinary calculi: Secondary | ICD-10-CM | POA: Diagnosis not present

## 2019-01-23 DIAGNOSIS — E7800 Pure hypercholesterolemia, unspecified: Secondary | ICD-10-CM | POA: Diagnosis not present

## 2019-01-23 DIAGNOSIS — Z7984 Long term (current) use of oral hypoglycemic drugs: Secondary | ICD-10-CM | POA: Diagnosis not present

## 2019-01-23 DIAGNOSIS — Z7982 Long term (current) use of aspirin: Secondary | ICD-10-CM | POA: Diagnosis not present

## 2019-01-23 DIAGNOSIS — Z79899 Other long term (current) drug therapy: Secondary | ICD-10-CM | POA: Diagnosis not present

## 2019-01-25 ENCOUNTER — Encounter (HOSPITAL_COMMUNITY)
Admission: RE | Admit: 2019-01-25 | Discharge: 2019-01-25 | Disposition: A | Discharge status: Home / Self Care | Payer: Medicare HMO | Source: Ambulatory Visit | Attending: Cardiovascular Disease | Admitting: Cardiovascular Disease

## 2019-01-25 ENCOUNTER — Other Ambulatory Visit: Payer: Self-pay

## 2019-01-25 DIAGNOSIS — Z951 Presence of aortocoronary bypass graft: Secondary | ICD-10-CM

## 2019-01-25 DIAGNOSIS — E7800 Pure hypercholesterolemia, unspecified: Secondary | ICD-10-CM | POA: Diagnosis not present

## 2019-01-25 DIAGNOSIS — I2510 Atherosclerotic heart disease of native coronary artery without angina pectoris: Secondary | ICD-10-CM | POA: Diagnosis not present

## 2019-01-25 DIAGNOSIS — E119 Type 2 diabetes mellitus without complications: Secondary | ICD-10-CM | POA: Diagnosis not present

## 2019-01-25 DIAGNOSIS — Z87442 Personal history of urinary calculi: Secondary | ICD-10-CM | POA: Diagnosis not present

## 2019-01-25 DIAGNOSIS — Z7982 Long term (current) use of aspirin: Secondary | ICD-10-CM | POA: Diagnosis not present

## 2019-01-25 DIAGNOSIS — Z79899 Other long term (current) drug therapy: Secondary | ICD-10-CM | POA: Diagnosis not present

## 2019-01-25 DIAGNOSIS — Z7984 Long term (current) use of oral hypoglycemic drugs: Secondary | ICD-10-CM | POA: Diagnosis not present

## 2019-01-28 ENCOUNTER — Other Ambulatory Visit: Payer: Self-pay

## 2019-01-28 ENCOUNTER — Encounter (HOSPITAL_COMMUNITY)
Admission: RE | Admit: 2019-01-28 | Discharge: 2019-01-28 | Disposition: A | Discharge status: Home / Self Care | Payer: Medicare HMO | Source: Ambulatory Visit | Attending: Cardiovascular Disease | Admitting: Cardiovascular Disease

## 2019-01-28 DIAGNOSIS — E119 Type 2 diabetes mellitus without complications: Secondary | ICD-10-CM | POA: Diagnosis not present

## 2019-01-28 DIAGNOSIS — Z7982 Long term (current) use of aspirin: Secondary | ICD-10-CM | POA: Diagnosis not present

## 2019-01-28 DIAGNOSIS — Z7984 Long term (current) use of oral hypoglycemic drugs: Secondary | ICD-10-CM | POA: Diagnosis not present

## 2019-01-28 DIAGNOSIS — Z951 Presence of aortocoronary bypass graft: Secondary | ICD-10-CM

## 2019-01-28 DIAGNOSIS — Z79899 Other long term (current) drug therapy: Secondary | ICD-10-CM | POA: Diagnosis not present

## 2019-01-28 DIAGNOSIS — Z87442 Personal history of urinary calculi: Secondary | ICD-10-CM | POA: Diagnosis not present

## 2019-01-28 DIAGNOSIS — I2510 Atherosclerotic heart disease of native coronary artery without angina pectoris: Secondary | ICD-10-CM | POA: Diagnosis not present

## 2019-01-28 DIAGNOSIS — E7800 Pure hypercholesterolemia, unspecified: Secondary | ICD-10-CM | POA: Diagnosis not present

## 2019-01-30 ENCOUNTER — Encounter (HOSPITAL_COMMUNITY)
Admission: RE | Admit: 2019-01-30 | Discharge: 2019-01-30 | Disposition: A | Discharge status: Home / Self Care | Payer: Medicare HMO | Source: Ambulatory Visit | Attending: Cardiovascular Disease | Admitting: Cardiovascular Disease

## 2019-01-30 ENCOUNTER — Other Ambulatory Visit: Payer: Self-pay

## 2019-01-30 DIAGNOSIS — Z7982 Long term (current) use of aspirin: Secondary | ICD-10-CM | POA: Diagnosis not present

## 2019-01-30 DIAGNOSIS — Z951 Presence of aortocoronary bypass graft: Secondary | ICD-10-CM | POA: Diagnosis not present

## 2019-01-30 DIAGNOSIS — I2510 Atherosclerotic heart disease of native coronary artery without angina pectoris: Secondary | ICD-10-CM | POA: Diagnosis not present

## 2019-01-30 DIAGNOSIS — E119 Type 2 diabetes mellitus without complications: Secondary | ICD-10-CM | POA: Diagnosis not present

## 2019-01-30 DIAGNOSIS — Z79899 Other long term (current) drug therapy: Secondary | ICD-10-CM | POA: Diagnosis not present

## 2019-01-30 DIAGNOSIS — Z87442 Personal history of urinary calculi: Secondary | ICD-10-CM | POA: Diagnosis not present

## 2019-01-30 DIAGNOSIS — E7800 Pure hypercholesterolemia, unspecified: Secondary | ICD-10-CM | POA: Diagnosis not present

## 2019-01-30 DIAGNOSIS — Z7984 Long term (current) use of oral hypoglycemic drugs: Secondary | ICD-10-CM | POA: Diagnosis not present

## 2019-01-31 NOTE — Progress Notes (Signed)
Cardiac Individual Treatment Plan  Patient Details  Name: Thomas Washington MRN: [id number] Date of Birth: 11/10/1950 Referring Provider:     CARDIAC REHAB PHASE II ORIENTATION from 12/25/2018 in [facility]  Referring Provider  Dr. Nasher      Initial Encounter Date:    CARDIAC REHAB PHASE II ORIENTATION from 12/25/2018 in [facility]  Date  12/25/18      Visit Diagnosis: S/P CABG x 4 11/21/18  Patient's Home Medications on Admission:  Current Outpatient Medications:  .  ACCU-CHEK FASTCLIX LANCETS MISC, Use as instructed to test blood sugar daily, Disp: 100 each, Rfl: 2 .  acetaminophen (TYLENOL) 500 MG tablet, Take 2 tablets (1,000 mg total) by mouth every 6 (six) hours., Disp: 30 tablet, Rfl: 0 .  aspirin EC 325 MG EC tablet, Take 1 tablet (325 mg total) by mouth daily., Disp: 30 tablet, Rfl: 0 .  B Complex Vitamins (B COMPLEX PO), Take 1 tablet by mouth daily. , Disp: , Rfl:  .  Blood Glucose Monitoring Suppl (ACCU-CHEK AVIVA PLUS) w/Device KIT, Use as instructed to test blood sugar daily, Disp: 1 kit, Rfl: 0 .  calcium carbonate (TUMS - DOSED IN MG ELEMENTAL CALCIUM) 500 MG chewable tablet, Chew 2 tablets by mouth daily as needed for indigestion or heartburn., Disp: , Rfl:  .  Cholecalciferol (VITAMIN D3) 250 MCG (10000 UT) capsule, Take 10,000 Units by mouth at bedtime., Disp: , Rfl:  .  Cyanocobalamin (B-12) 5000 MCG CAPS, Take 5,000 mcg by mouth at bedtime., Disp: , Rfl:  .  folic acid (FOLVITE) 800 MCG tablet, Take 800 mcg by mouth daily., Disp: , Rfl:  .  glucose blood (ACCU-CHEK ACTIVE STRIPS) test strip, Use as instructed to test blood sugar daily, Disp: 100 each, Rfl: 2 .  JANUVIA 100 MG tablet, TAKE 1 TABLET BY MOUTH EVERY DAY, Disp: 90 tablet, Rfl: 3 .  metFORMIN (GLUCOPHAGE) 1000 MG tablet, Take 0.5-1 tablets (500-1,000 mg total) by mouth See admin instructions. Take 500 mg in the morning and 1000 mg in the  evening, Disp: , Rfl:  .  metoprolol tartrate (LOPRESSOR) 25 MG tablet, Take 0.5 tablets (12.5 mg total) by mouth daily., Disp: 45 tablet, Rfl: 3 .  rosuvastatin (CRESTOR) 40 MG tablet, Take 1 tablet (40 mg total) by mouth daily. (Patient taking differently: Take 40 mg by mouth every evening. ), Disp: 30 tablet, Rfl: 11 .  tamsulosin (FLOMAX) 0.4 MG CAPS capsule, Take 1 capsule (0.4 mg total) by mouth daily after supper. Until stone passed (Patient taking differently: Take 0.4 mg by mouth daily. Until stone passed), Disp: 15 capsule, Rfl: 0 .  triamcinolone (NASACORT ALLERGY 24HR) 55 MCG/ACT AERO nasal inhaler, Place 1 spray into the nose daily as needed (allergies)., Disp: , Rfl:   Past Medical History: Past Medical History:  Diagnosis Date  . Anginal pain (HCC)   . Coronary artery disease   . Diabetes mellitus without complication (HCC)   . History of kidney stones   . Hypercholesteremia   . MRSA (methicillin resistant staph aureus) culture positive     Tobacco Use: Social History   Tobacco Use  Smoking Status Never Smoker  Smokeless Tobacco Never Used    Labs: Recent Review Flowsheet Data    Labs for ITP Cardiac and Pulmonary Rehab Latest Ref Rng & Units 11/21/2018 11/21/2018 11/22/2018 11/22/2018 01/07/2019   Cholestrol 100 - 199 mg/dL - - - - 129   LDLCALC   0 - 99 mg/dL - - - - 63   HDL >39 mg/dL - - - - 48   Trlycerides 0 - 149 mg/dL - - - - 88   Hemoglobin A1c 4.8 - 5.6 % - - - - -   PHART 7.350 - 7.450 7.327(L) - 7.308(L) - -   PCO2ART 32.0 - 48.0 mmHg 43.3 - 44.2 - -   HCO3 20.0 - 28.0 mmol/L 22.5 - 22.1 - -   TCO2 22 - 32 mmol/L 24 21(L) 23 - -   ACIDBASEDEF 0.0 - 2.0 mmol/L 3.0(H) - 4.0(H) - -   O2SAT % 99.0 - 97.0 75.1 -      Capillary Blood Glucose: Lab Results  Component Value Date   GLUCAP 100 (H) 01/02/2019   GLUCAP 193 (H) 01/02/2019   GLUCAP 123 (H) 12/31/2018   GLUCAP 89 12/31/2018   GLUCAP 211 (H) 12/31/2018     Exercise Target Goals: Exercise  Program Goal: Individual exercise prescription set using results from initial 6 min walk test and THRR while considering  patient's activity barriers and safety.   Exercise Prescription Goal: Starting with aerobic activity 30 plus minutes a day, 3 days per week for initial exercise prescription. Provide home exercise prescription and guidelines that participant acknowledges understanding prior to discharge.  Activity Barriers & Risk Stratification: Activity Barriers & Cardiac Risk Stratification - 12/25/18 1112      Activity Barriers & Cardiac Risk Stratification   Activity Barriers  Other (comment)    Comments  Limited ROM in R Shoulder    Cardiac Risk Stratification  High       6 Minute Walk: 6 Minute Walk    Row Name 12/25/18 1111         6 Minute Walk   Phase  Initial     Distance  1960 feet     Walk Time  6 minutes     # of Rest Breaks  0     MPH  3.7     METS  4.11     RPE  12     Perceived Dyspnea   0     VO2 Peak  14.39     Symptoms  No     Resting HR  64 bpm     Resting BP  108/70     Resting Oxygen Saturation   97 %     Exercise Oxygen Saturation  during 6 min walk  100 %     Max Ex. HR  90 bpm     Max Ex. BP  120/70     2 Minute Post BP  110/70        Oxygen Initial Assessment:   Oxygen Re-Evaluation:   Oxygen Discharge (Final Oxygen Re-Evaluation):   Initial Exercise Prescription: Initial Exercise Prescription - 12/25/18 1100      Date of Initial Exercise RX and Referring Provider   Date  12/25/18    Referring Provider  Dr. Nasher    Expected Discharge Date  02/08/19      Treadmill   MPH  3.8    Grade  1    Minutes  15      NuStep   Level  3    SPM  85    Minutes  15    METs  4      Prescription Details   Frequency (times per week)  3    Duration  Progress to 30 minutes of continuous aerobic without signs/symptoms   of physical distress      Intensity   THRR 40-80% of Max Heartrate  62-123    Ratings of Perceived Exertion  11-13       Progression   Progression  Continue to progress workloads to maintain intensity without signs/symptoms of physical distress.      Resistance Training   Training Prescription  Yes    Weight  4 lbs.     Reps  10-15       Perform Capillary Blood Glucose checks as needed.  Exercise Prescription Changes:  Exercise Prescription Changes    Row Name 12/31/18 1000 01/07/19 1000 01/28/19 0926         Response to Exercise   Blood Pressure (Admit)  104/62  104/74  108/74     Blood Pressure (Exercise)  130/80  110/70  128/72     Blood Pressure (Exit)  118/78  110/74  116/82     Heart Rate (Admit)  68 bpm  65 bpm  70 bpm     Heart Rate (Exercise)  107 bpm  111 bpm  102 bpm     Heart Rate (Exit)  69 bpm  63 bpm  78 bpm     Rating of Perceived Exertion (Exercise)  11  11  23     Symptoms  none  none  none     Comments  blood sugar dropped to 89 after exe, lemonade given. Also, pt states he take metoprolol after exe.  blood sugar dropped to 84 after exe, lemonade given.   -     Duration  Continue with 30 min of aerobic exercise without signs/symptoms of physical distress.  Continue with 30 min of aerobic exercise without signs/symptoms of physical distress.  Continue with 30 min of aerobic exercise without signs/symptoms of physical distress.     Intensity  THRR unchanged  THRR unchanged  THRR unchanged       Progression   Progression  Continue to progress workloads to maintain intensity without signs/symptoms of physical distress.  Continue to progress workloads to maintain intensity without signs/symptoms of physical distress.  Continue to progress workloads to maintain intensity without signs/symptoms of physical distress.     Average METs  3.3  3.5  3.5       Resistance Training   Training Prescription  Yes  Yes  Yes     Weight  4 lbs.   4 lbs.   5lbs     Reps  10-15  10-15  10-15     Time  10 Minutes  10 Minutes  10 Minutes       Interval Training   Interval Training  No  No  No        Treadmill   MPH  3.4  3.4  3.4     Grade  1  1  1     Minutes  15  15  15     METs  4.07  4.07  4.07       NuStep   Level  3  3  6     SPM  85  85  85     Minutes  15  15  15     METs  2.6  3  2.9       Home Exercise Plan   Plans to continue exercise at  Home (comment) Walking 2.5 miles daily.  Home (comment) Walking 2.5 miles daily.  Home (comment) Walking 2.5 miles daily.       Frequency  Add 4 additional days to program exercise sessions.  Add 4 additional days to program exercise sessions.  Add 4 additional days to program exercise sessions.     Initial Home Exercises Provided  -  01/07/19  01/07/19        Exercise Comments:  Exercise Comments    Row Name 12/31/18 1028 01/07/19 1023 01/28/19 1010       Exercise Comments  Patient off to a good start with exercise. Increase workloads as tolerated. Reviewed goals with patient.  Reviewed home exercise guidelines. Pt understands guidelines.  Reviewed goals with patient.        Exercise Goals and Review:  Exercise Goals    Row Name 12/25/18 1116             Exercise Goals   Increase Physical Activity  Yes       Intervention  Provide advice, education, support and counseling about physical activity/exercise needs.;Develop an individualized exercise prescription for aerobic and resistive training based on initial evaluation findings, risk stratification, comorbidities and participant's personal goals.       Expected Outcomes  Short Term: Attend rehab on a regular basis to increase amount of physical activity.;Long Term: Add in home exercise to make exercise part of routine and to increase amount of physical activity.;Long Term: Exercising regularly at least 3-5 days a week.       Increase Strength and Stamina  Yes       Intervention  Provide advice, education, support and counseling about physical activity/exercise needs.;Develop an individualized exercise prescription for aerobic and resistive training based on initial  evaluation findings, risk stratification, comorbidities and participant's personal goals.       Expected Outcomes  Short Term: Increase workloads from initial exercise prescription for resistance, speed, and METs.;Short Term: Perform resistance training exercises routinely during rehab and add in resistance training at home;Long Term: Improve cardiorespiratory fitness, muscular endurance and strength as measured by increased METs and functional capacity (6MWT)       Able to understand and use rate of perceived exertion (RPE) scale  Yes       Intervention  Provide education and explanation on how to use RPE scale       Expected Outcomes  Short Term: Able to use RPE daily in rehab to express subjective intensity level;Long Term:  Able to use RPE to guide intensity level when exercising independently       Knowledge and understanding of Target Heart Rate Range (THRR)  Yes       Intervention  Provide education and explanation of THRR including how the numbers were predicted and where they are located for reference       Expected Outcomes  Short Term: Able to state/look up THRR;Long Term: Able to use THRR to govern intensity when exercising independently;Short Term: Able to use daily as guideline for intensity in rehab       Able to check pulse independently  Yes       Intervention  Provide education and demonstration on how to check pulse in carotid and radial arteries.;Review the importance of being able to check your own pulse for safety during independent exercise       Expected Outcomes  Short Term: Able to explain why pulse checking is important during independent exercise;Long Term: Able to check pulse independently and accurately       Understanding of Exercise Prescription  Yes       Intervention  Provide education, explanation, and written  materials on patient's individual exercise prescription       Expected Outcomes  Short Term: Able to explain program exercise prescription;Long Term: Able to  explain home exercise prescription to exercise independently          Exercise Goals Re-Evaluation : Exercise Goals Re-Evaluation    Row Name 12/31/18 1026 01/07/19 1021 01/28/19 1010         Exercise Goal Re-Evaluation   Exercise Goals Review  Increase Physical Activity;Able to understand and use rate of perceived exertion (RPE) scale  Increase Physical Activity;Increase Strength and Stamina;Able to understand and use rate of perceived exertion (RPE) scale;Knowledge and understanding of Target Heart Rate Range (THRR);Able to check pulse independently;Understanding of Exercise Prescription  Increase Physical Activity;Increase Strength and Stamina;Able to understand and use rate of perceived exertion (RPE) scale;Knowledge and understanding of Target Heart Rate Range (THRR);Able to check pulse independently;Understanding of Exercise Prescription     Comments  Patient tolerated first session of exercise well without symptoms. Pt's CBG dropped to 89 after exercise, asymptomatic, but recovered to 123 with lemonade. Pt able to understand and use RPE scale appropriately. Pt is walking 2.5 miles daily as his mode of home exercise.  Reviewed Home Exercise Guidelines with Pt. Pt is walking 2.5 miles 4 days per week in addition to CR. Pt stated he would like to walk with added weight. Recommended Pt to start with adding 5lbs. and progress gradually. Pt understands THRR, end points of exercise, weather precautions, warm up and cool down, and NTG use.  Patient continues walking 2.5 miles, 45 minutes on the days he doens't attend cardiac rehab. Pt knows how to check pulse independently. Increased hand weights from 4lbs to 5lbs today and tolerated well.     Expected Outcomes  Increase workloads as tolerated to help increase cardiorespiratory fitness.  Will continue to monitor and progress Pt as tolerated.  Progress workloads as tolerated to help improve strength and stamina.         Discharge Exercise  Prescription (Final Exercise Prescription Changes): Exercise Prescription Changes - 01/28/19 0926      Response to Exercise   Blood Pressure (Admit)  108/74    Blood Pressure (Exercise)  128/72    Blood Pressure (Exit)  116/82    Heart Rate (Admit)  70 bpm    Heart Rate (Exercise)  102 bpm    Heart Rate (Exit)  78 bpm    Rating of Perceived Exertion (Exercise)  23    Symptoms  none    Duration  Continue with 30 min of aerobic exercise without signs/symptoms of physical distress.    Intensity  THRR unchanged      Progression   Progression  Continue to progress workloads to maintain intensity without signs/symptoms of physical distress.    Average METs  3.5      Resistance Training   Training Prescription  Yes    Weight  5lbs    Reps  10-15    Time  10 Minutes      Interval Training   Interval Training  No      Treadmill   MPH  3.4    Grade  1    Minutes  15    METs  4.07      NuStep   Level  6    SPM  85    Minutes  15    METs  2.9      Home Exercise Plan   Plans to continue exercise at  Home (comment)   Walking 2.5 miles daily.   Frequency  Add 4 additional days to program exercise sessions.    Initial Home Exercises Provided  01/07/19       Nutrition:  Target Goals: Understanding of nutrition guidelines, daily intake of sodium <1500mg, cholesterol <200mg, calories 30% from fat and 7% or less from saturated fats, daily to have 5 or more servings of fruits and vegetables.  Biometrics: Pre Biometrics - 12/25/18 1117      Pre Biometrics   Height  5' 8" (1.727 m)    Weight  82.2 kg    Waist Circumference  38.5 inches    Hip Circumference  37 inches    Waist to Hip Ratio  1.04 %    BMI (Calculated)  27.56    Triceps Skinfold  28 mm    % Body Fat  29.3 %    Grip Strength  37 kg    Flexibility  0 in    Single Leg Stand  25.75 seconds        Nutrition Therapy Plan and Nutrition Goals:   Nutrition Assessments:   Nutrition Goals  Re-Evaluation:   Nutrition Goals Discharge (Final Nutrition Goals Re-Evaluation):   Psychosocial: Target Goals: Acknowledge presence or absence of significant depression and/or stress, maximize coping skills, provide positive support system. Participant is able to verbalize types and ability to use techniques and skills needed for reducing stress and depression.  Initial Review & Psychosocial Screening: Initial Psych Review & Screening - 12/25/18 1144      Initial Review   Current issues with  None Identified      Family Dynamics   Good Support System?  Yes   Jim has his wife for support     Barriers   Psychosocial barriers to participate in program  There are no identifiable barriers or psychosocial needs.      Screening Interventions   Interventions  Encouraged to exercise       Quality of Life Scores: Quality of Life - 12/25/18 1116      Quality of Life   Select  Quality of Life      Quality of Life Scores   Health/Function Pre  22.4 %    Socioeconomic Pre  22.79 %    Psych/Spiritual Pre  22.64 %    Family Pre  27.6 %    GLOBAL Pre  23.29 %      Scores of 19 and below usually indicate a poorer quality of life in these areas.  A difference of  2-3 points is a clinically meaningful difference.  A difference of 2-3 points in the total score of the Quality of Life Index has been associated with significant improvement in overall quality of life, self-image, physical symptoms, and general health in studies assessing change in quality of life.  PHQ-9: Recent Review Flowsheet Data    Depression screen PHQ 2/9 12/25/2018 07/17/2018 06/22/2017 11/03/2014   Decreased Interest 0 0 0 0   Down, Depressed, Hopeless 0 0 0 0   PHQ - 2 Score 0 0 0 0     Interpretation of Total Score  Total Score Depression Severity:  1-4 = Minimal depression, 5-9 = Mild depression, 10-14 = Moderate depression, 15-19 = Moderately severe depression, 20-27 = Severe depression   Psychosocial  Evaluation and Intervention:   Psychosocial Re-Evaluation: Psychosocial Re-Evaluation    Row Name 01/03/19 1451 01/31/19 1442           Psychosocial Re-Evaluation     Current issues with  None Identified  None Identified      Interventions  Encouraged to attend Cardiac Rehabilitation for the exercise  Encouraged to attend Cardiac Rehabilitation for the exercise      Continue Psychosocial Services   No Follow up required  No Follow up required         Psychosocial Discharge (Final Psychosocial Re-Evaluation): Psychosocial Re-Evaluation - 01/31/19 1442      Psychosocial Re-Evaluation   Current issues with  None Identified    Interventions  Encouraged to attend Cardiac Rehabilitation for the exercise    Continue Psychosocial Services   No Follow up required       Vocational Rehabilitation: Provide vocational rehab assistance to qualifying candidates.   Vocational Rehab Evaluation & Intervention: Vocational Rehab - 12/25/18 1145      Initial Vocational Rehab Evaluation & Intervention   Assessment shows need for Vocational Rehabilitation  No       Education: Education Goals: Education classes will be provided on a weekly basis, covering required topics. Participant will state understanding/return demonstration of topics presented.  Learning Barriers/Preferences: Learning Barriers/Preferences - 12/25/18 1118      Learning Barriers/Preferences   Learning Barriers  None    Learning Preferences  Written Material       Education Topics: Hypertension, Hypertension Reduction -Define heart disease and high blood pressure. Discus how high blood pressure affects the body and ways to reduce high blood pressure.   Exercise and Your Heart -Discuss why it is important to exercise, the FITT principles of exercise, normal and abnormal responses to exercise, and how to exercise safely.   Angina -Discuss definition of angina, causes of angina, treatment of angina, and how to  decrease risk of having angina.   Cardiac Medications -Review what the following cardiac medications are used for, how they affect the body, and side effects that may occur when taking the medications.  Medications include Aspirin, Beta blockers, calcium channel blockers, ACE Inhibitors, angiotensin receptor blockers, diuretics, digoxin, and antihyperlipidemics.   Congestive Heart Failure -Discuss the definition of CHF, how to live with CHF, the signs and symptoms of CHF, and how keep track of weight and sodium intake.   Heart Disease and Intimacy -Discus the effect sexual activity has on the heart, how changes occur during intimacy as we age, and safety during sexual activity.   Smoking Cessation / COPD -Discuss different methods to quit smoking, the health benefits of quitting smoking, and the definition of COPD.   Nutrition I: Fats -Discuss the types of cholesterol, what cholesterol does to the heart, and how cholesterol levels can be controlled.   Nutrition II: Labels -Discuss the different components of food labels and how to read food label   Heart Parts/Heart Disease and PAD -Discuss the anatomy of the heart, the pathway of blood circulation through the heart, and these are affected by heart disease.   Stress I: Signs and Symptoms -Discuss the causes of stress, how stress may lead to anxiety and depression, and ways to limit stress.   Stress II: Relaxation -Discuss different types of relaxation techniques to limit stress.   Warning Signs of Stroke / TIA -Discuss definition of a stroke, what the signs and symptoms are of a stroke, and how to identify when someone is having stroke.   Knowledge Questionnaire Score: Knowledge Questionnaire Score - 12/25/18 1118      Knowledge Questionnaire Score   Pre Score  24/24       Core Components/Risk Factors/Patient   Goals at Admission: Personal Goals and Risk Factors at Admission - 12/25/18 1118      Core  Components/Risk Factors/Patient Goals on Admission    Weight Management  Yes;Weight Maintenance;Weight Loss    Admit Weight  181 lb 3.5 oz (82.2 kg)    Diabetes  Yes    Intervention  Provide education about signs/symptoms and action to take for hypo/hyperglycemia.;Provide education about proper nutrition, including hydration, and aerobic/resistive exercise prescription along with prescribed medications to achieve blood glucose in normal ranges: Fasting glucose 65-99 mg/dL    Expected Outcomes  Short Term: Participant verbalizes understanding of the signs/symptoms and immediate care of hyper/hypoglycemia, proper foot care and importance of medication, aerobic/resistive exercise and nutrition plan for blood glucose control.;Long Term: Attainment of HbA1C < 7%.    Hypertension  Yes    Intervention  Provide education on lifestyle modifcations including regular physical activity/exercise, weight management, moderate sodium restriction and increased consumption of fresh fruit, vegetables, and low fat dairy, alcohol moderation, and smoking cessation.;Monitor prescription use compliance.    Expected Outcomes  Short Term: Continued assessment and intervention until BP is < 140/49m HG in hypertensive participants. < 130/822mHG in hypertensive participants with diabetes, heart failure or chronic kidney disease.;Long Term: Maintenance of blood pressure at goal levels.    Lipids  Yes    Intervention  Provide education and support for participant on nutrition & aerobic/resistive exercise along with prescribed medications to achieve LDL <7065mHDL >22m37m  Expected Outcomes  Short Term: Participant states understanding of desired cholesterol values and is compliant with medications prescribed. Participant is following exercise prescription and nutrition guidelines.;Long Term: Cholesterol controlled with medications as prescribed, with individualized exercise RX and with personalized nutrition plan. Value goals: LDL <  70mg28mL > 40 mg.    Stress  Yes    Intervention  Offer individual and/or small group education and counseling on adjustment to heart disease, stress management and health-related lifestyle change. Teach and support self-help strategies.;Refer participants experiencing significant psychosocial distress to appropriate mental health specialists for further evaluation and treatment. When possible, include family members and significant others in education/counseling sessions.       Core Components/Risk Factors/Patient Goals Review:  Goals and Risk Factor Review    Row Name 01/03/19 1452 01/31/19 1441           Core Components/Risk Factors/Patient Goals Review   Personal Goals Review  Weight Management/Obesity;Diabetes;Hypertension;Lipids  Weight Management/Obesity;Diabetes;Hypertension;Lipids      Review  Jim iClair Gullingff to a good start to exercise. Jim's vital signs and CBG's ahve been stable.  Jim iClair Gullingoing well with exercise at phase 2 cardiac rehab. Jim's vital signs and CBG's have been stable.      Expected Outcomes  Patient will continue to participate in phase 2 cardiac rehab for exercise, nutrition and lifestyle modifications  Patient will continue to participate in phase 2 cardiac rehab for exercise, nutrition and lifestyle modifications         Core Components/Risk Factors/Patient Goals at Discharge (Final Review):  Goals and Risk Factor Review - 01/31/19 1441      Core Components/Risk Factors/Patient Goals Review   Personal Goals Review  Weight Management/Obesity;Diabetes;Hypertension;Lipids    Review  Jim iClair Gullingoing well with exercise at phase 2 cardiac rehab. Jim's vital signs and CBG's have been stable.    Expected Outcomes  Patient will continue to participate in phase 2 cardiac rehab for exercise, nutrition and lifestyle modifications  ITP Comments: ITP Comments    Row Name 12/25/18 1039 01/03/19 1450 01/31/19 1440       ITP Comments  Dr. Traci Turner, Medical Director   30 Day ITP Review. Jim is off to a good start to exercise.  30 Day ITP Review. Patient is with good participation and attendance in phase 2 cardiac rehab.        Comments: See ITP comments.Maria Whitaker, RN,BSN 01/31/2019 2:44 PM 

## 2019-02-01 ENCOUNTER — Other Ambulatory Visit: Payer: Self-pay

## 2019-02-01 ENCOUNTER — Encounter (HOSPITAL_COMMUNITY)
Admission: RE | Admit: 2019-02-01 | Discharge: 2019-02-01 | Disposition: A | Discharge status: Home / Self Care | Payer: Medicare HMO | Source: Ambulatory Visit | Attending: Cardiovascular Disease | Admitting: Cardiovascular Disease

## 2019-02-01 DIAGNOSIS — I2510 Atherosclerotic heart disease of native coronary artery without angina pectoris: Secondary | ICD-10-CM | POA: Diagnosis not present

## 2019-02-01 DIAGNOSIS — Z951 Presence of aortocoronary bypass graft: Secondary | ICD-10-CM

## 2019-02-01 DIAGNOSIS — E119 Type 2 diabetes mellitus without complications: Secondary | ICD-10-CM | POA: Diagnosis not present

## 2019-02-01 DIAGNOSIS — Z7984 Long term (current) use of oral hypoglycemic drugs: Secondary | ICD-10-CM | POA: Diagnosis not present

## 2019-02-01 DIAGNOSIS — Z7982 Long term (current) use of aspirin: Secondary | ICD-10-CM | POA: Diagnosis not present

## 2019-02-01 DIAGNOSIS — E7800 Pure hypercholesterolemia, unspecified: Secondary | ICD-10-CM | POA: Diagnosis not present

## 2019-02-01 DIAGNOSIS — Z87442 Personal history of urinary calculi: Secondary | ICD-10-CM | POA: Diagnosis not present

## 2019-02-01 DIAGNOSIS — Z79899 Other long term (current) drug therapy: Secondary | ICD-10-CM | POA: Diagnosis not present

## 2019-02-04 ENCOUNTER — Other Ambulatory Visit: Payer: Self-pay

## 2019-02-04 ENCOUNTER — Encounter (HOSPITAL_COMMUNITY)
Admission: RE | Admit: 2019-02-04 | Discharge: 2019-02-04 | Disposition: A | Discharge status: Home / Self Care | Payer: Medicare HMO | Source: Ambulatory Visit | Attending: Cardiovascular Disease | Admitting: Cardiovascular Disease

## 2019-02-04 VITALS — Ht 68.0 in | Wt 185.2 lb

## 2019-02-04 DIAGNOSIS — E119 Type 2 diabetes mellitus without complications: Secondary | ICD-10-CM | POA: Diagnosis not present

## 2019-02-04 DIAGNOSIS — Z951 Presence of aortocoronary bypass graft: Secondary | ICD-10-CM

## 2019-02-04 DIAGNOSIS — E7800 Pure hypercholesterolemia, unspecified: Secondary | ICD-10-CM | POA: Diagnosis not present

## 2019-02-04 DIAGNOSIS — Z87442 Personal history of urinary calculi: Secondary | ICD-10-CM | POA: Diagnosis not present

## 2019-02-04 DIAGNOSIS — Z7982 Long term (current) use of aspirin: Secondary | ICD-10-CM | POA: Diagnosis not present

## 2019-02-04 DIAGNOSIS — Z7984 Long term (current) use of oral hypoglycemic drugs: Secondary | ICD-10-CM | POA: Diagnosis not present

## 2019-02-04 DIAGNOSIS — I2510 Atherosclerotic heart disease of native coronary artery without angina pectoris: Secondary | ICD-10-CM | POA: Diagnosis not present

## 2019-02-04 DIAGNOSIS — Z79899 Other long term (current) drug therapy: Secondary | ICD-10-CM | POA: Diagnosis not present

## 2019-02-06 ENCOUNTER — Other Ambulatory Visit: Payer: Self-pay

## 2019-02-06 ENCOUNTER — Encounter (HOSPITAL_COMMUNITY)
Admission: RE | Admit: 2019-02-06 | Discharge: 2019-02-06 | Disposition: A | Discharge status: Home / Self Care | Payer: Medicare HMO | Source: Ambulatory Visit | Attending: Cardiovascular Disease | Admitting: Cardiovascular Disease

## 2019-02-06 DIAGNOSIS — E7800 Pure hypercholesterolemia, unspecified: Secondary | ICD-10-CM | POA: Diagnosis not present

## 2019-02-06 DIAGNOSIS — E119 Type 2 diabetes mellitus without complications: Secondary | ICD-10-CM | POA: Diagnosis not present

## 2019-02-06 DIAGNOSIS — Z79899 Other long term (current) drug therapy: Secondary | ICD-10-CM | POA: Diagnosis not present

## 2019-02-06 DIAGNOSIS — I2510 Atherosclerotic heart disease of native coronary artery without angina pectoris: Secondary | ICD-10-CM | POA: Diagnosis not present

## 2019-02-06 DIAGNOSIS — Z7982 Long term (current) use of aspirin: Secondary | ICD-10-CM | POA: Diagnosis not present

## 2019-02-06 DIAGNOSIS — Z951 Presence of aortocoronary bypass graft: Secondary | ICD-10-CM | POA: Diagnosis not present

## 2019-02-06 DIAGNOSIS — Z7984 Long term (current) use of oral hypoglycemic drugs: Secondary | ICD-10-CM | POA: Diagnosis not present

## 2019-02-06 DIAGNOSIS — Z87442 Personal history of urinary calculi: Secondary | ICD-10-CM | POA: Diagnosis not present

## 2019-02-08 ENCOUNTER — Other Ambulatory Visit: Payer: Self-pay

## 2019-02-08 ENCOUNTER — Encounter (HOSPITAL_COMMUNITY)
Admission: RE | Admit: 2019-02-08 | Discharge: 2019-02-08 | Disposition: A | Discharge status: Home / Self Care | Payer: Medicare HMO | Source: Ambulatory Visit | Attending: Cardiovascular Disease | Admitting: Cardiovascular Disease

## 2019-02-08 DIAGNOSIS — Z87442 Personal history of urinary calculi: Secondary | ICD-10-CM | POA: Diagnosis not present

## 2019-02-08 DIAGNOSIS — Z951 Presence of aortocoronary bypass graft: Secondary | ICD-10-CM

## 2019-02-08 DIAGNOSIS — E119 Type 2 diabetes mellitus without complications: Secondary | ICD-10-CM | POA: Diagnosis not present

## 2019-02-08 DIAGNOSIS — Z7982 Long term (current) use of aspirin: Secondary | ICD-10-CM | POA: Diagnosis not present

## 2019-02-08 DIAGNOSIS — E7800 Pure hypercholesterolemia, unspecified: Secondary | ICD-10-CM | POA: Diagnosis not present

## 2019-02-08 DIAGNOSIS — I2510 Atherosclerotic heart disease of native coronary artery without angina pectoris: Secondary | ICD-10-CM | POA: Diagnosis not present

## 2019-02-08 DIAGNOSIS — Z79899 Other long term (current) drug therapy: Secondary | ICD-10-CM | POA: Diagnosis not present

## 2019-02-08 DIAGNOSIS — Z7984 Long term (current) use of oral hypoglycemic drugs: Secondary | ICD-10-CM | POA: Diagnosis not present

## 2019-02-08 NOTE — Progress Notes (Addendum)
Discharge Progress Report  Patient Details  Name: Thomas Washington MRN: [id number] Date of Birth: [birth year] Referring Provider:     CARDIAC REHAB PHASE II ORIENTATION from 12/25/2018 in [facility]  Referring Provider  Dr. Cathie Olden       Number of Visits: 18  Reason for Discharge:  Patient reached a stable level of exercise. Patient independent in their exercise. Patient has met program and personal goals.  Smoking History:  Social History   Tobacco Use  Smoking Status Never Smoker  Smokeless Tobacco Never Used    Diagnosis:  S/P CABG x 4 11/21/18  ADL UCSD:   Initial Exercise Prescription: Initial Exercise Prescription - 12/25/18 1100      Date of Initial Exercise RX and Referring Provider   Date  12/25/18    Referring Provider  Dr. Cathie Olden    Expected Discharge Date  02/08/19      Treadmill   MPH  3.8    Grade  1    Minutes  15      NuStep   Level  3    SPM  85    Minutes  15    METs  4      Prescription Details   Frequency (times per week)  3    Duration  Progress to 30 minutes of continuous aerobic without signs/symptoms of physical distress      Intensity   THRR 40-80% of Max Heartrate  62-123    Ratings of Perceived Exertion  11-13      Progression   Progression  Continue to progress workloads to maintain intensity without signs/symptoms of physical distress.      Resistance Training   Training Prescription  Yes    Weight  4 lbs.     Reps  10-15       Discharge Exercise Prescription (Final Exercise Prescription Changes): Exercise Prescription Changes - 02/08/19 1000      Response to Exercise   Blood Pressure (Admit)  116/74    Blood Pressure (Exercise)  128/72    Blood Pressure (Exit)  104/64    Heart Rate (Admit)  63 bpm    Heart Rate (Exercise)  96 bpm    Heart Rate (Exit)  63 bpm    Rating of Perceived Exertion (Exercise)  11    Symptoms  none    Comments  Pt graduated CR program today.     Duration   Continue with 30 min of aerobic exercise without signs/symptoms of physical distress.    Intensity  THRR unchanged      Progression   Progression  Continue to progress workloads to maintain intensity without signs/symptoms of physical distress.    Average METs  4.1      Resistance Training   Training Prescription  Yes    Weight  5lbs    Reps  10-15    Time  10 Minutes      Interval Training   Interval Training  No      Treadmill   MPH  3.4    Grade  4    Minutes  15    METs  5.48      NuStep   Level  6    SPM  85    Minutes  15    METs  2.7      Home Exercise Plan   Plans to continue exercise at  Home (comment)   Walking 2.5 miles daily.   Frequency  Add 4 additional days to program exercise sessions.    Initial Home Exercises Provided  01/07/19       Functional Capacity: 6 Minute Walk    Row Name 12/25/18 1111 02/04/19 1022       6 Minute Walk   Phase  Initial  Discharge    Distance  1960 feet  1930 feet    Distance Feet Change  -  -30 ft    Walk Time  6 minutes  6 minutes    # of Rest Breaks  0  0    MPH  3.7  3.6    METS  4.11  3.9    RPE  12  10    Perceived Dyspnea   0  0    VO2 Peak  14.39  13.8    Symptoms  No  No    Resting HR  64 bpm  58 bpm    Resting BP  108/70  100/70    Resting Oxygen Saturation   97 %  -    Exercise Oxygen Saturation  during 6 min walk  100 %  -    Max Ex. HR  90 bpm  90 bpm    Max Ex. BP  120/70  124/66    2 Minute Post BP  110/70  104/62       Psychological, QOL, Others - Outcomes: PHQ 2/9: Depression screen [facility] 2/9 02/08/2019 12/25/2018 07/17/2018 06/22/2017 11/03/2014  Decreased Interest 0 0 0 0 0  Down, Depressed, Hopeless 0 0 0 0 0  PHQ - 2 Score 0 0 0 0 0    Quality of Life: Quality of Life - 02/04/19 0951      Quality of Life   Select  Quality of Life      Quality of Life Scores   Health/Function Pre  22.4 %    Health/Function Post  25.53 %    Health/Function % Change  13.97 %    Socioeconomic Pre  22.79  %    Socioeconomic Post  24.29 %    Socioeconomic % Change   6.58 %    Psych/Spiritual Pre  22.64 %    Psych/Spiritual Post  23.29 %    Psych/Spiritual % Change  2.87 %    Family Pre  27.6 %    Family Post  24.9 %    Family % Change  -9.78 %    GLOBAL Pre  23.29 %    GLOBAL Post  24.72 %    GLOBAL % Change  6.14 %       Personal Goals: Goals established at orientation with interventions provided to work toward goal. Personal Goals and Risk Factors at Admission - 12/25/18 1118      Core Components/Risk Factors/Patient Goals on Admission    Weight Management  Yes;Weight Maintenance;Weight Loss    Admit Weight  181 lb 3.5 oz (82.2 kg)    Diabetes  Yes    Intervention  Provide education about signs/symptoms and action to take for hypo/hyperglycemia.;Provide education about proper nutrition, including hydration, and aerobic/resistive exercise prescription along with prescribed medications to achieve blood glucose in normal ranges: Fasting glucose 65-99 mg/dL    Expected Outcomes  Short Term: Participant verbalizes understanding of the signs/symptoms and immediate care of hyper/hypoglycemia, proper foot care and importance of medication, aerobic/resistive exercise and nutrition plan for blood glucose control.;Long Term: Attainment of HbA1C < 7%.    Hypertension  Yes    Intervention  Provide education  on lifestyle modifcations including regular physical activity/exercise, weight management, moderate sodium restriction and increased consumption of fresh fruit, vegetables, and low fat dairy, alcohol moderation, and smoking cessation.;Monitor prescription use compliance.    Expected Outcomes  Short Term: Continued assessment and intervention until BP is < 140/57m HG in hypertensive participants. < 130/867mHG in hypertensive participants with diabetes, heart failure or chronic kidney disease.;Long Term: Maintenance of blood pressure at goal levels.    Lipids  Yes    Intervention  Provide  education and support for participant on nutrition & aerobic/resistive exercise along with prescribed medications to achieve LDL <7051mHDL >19m50m  Expected Outcomes  Short Term: Participant states understanding of desired cholesterol values and is compliant with medications prescribed. Participant is following exercise prescription and nutrition guidelines.;Long Term: Cholesterol controlled with medications as prescribed, with individualized exercise RX and with personalized nutrition plan. Value goals: LDL < 70mg81mL > 40 mg.    Stress  Yes    Intervention  Offer individual and/or small group education and counseling on adjustment to heart disease, stress management and health-related lifestyle change. Teach and support self-help strategies.;Refer participants experiencing significant psychosocial distress to appropriate mental health specialists for further evaluation and treatment. When possible, include family members and significant others in education/counseling sessions.        Personal Goals Discharge: Goals and Risk Factor Review    Row Name 01/03/19 1452 01/31/19 1441 02/08/19 0939         Core Components/Risk Factors/Patient Goals Review   Personal Goals Review  Weight Management/Obesity;Diabetes;Hypertension;Lipids  Weight Management/Obesity;Diabetes;Hypertension;Lipids  Weight Management/Obesity;Diabetes;Hypertension;Lipids     Review  Jim iClair Gullingff to a good start to exercise. Jim's vital signs and CBG's ahve been stable.  Jim iClair Gullingoing well with exercise at phase 2 cardiac rehab. Jim's vital signs and CBG's have been stable.  Jim iClair Gullingoing well with exercise at phase 2 cardiac rehab. Jim's vital signs and CBG's have been stable. Jim gClair Gullinguates today     Expected Outcomes  Patient will continue to participate in phase 2 cardiac rehab for exercise, nutrition and lifestyle modifications  Patient will continue to participate in phase 2 cardiac rehab for exercise, nutrition and lifestyle  modifications  jim will continue to exercise at home by walking upon graduation from cardiac rehab., nutrition and lifestyle modifications        Exercise Goals and Review: Exercise Goals    Row Name 12/25/18 1116             Exercise Goals   Increase Physical Activity  Yes       Intervention  Provide advice, education, support and counseling about physical activity/exercise needs.;Develop an individualized exercise prescription for aerobic and resistive training based on initial evaluation findings, risk stratification, comorbidities and participant's personal goals.       Expected Outcomes  Short Term: Attend rehab on a regular basis to increase amount of physical activity.;Long Term: Add in home exercise to make exercise part of routine and to increase amount of physical activity.;Long Term: Exercising regularly at least 3-5 days a week.       Increase Strength and Stamina  Yes       Intervention  Provide advice, education, support and counseling about physical activity/exercise needs.;Develop an individualized exercise prescription for aerobic and resistive training based on initial evaluation findings, risk stratification, comorbidities and participant's personal goals.       Expected Outcomes  Short Term: Increase workloads from initial exercise prescription for  resistance, speed, and METs.;Short Term: Perform resistance training exercises routinely during rehab and add in resistance training at home;Long Term: Improve cardiorespiratory fitness, muscular endurance and strength as measured by increased METs and functional capacity (6MWT)       Able to understand and use rate of perceived exertion (RPE) scale  Yes       Intervention  Provide education and explanation on how to use RPE scale       Expected Outcomes  Short Term: Able to use RPE daily in rehab to express subjective intensity level;Long Term:  Able to use RPE to guide intensity level when exercising independently        Knowledge and understanding of Target Heart Rate Range (THRR)  Yes       Intervention  Provide education and explanation of THRR including how the numbers were predicted and where they are located for reference       Expected Outcomes  Short Term: Able to state/look up THRR;Long Term: Able to use THRR to govern intensity when exercising independently;Short Term: Able to use daily as guideline for intensity in rehab       Able to check pulse independently  Yes       Intervention  Provide education and demonstration on how to check pulse in carotid and radial arteries.;Review the importance of being able to check your own pulse for safety during independent exercise       Expected Outcomes  Short Term: Able to explain why pulse checking is important during independent exercise;Long Term: Able to check pulse independently and accurately       Understanding of Exercise Prescription  Yes       Intervention  Provide education, explanation, and written materials on patient's individual exercise prescription       Expected Outcomes  Short Term: Able to explain program exercise prescription;Long Term: Able to explain home exercise prescription to exercise independently          Exercise Goals Re-Evaluation: Exercise Goals Re-Evaluation    Row Name 12/31/18 1026 01/07/19 1021 01/28/19 1010 02/08/19 1051       Exercise Goal Re-Evaluation   Exercise Goals Review  Increase Physical Activity;Able to understand and use rate of perceived exertion (RPE) scale  Increase Physical Activity;Increase Strength and Stamina;Able to understand and use rate of perceived exertion (RPE) scale;Knowledge and understanding of Target Heart Rate Range (THRR);Able to check pulse independently;Understanding of Exercise Prescription  Increase Physical Activity;Increase Strength and Stamina;Able to understand and use rate of perceived exertion (RPE) scale;Knowledge and understanding of Target Heart Rate Range (THRR);Able to check pulse  independently;Understanding of Exercise Prescription  Increase Physical Activity;Understanding of Exercise Prescription;Increase Strength and Stamina;Knowledge and understanding of Target Heart Rate Range (THRR);Able to understand and use rate of perceived exertion (RPE) scale;Able to check pulse independently    Comments  Patient tolerated first session of exercise well without symptoms. Pt's CBG dropped to 89 after exercise, asymptomatic, but recovered to 123 with lemonade. Pt able to understand and use RPE scale appropriately. Pt is walking 2.5 miles daily as his mode of home exercise.  Reviewed Home Exercise Guidelines with Pt. Pt is walking 2.5 miles 4 days per week in addition to CR. Pt stated he would like to walk with added weight. Recommended Pt to start with adding 5lbs. and progress gradually. Pt understands THRR, end points of exercise, weather precautions, warm up and cool down, and NTG use.  Patient continues walking 2.5 miles, 45 minutes on the days  he doens't attend cardiac rehab. Pt knows how to check pulse independently. Increased hand weights from 4lbs to 5lbs today and tolerated well.  Pt completed the CR program today. Pt tolerated the program well and ended the program with a MET level of 4.1. Pt plans to continue walking for exercise at home 5-7 days per week for 30-45 minutes per day.    Expected Outcomes  Increase workloads as tolerated to help increase cardiorespiratory fitness.  Will continue to monitor and progress Pt as tolerated.  Progress workloads as tolerated to help improve strength and stamina.  Pt will continue exercising at home for exercise.       Nutrition & Weight - Outcomes: Pre Biometrics - 12/25/18 1117      Pre Biometrics   Height  _0  (1.727 m)    Weight  82.2 kg    Waist Circumference  38.5 inches    Hip Circumference  37 inches    Waist to Hip Ratio  1.04 %    BMI (Calculated)  27.56    Triceps Skinfold  28 mm    % Body Fat  29.3 %    Grip Strength   37 kg    Flexibility  0 in    Single Leg Stand  25.75 seconds      Post Biometrics - 02/04/19 1023       Post  Biometrics   Height  _1  (1.727 m)    Weight  84 kg    Waist Circumference  38 inches    Hip Circumference  37 inches    Waist to Hip Ratio  1.03 %    BMI (Calculated)  28.16    Triceps Skinfold  29 mm    % Body Fat  29.4 %    Grip Strength  38 kg    Flexibility  0 in    Single Leg Stand  22.43 seconds       Nutrition:   Nutrition Discharge:   Education Questionnaire Score: Knowledge Questionnaire Score - 02/04/19 1010      Knowledge Questionnaire Score   Pre Score  24/24    Post Score  24/24       Goals reviewed with patient; copy given to patient. Pt graduated from cardiac rehab program 02/08/19 with completion of 18 exercise sessions in Phase II. Pt maintained good attendance and progressed nicely during his participation in rehab as evidenced by increased MET level.   Medication list reconciled. Repeat  PHQ score- 0 .  Pt has made significant lifestyle changes and should be commended for his success. Pt feels he has achieved his goals during cardiac rehab.   Pt plans to continue exercise by walking at home will also discharge from the virtual APP per patients request.Sommer Spickard Venetia Maxon, RN,BSN 02/08/2019 3:58 PM

## 2019-02-12 DIAGNOSIS — R69 Illness, unspecified: Secondary | ICD-10-CM | POA: Diagnosis not present

## 2019-02-14 ENCOUNTER — Telehealth: Payer: Self-pay | Admitting: Internal Medicine

## 2019-02-14 NOTE — Telephone Encounter (Signed)
LVM to CB and reschedule lab appointment from the 11 to the 9.

## 2019-02-20 ENCOUNTER — Other Ambulatory Visit (INDEPENDENT_AMBULATORY_CARE_PROVIDER_SITE_OTHER): Payer: Medicare HMO | Admitting: Internal Medicine

## 2019-02-20 ENCOUNTER — Other Ambulatory Visit: Payer: Self-pay

## 2019-02-20 DIAGNOSIS — E1169 Type 2 diabetes mellitus with other specified complication: Secondary | ICD-10-CM

## 2019-02-20 DIAGNOSIS — I10 Essential (primary) hypertension: Secondary | ICD-10-CM | POA: Diagnosis not present

## 2019-02-20 DIAGNOSIS — E785 Hyperlipidemia, unspecified: Secondary | ICD-10-CM | POA: Diagnosis not present

## 2019-02-20 NOTE — Patient Instructions (Signed)
Labs drawn by CMA

## 2019-02-21 LAB — HEPATIC FUNCTION PANEL
AG Ratio: 1.7 (calc) (ref 1.0–2.5)
ALT: 18 U/L (ref 9–46)
AST: 18 U/L (ref 10–35)
Albumin: 4.3 g/dL (ref 3.6–5.1)
Alkaline phosphatase (APISO): 44 U/L (ref 35–144)
Bilirubin, Direct: 0.1 mg/dL (ref 0.0–0.2)
Globulin: 2.5 g/dL (calc) (ref 1.9–3.7)
Indirect Bilirubin: 0.3 mg/dL (calc) (ref 0.2–1.2)
Total Bilirubin: 0.4 mg/dL (ref 0.2–1.2)
Total Protein: 6.8 g/dL (ref 6.1–8.1)

## 2019-02-21 LAB — MICROALBUMIN / CREATININE URINE RATIO
Creatinine, Urine: 96 mg/dL (ref 20–320)
Microalb Creat Ratio: 54 mcg/mg creat — ABNORMAL HIGH (ref <30)
Microalb, Ur: 5.2 mg/dL

## 2019-02-21 LAB — HEMOGLOBIN A1C
Hgb A1c MFr Bld: 6.1 % of total Hgb — ABNORMAL HIGH (ref <5.7)
Mean Plasma Glucose: 128 (calc)
eAG (mmol/L): 7.1 (calc)

## 2019-02-21 LAB — LIPID PANEL
Cholesterol: 118 mg/dL (ref <200)
HDL: 46 mg/dL (ref >=40)
LDL Cholesterol (Calc): 53 mg/dL (calc)
Non-HDL Cholesterol (Calc): 72 mg/dL (calc) (ref <130)
Total CHOL/HDL Ratio: 2.6 (calc) (ref <5.0)
Triglycerides: 105 mg/dL (ref <150)

## 2019-02-22 ENCOUNTER — Other Ambulatory Visit: Payer: Medicare HMO | Admitting: Internal Medicine

## 2019-02-25 ENCOUNTER — Other Ambulatory Visit: Payer: Self-pay

## 2019-02-25 ENCOUNTER — Ambulatory Visit (INDEPENDENT_AMBULATORY_CARE_PROVIDER_SITE_OTHER): Payer: Medicare HMO | Admitting: Cardiovascular Disease

## 2019-02-25 ENCOUNTER — Encounter: Payer: Self-pay | Admitting: Cardiovascular Disease

## 2019-02-25 VITALS — BP 110/80 | HR 58 | Ht 69.0 in | Wt 186.8 lb

## 2019-02-25 DIAGNOSIS — I2510 Atherosclerotic heart disease of native coronary artery without angina pectoris: Secondary | ICD-10-CM

## 2019-02-25 NOTE — Progress Notes (Signed)
Thomas Washington Date of Birth  08-18-1950       [place] [address], [address], [place], [place]  [address]   [place], [place]  [address] [phone]     [phone]   Fax  [phone]    Fax [phone]  Problem List: 1. Pericarditis 2. Hyperlipidemia 3. Hypertension 4. Diabetes mellitus   Previous Notes.  Clair Gulling is a 68 yo who developed CP recently 2 days ago .  Very tender to the touch.  Felt like rib pain.  Thought it was indigestion initially.   Was positional , chest pain eased with sitting forward.  Worse with deep breath.  He drove himself to the ER. D-dimer was normal < 0.27.  Troponin was normal.  He took Motrin and feels much better.    + cough, ( dry)   No fevers, no chills, no  Some dyspnea with walking.  Strong family history of CAD - father died at age 69 of MI.   He works as a Software engineer at [employer]. His glucose control is pretty good.  HbA1c is 6.9  He had a stress myoview which was normal. An echocardiogram which revealed normal left ventricular systolic function. He was found to have some diastolic dysfunction.  His back doing all of his normal activities without difficulty.  Sept. 14, 2020:  Clair Gulling is seen today for follow-up visit.  He was found to have coronary artery disease.  Is admitted in early June and had coronary artery bypass grafting. Doing great.  No angina .   Still some MSK pain  Exercising well   Current Outpatient Medications on File Prior to Visit  Medication Sig Dispense Refill  . ACCU-CHEK FASTCLIX LANCETS MISC Use as instructed to test blood sugar daily 100 each 2  . acetaminophen (TYLENOL) 500 MG tablet Take 2 tablets (1,000 mg total) by mouth every 6 (six) hours. 30 tablet 0  . aspirin EC 325 MG EC tablet Take 1 tablet (325 mg total) by mouth daily. 30 tablet 0  . B Complex Vitamins (B COMPLEX PO) Take 1 tablet by mouth daily.     . Blood Glucose Monitoring Suppl  (ACCU-CHEK AVIVA PLUS) w/Device KIT Use as instructed to test blood sugar daily 1 kit 0  . calcium carbonate (TUMS - DOSED IN MG ELEMENTAL CALCIUM) 500 MG chewable tablet Chew 2 tablets by mouth daily as needed for indigestion or heartburn.    . Cholecalciferol (VITAMIN D3) 250 MCG (10000 UT) capsule Take 10,000 Units by mouth at bedtime.    . Cyanocobalamin (B-12) 5000 MCG CAPS Take 5,000 mcg by mouth at bedtime.    . folic acid (FOLVITE) 364 MCG tablet Take 800 mcg by mouth daily.    Marland Kitchen glucose blood (ACCU-CHEK ACTIVE STRIPS) test strip Use as instructed to test blood sugar daily 100 each 2  . JANUVIA 100 MG tablet TAKE 1 TABLET BY MOUTH EVERY DAY 90 tablet 3  . metFORMIN (GLUCOPHAGE) 1000 MG tablet Take 0.5-1 tablets (500-1,000 mg total) by mouth See admin instructions. Take 500 mg in the morning and 1000 mg in the evening    . metoprolol tartrate (LOPRESSOR) 25 MG tablet Take 0.5 tablets (12.5 mg total) by mouth daily. 45 tablet 3  . rosuvastatin (CRESTOR) 40 MG tablet Take 1 tablet (40 mg total) by mouth daily. 30 tablet 11  . tamsulosin (FLOMAX) 0.4 MG CAPS capsule Take 1 capsule (0.4  mg total) by mouth daily after supper. Until stone passed 15 capsule 0  . triamcinolone (NASACORT ALLERGY 24HR) 55 MCG/ACT AERO nasal inhaler Place 1 spray into the nose daily as needed (allergies).     No current facility-administered medications on file prior to visit.     Allergies  Allergen Reactions  . Quinolones Other (See Comments)    Patient was warned about not using Cipro and similar antibiotics. Recent studies have raised concern that fluoroquinolone antibiotics could be associated with an increased risk of aortic aneurysm Fluoroquinolones have non-antimicrobial properties that might jeopardise the integrity of the extracellular matrix of the vascular wall In a  propensity score matched cohort study in [place], there was a 66% increased rate of aortic aneurysm or dissection associated with oral  fluoroquinolone use, compared wit    Past Medical History:  Diagnosis Date  . Anginal pain ([place])   . Coronary artery disease   . Diabetes mellitus without complication ([place])   . History of kidney stones   . Hypercholesteremia   . MRSA (methicillin resistant staph aureus) culture positive     Past Surgical History:  Procedure Laterality Date  . CARDIAC CATHETERIZATION    . CORONARY ARTERY BYPASS GRAFT N/A 11/21/2018   Procedure: CORONARY ARTERY BYPASS GRAFTING (CABG), ON PUMP, TIMES 4, USING LEFT INTERNAL MAMMARY ARTERY AND RIGHT GREATER SAPHENOUS VEIN, LIMA TO LAD, RIGHT SAPHENOUS VEIN TO OM2, RIGHT SAPHENOUS VEIN TO DISTAL CIRC, RIGHT SAPHENOUS VEIN TO ACUTE MARGINAL;  Surgeon: Grace Isaac, MD;  Location: [facility];  Service: Open Heart Surgery;  Laterality: N/A;  . LEFT HEART CATH AND CORONARY ANGIOGRAPHY N/A 11/14/2018   Procedure: LEFT HEART CATH AND CORONARY ANGIOGRAPHY;  Surgeon: Jettie Booze, MD;  Location: [facility];  Service: Cardiovascular;  Laterality: N/A;  . TEE WITHOUT CARDIOVERSION N/A 11/21/2018   Procedure: TRANSESOPHAGEAL ECHOCARDIOGRAM (TEE);  Surgeon: Grace Isaac, MD;  Location: [facility];  Service: Open Heart Surgery;  Laterality: N/A;  . VASECTOMY    . WISDOM TOOTH EXTRACTION      Social History   Tobacco Use  Smoking Status Never Smoker  Smokeless Tobacco Never Used    Social History   Substance and Sexual Activity  Alcohol Use Yes   Comment: 1 bottle of wine monthly + 2 beers    Family History  Problem Relation Age of Onset  . Heart attack Father   . Stroke Mother   . Healthy Sister   . Healthy Daughter   . Colon cancer Neg Hx     Reviw of Systems:  Reviewed in the HPI.  All other systems are negative.  Physical Exam: Blood pressure 110/80, pulse (!) 58, height 5' 9" (1.753 m), weight 186 lb 12.8 oz (84.7 kg), SpO2 97 %.  GEN:  Well nourished, well developed in no acute distress HEENT: Normal NECK: No JVD; No carotid  bruits LYMPHATICS: No lymphadenopathy CARDIAC: RRR.  Sternotomy looks great.  His sternum is healing well. RESPIRATORY:  Clear to auscultation without rales, wheezing or rhonchi  ABDOMEN: Soft, non-tender, non-distended MUSCULOSKELETAL:  No edema; No deformity  SKIN: Warm and dry NEUROLOGIC:  Alert and oriented x 3  ECG:  Assessment / Plan  1.  CAD:   S/p CABG.  We discussed weight lifting limitations Wants to go shooting - advised against shotgun shooting at this time.   2.  Hyperlipidemia: Continue current medications.  His last lipid levels look great.  Overall is doing well  Mertie Moores, MD  02/25/2019 2:34 PM    [place] [address],  [place], [place]  [address] Pager [phone] Phone: [phone]; Fax: [phone]

## 2019-02-25 NOTE — Patient Instructions (Signed)

## 2019-02-26 ENCOUNTER — Ambulatory Visit (INDEPENDENT_AMBULATORY_CARE_PROVIDER_SITE_OTHER): Payer: Medicare HMO | Admitting: Internal Medicine

## 2019-02-26 ENCOUNTER — Encounter: Payer: Self-pay | Admitting: Internal Medicine

## 2019-02-26 VITALS — BP 110/80 | HR 53 | Temp 98.6°F | Ht 69.0 in | Wt 184.0 lb

## 2019-02-26 DIAGNOSIS — E8881 Metabolic syndrome: Secondary | ICD-10-CM

## 2019-02-26 DIAGNOSIS — E1169 Type 2 diabetes mellitus with other specified complication: Secondary | ICD-10-CM

## 2019-02-26 DIAGNOSIS — E1142 Type 2 diabetes mellitus with diabetic polyneuropathy: Secondary | ICD-10-CM | POA: Diagnosis not present

## 2019-02-26 DIAGNOSIS — I2510 Atherosclerotic heart disease of native coronary artery without angina pectoris: Secondary | ICD-10-CM

## 2019-02-26 DIAGNOSIS — Z951 Presence of aortocoronary bypass graft: Secondary | ICD-10-CM

## 2019-02-26 DIAGNOSIS — I10 Essential (primary) hypertension: Secondary | ICD-10-CM | POA: Diagnosis not present

## 2019-02-26 DIAGNOSIS — E785 Hyperlipidemia, unspecified: Secondary | ICD-10-CM

## 2019-02-26 NOTE — Progress Notes (Signed)
   Subjective:    Patient ID: Thomas Washington, male    DOB: Aug 28, 1950, 68 y.o.   MRN: [id number]  HPI   68 year old Male for follow up. Has been going to cardiac rehab s/p CABG x 15 November 2018.   In Feb, weight was 193 pounds.Weight now 186 pounds. Looks noticeably thinner.  Hx of type 2 diabetes mellitus and Hgb AIC is excellent at 6.1 % on medication.  Medicare wellness visit and CPE due Feb.2021. Has been walking a lot. Misses hunting turkeys out of state which has been postponed due to pandemic.  Lipid panel is within normal limits on statin medication.  Review of Systems no new complaints     Objective:   Physical Exam Blood pressure 110/80, pulse 58, weight 186 pounds BMI 27.59  Skin warm and dry.  Neck is supple without JVD thyromegaly or carotid bruits.  Chest clear to auscultation.  Cardiac exam soft regular rate and rhythm normal S1 and S2.  Extremities without pitting edema.  Lipid panel is normal on statin medication. Hemoglobin A1c has improved from 6.5% in June to 6.1%.  Liver functions normal.  Urine microalbumin creatinine ratio: 54 mcg/mL.  This is up slightly from January and will continue to be followed. Assessment & Plan:  Status post four-vessel CABG in June 2020 and doing well  Impaired glucose tolerance stable on current medication  Hyperlipidemia-normal lipid panel on statin medication  Essential hypertension-stable and within normal limits on current medications  Plan: Return February 2020 for Medicare wellness visit and health maintenance exam along with fasting labs.  He will receive flu vaccine at pharmacy.  25 minutes spent with patient.  His spirits are good status post bypass surgery and he seems to feel well.

## 2019-02-26 NOTE — Telephone Encounter (Signed)
Called back and rescheduled.

## 2019-03-05 ENCOUNTER — Other Ambulatory Visit: Payer: Self-pay | Admitting: Cardiovascular Disease

## 2019-03-05 MED ORDER — METOPROLOL TARTRATE 25 MG PO TABS: 12.5000 mg | ORAL_TABLET | Freq: Two times a day (BID) | ORAL | 3 refills | Status: DC

## 2019-03-06 ENCOUNTER — Ambulatory Visit: Payer: Medicare HMO | Admitting: Physician Assistant

## 2019-03-08 ENCOUNTER — Ambulatory Visit: Payer: Medicare HMO | Admitting: Physician Assistant

## 2019-03-09 NOTE — Patient Instructions (Signed)
It was a pleasure to see you today.  Return in February for annual Medicare wellness visit and health maintenance exam.  Have flu vaccine at pharmacy.  Continue current medications.  Continue cardiac rehab diet and exercise.  I am pleased with your lab work.

## 2019-03-11 ENCOUNTER — Ambulatory Visit: Payer: Medicare HMO | Admitting: Physician Assistant

## 2019-07-12 ENCOUNTER — Ambulatory Visit: Payer: Medicare HMO

## 2019-07-18 ENCOUNTER — Ambulatory Visit: Payer: Medicare HMO

## 2019-07-23 ENCOUNTER — Ambulatory Visit: Payer: Medicare HMO

## 2019-07-23 DIAGNOSIS — R69 Illness, unspecified: Secondary | ICD-10-CM | POA: Diagnosis not present

## 2019-07-28 ENCOUNTER — Other Ambulatory Visit: Payer: Self-pay | Admitting: Cardiovascular Disease

## 2019-08-09 ENCOUNTER — Ambulatory Visit: Payer: Medicare HMO

## 2019-08-20 ENCOUNTER — Other Ambulatory Visit: Payer: Self-pay

## 2019-08-20 ENCOUNTER — Other Ambulatory Visit: Payer: Medicare HMO | Admitting: Internal Medicine

## 2019-08-20 DIAGNOSIS — I10 Essential (primary) hypertension: Secondary | ICD-10-CM | POA: Diagnosis not present

## 2019-08-20 DIAGNOSIS — I2510 Atherosclerotic heart disease of native coronary artery without angina pectoris: Secondary | ICD-10-CM | POA: Diagnosis not present

## 2019-08-20 DIAGNOSIS — R809 Proteinuria, unspecified: Secondary | ICD-10-CM

## 2019-08-20 DIAGNOSIS — E1129 Type 2 diabetes mellitus with other diabetic kidney complication: Secondary | ICD-10-CM | POA: Diagnosis not present

## 2019-08-20 DIAGNOSIS — E785 Hyperlipidemia, unspecified: Secondary | ICD-10-CM

## 2019-08-20 DIAGNOSIS — E1169 Type 2 diabetes mellitus with other specified complication: Secondary | ICD-10-CM | POA: Diagnosis not present

## 2019-08-20 DIAGNOSIS — E782 Mixed hyperlipidemia: Secondary | ICD-10-CM

## 2019-08-20 DIAGNOSIS — E8881 Metabolic syndrome: Secondary | ICD-10-CM

## 2019-08-20 DIAGNOSIS — Z125 Encounter for screening for malignant neoplasm of prostate: Secondary | ICD-10-CM

## 2019-08-20 DIAGNOSIS — E1142 Type 2 diabetes mellitus with diabetic polyneuropathy: Secondary | ICD-10-CM

## 2019-08-20 DIAGNOSIS — Z0000 Encounter for general adult medical examination without abnormal findings: Secondary | ICD-10-CM | POA: Diagnosis not present

## 2019-08-21 LAB — COMPLETE METABOLIC PANEL WITH GFR
AG Ratio: 1.5 (calc) (ref 1.0–2.5)
ALT: 15 U/L (ref 9–46)
AST: 18 U/L (ref 10–35)
Albumin: 4.2 g/dL (ref 3.6–5.1)
Alkaline phosphatase (APISO): 41 U/L (ref 35–144)
BUN: 11 mg/dL (ref 7–25)
CO2: 28 mmol/L (ref 20–32)
Calcium: 9.5 mg/dL (ref 8.6–10.3)
Chloride: 105 mmol/L (ref 98–110)
Creat: 1.01 mg/dL (ref 0.70–1.25)
GFR, Est African American: 89 mL/min/{1.73_m2} (ref >=60)
GFR, Est Non African American: 77 mL/min/{1.73_m2} (ref >=60)
Globulin: 2.8 g/dL (calc) (ref 1.9–3.7)
Glucose, Bld: 126 mg/dL — ABNORMAL HIGH (ref 65–99)
Potassium: 4.9 mmol/L (ref 3.5–5.3)
Sodium: 141 mmol/L (ref 135–146)
Total Bilirubin: 0.5 mg/dL (ref 0.2–1.2)
Total Protein: 7 g/dL (ref 6.1–8.1)

## 2019-08-21 LAB — LIPID PANEL
Cholesterol: 118 mg/dL (ref <200)
HDL: 50 mg/dL (ref >=40)
LDL Cholesterol (Calc): 51 mg/dL (calc)
Non-HDL Cholesterol (Calc): 68 mg/dL (calc) (ref <130)
Total CHOL/HDL Ratio: 2.4 (calc) (ref <5.0)
Triglycerides: 85 mg/dL (ref <150)

## 2019-08-21 LAB — CBC WITH DIFFERENTIAL/PLATELET
Absolute Monocytes: 671 cells/uL (ref 200–950)
Basophils Absolute: 67 cells/uL (ref 0–200)
Basophils Relative: 1.1 %
Eosinophils Absolute: 153 cells/uL (ref 15–500)
Eosinophils Relative: 2.5 %
HCT: 43.3 % (ref 38.5–50.0)
Hemoglobin: 14.1 g/dL (ref 13.2–17.1)
Lymphs Abs: 2123 cells/uL (ref 850–3900)
MCH: 28.3 pg (ref 27.0–33.0)
MCHC: 32.6 g/dL (ref 32.0–36.0)
MCV: 86.9 fL (ref 80.0–100.0)
MPV: 10 fL (ref 7.5–12.5)
Monocytes Relative: 11 %
Neutro Abs: 3087 cells/uL (ref 1500–7800)
Neutrophils Relative %: 50.6 %
Platelets: 219 10*3/uL (ref 140–400)
RBC: 4.98 10*6/uL (ref 4.20–5.80)
RDW: 13.9 % (ref 11.0–15.0)
Total Lymphocyte: 34.8 %
WBC: 6.1 10*3/uL (ref 3.8–10.8)

## 2019-08-21 LAB — HEMOGLOBIN A1C
Hgb A1c MFr Bld: 6.3 % of total Hgb — ABNORMAL HIGH (ref <5.7)
Mean Plasma Glucose: 134 (calc)
eAG (mmol/L): 7.4 (calc)

## 2019-08-21 LAB — PSA: PSA: 1.4 ng/mL (ref <=4.0)

## 2019-08-22 ENCOUNTER — Other Ambulatory Visit: Payer: Medicare HMO | Admitting: Internal Medicine

## 2019-08-23 ENCOUNTER — Ambulatory Visit (INDEPENDENT_AMBULATORY_CARE_PROVIDER_SITE_OTHER): Payer: Medicare HMO | Admitting: Internal Medicine

## 2019-08-23 ENCOUNTER — Other Ambulatory Visit: Payer: Self-pay

## 2019-08-23 ENCOUNTER — Encounter: Payer: Self-pay | Admitting: Internal Medicine

## 2019-08-23 VITALS — BP 130/90 | HR 53 | Temp 98.0°F | Ht 67.0 in | Wt 186.0 lb

## 2019-08-23 DIAGNOSIS — E8881 Metabolic syndrome: Secondary | ICD-10-CM | POA: Diagnosis not present

## 2019-08-23 DIAGNOSIS — N401 Enlarged prostate with lower urinary tract symptoms: Secondary | ICD-10-CM | POA: Diagnosis not present

## 2019-08-23 DIAGNOSIS — E785 Hyperlipidemia, unspecified: Secondary | ICD-10-CM | POA: Diagnosis not present

## 2019-08-23 DIAGNOSIS — G609 Hereditary and idiopathic neuropathy, unspecified: Secondary | ICD-10-CM | POA: Diagnosis not present

## 2019-08-23 DIAGNOSIS — I712 Thoracic aortic aneurysm, without rupture: Secondary | ICD-10-CM | POA: Diagnosis not present

## 2019-08-23 DIAGNOSIS — Z0000 Encounter for general adult medical examination without abnormal findings: Secondary | ICD-10-CM | POA: Diagnosis not present

## 2019-08-23 DIAGNOSIS — E1169 Type 2 diabetes mellitus with other specified complication: Secondary | ICD-10-CM

## 2019-08-23 DIAGNOSIS — R351 Nocturia: Secondary | ICD-10-CM | POA: Diagnosis not present

## 2019-08-23 DIAGNOSIS — Z6829 Body mass index (BMI) 29.0-29.9, adult: Secondary | ICD-10-CM | POA: Diagnosis not present

## 2019-08-23 DIAGNOSIS — Z951 Presence of aortocoronary bypass graft: Secondary | ICD-10-CM

## 2019-08-23 DIAGNOSIS — I7121 Aneurysm of the ascending aorta, without rupture: Secondary | ICD-10-CM

## 2019-08-23 LAB — POCT URINALYSIS DIPSTICK
Appearance: NEGATIVE
Bilirubin, UA: NEGATIVE
Blood, UA: NEGATIVE
Glucose, UA: NEGATIVE
Ketones, UA: NEGATIVE
Leukocytes, UA: NEGATIVE
Nitrite, UA: NEGATIVE
Odor: NEGATIVE
Protein, UA: POSITIVE — AB
Spec Grav, UA: 1.01 (ref 1.010–1.025)
Urobilinogen, UA: 0.2 E.U./dL
pH, UA: 6.5 (ref 5.0–8.0)

## 2019-08-23 MED ORDER — SITAGLIPTIN PHOSPHATE 100 MG PO TABS: 100.0000 mg | ORAL_TABLET | Freq: Every day | ORAL | 3 refills | Status: DC

## 2019-08-23 MED ORDER — DOXYCYCLINE HYCLATE 100 MG PO TABS: 100.0000 mg | ORAL_TABLET | Freq: Two times a day (BID) | ORAL | 0 refills | Status: DC

## 2019-08-23 NOTE — Progress Notes (Signed)
Subjective:    Patient ID: Thomas Washington, male    DOB: [birth year], 69 y.o.   MRN: [id number]  HPI  69 year old Male for health maintenance exam and evaluation of medical issues including diabetes mellitus in coronary artery disease status post CABG.  History of hyperlipidemia.  History of BPH, essential hypertension, metabolic syndrome, obesity.  No known drug allergies.  Colonoscopy by [name] October 2018.  2 tubular adenomas and one polyp with benign findings.  History of Candida esophagitis 1991.  Schatzki's ring 1991.  Had acute pericarditis 2014 and was seen by [name].  In June 2021 he underwent coronary artery bypass graft x4 with [name].  He also was found to have dilated thoracic ascending aorta 4.5 cm in diameter which is being watched.  Social history: Married with 1 adult daughter.  1 grandson.  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  Non-smoker.  Social alcohol consumption.  Native of [place].  Wife is a retired Immunologist.  Has annual diabetic eye exam  Review of Systems denies chest pain or shortness of breath.  Has returned to do hunting and shooting.  Has some trips planned.  Walks a lot and has no complaints at the present time.  Enjoying retirement.     Objective:   Physical Exam Blood pressure 130/90 pulse 53 temperature 98 degrees orally pulse oximetry 99% weight 186 pounds BMI 29.13  Skin warm and dry.  No cervical adenopathy.  No thyromegaly.  No carotid bruits.  Chest is clear to auscultation.  Cardiac exam regular rate and rhythm no murmurs or gallops.  Abdomen soft nondistended without hepatosplenomegaly masses or tenderness.  Prostate is slightly boggy without nodules.  No lower extremity edema.  Neuro intact without focal deficits.       Assessment & Plan:  Status post coronary artery bypass graft x4.  Is on beta-blocker and blood pressure is stable.  Followed by cardiology.  History of ascending aortic aneurysm 4.5 cm  being followed by cardiology  Type 2 diabetes mellitus hemoglobin A1c 6.3% and was 6.1% in September.  He will be more active with upcoming hunting trips.  Continue current medications  including Metformin and Januvia  Hyperlipidemia-lipid panel is normal.  Continue Crestor 40 mg daily.   BMI 29.140 improved from last year when it was 30.23  History of peripheral neuropathy treated with oral B12  BPH treated with Flomax  Plan: Return in 6 months for follow-up of diabetes mellitus with hemoglobin A1c in the office visit.  Continue diet exercise and weight loss efforts.  No change in medications.  Subjective:   Patient presents for Medicare Annual/Subsequent preventive examination.  Review Past Medical/Family/Social: See above   Risk Factors  Current exercise habits: Walks a great deal and is physically active Dietary issues discussed: Low-fat low carbohydrate discussed  Cardiac risk factors: Hyperlipidemia and diabetes mellitus.  Family history.  Depression Screen  (Note: if answer to either of the following is "Yes", a more complete depression screening is indicated)   Over the past two weeks, have you felt down, depressed or hopeless? No  Over the past two weeks, have you felt little interest or pleasure in doing things? No Have you lost interest or pleasure in daily life? No Do you often feel hopeless? No Do you cry easily over simple problems? No   Activities of Daily Living  In your present state of health, do you have any difficulty performing the following activities?:  Driving? No  Managing money? No  Feeding yourself? No  Getting from bed to chair? No  Climbing a flight of stairs? No  Preparing food and eating?: No  Bathing or showering? No  Getting dressed: No  Getting to the toilet? No  Using the toilet:No  Moving around from place to place: No  In the past year have you fallen or had a near fall?:No  Are you sexually active? Yes Do you have more than  one partner? No   Hearing Difficulties: No  Do you often ask people to speak up or repeat themselves? No  Do you experience ringing or noises in your ears? No  Do you have difficulty understanding soft or whispered voices? No  Do you feel that you have a problem with memory? No Do you often misplace items? No    Home Safety:  Do you have a smoke alarm at your residence? Yes Do you have grab bars in the bathroom?  Yes  do you have throw rugs in your house?  None   Cognitive Testing  Alert? Yes Normal Appearance?Yes  Oriented to person? Yes Place? Yes  Time? Yes  Recall of three objects? Yes  Can perform simple calculations? Yes  Displays appropriate judgment?Yes  Can read the correct time from a watch face?Yes   List the Names of Other Physician/Practitioners you currently use:  See referral list for the physicians patient is currently seeing.  Cardiologist   Review of Systems: See above   Objective:     General appearance: Appears stated age and mildly obese  Head: Normocephalic, without obvious abnormality, atraumatic  Eyes: conj clear, EOMi PEERLA  Ears: normal TM's and external ear canals both ears  Nose: Nares normal. Septum midline. Mucosa normal. No drainage or sinus tenderness.  Throat: lips, mucosa, and tongue normal; teeth and gums normal  Neck: no adenopathy, no carotid bruit, no JVD, supple, symmetrical, trachea midline and thyroid not enlarged, symmetric, no tenderness/mass/nodules  No CVA tenderness.  Lungs: clear to auscultation bilaterally  Breasts: normal appearance, no masses or tenderness Heart: regular rate and rhythm, S1, S2 normal, no murmur, click, rub or gallop  Abdomen: soft, non-tender; bowel sounds normal; no masses, no organomegaly  Musculoskeletal: ROM normal in all joints, no crepitus, no deformity, Normal muscle strengthen. Back  is symmetric, no curvature. Skin: Skin color, texture, turgor normal. No rashes or lesions  Lymph nodes:  Cervical, supraclavicular, and axillary nodes normal.  Neurologic: CN 2 -12 Normal, Normal symmetric reflexes. Normal coordination and gait  Psych: Alert & Oriented x 3, Mood appear stable.    Assessment:    Annual wellness medicare exam   Plan:    During the course of the visit the patient was educated and counseled about appropriate screening and preventive services including:    Has had to have [place] vaccines January 25 in February 15  Tetanus immunization is up-to-date  Pneumococcal vaccines up-to-date    Patient Instructions (the written plan) was given to the patient.  Medicare Attestation  I have personally reviewed:  The patient's medical and social history  Their use of alcohol, tobacco or illicit drugs  Their current medications and supplements  The patient's functional ability including ADLs,fall risks, home safety risks, cognitive, and hearing and visual impairment  Diet and physical activities  Evidence for depression or mood disorders  The patient's weight, height, BMI, and visual acuity have been recorded in the chart. I have made referrals, counseling, and provided education to  the patient based on review of the above and I have provided the patient with a written personalized care plan for preventive services.

## 2019-08-26 ENCOUNTER — Other Ambulatory Visit: Payer: Self-pay | Admitting: Cardiovascular Disease

## 2019-08-29 IMAGING — CR CHEST - 2 VIEW
2 series · 2 of 2 positions shown · non-contrast
Comparison: 09/11/2012

CLINICAL DATA: Preop CABG

EXAM:
CHEST - 2 VIEW

[w chest pa]
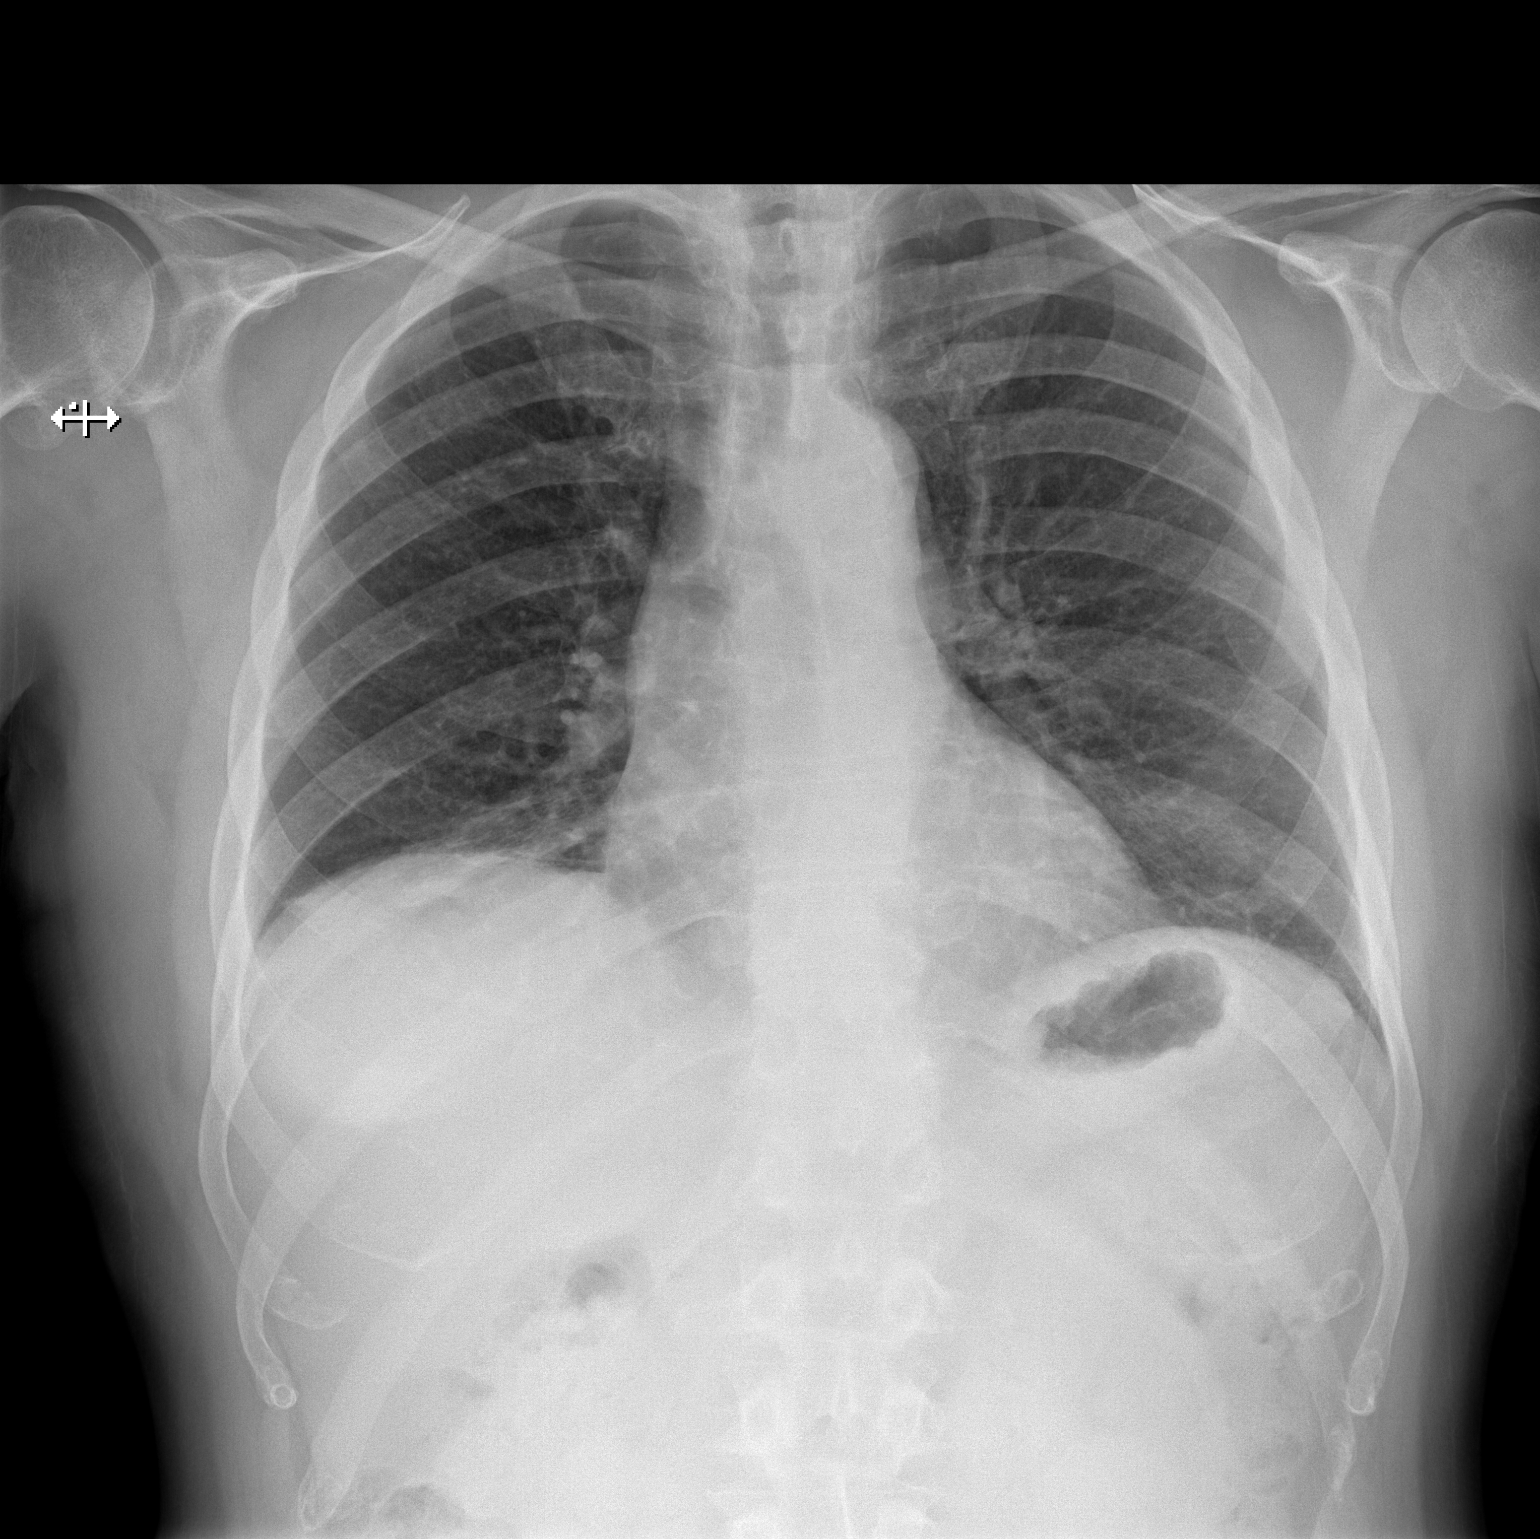

[w chest lat]
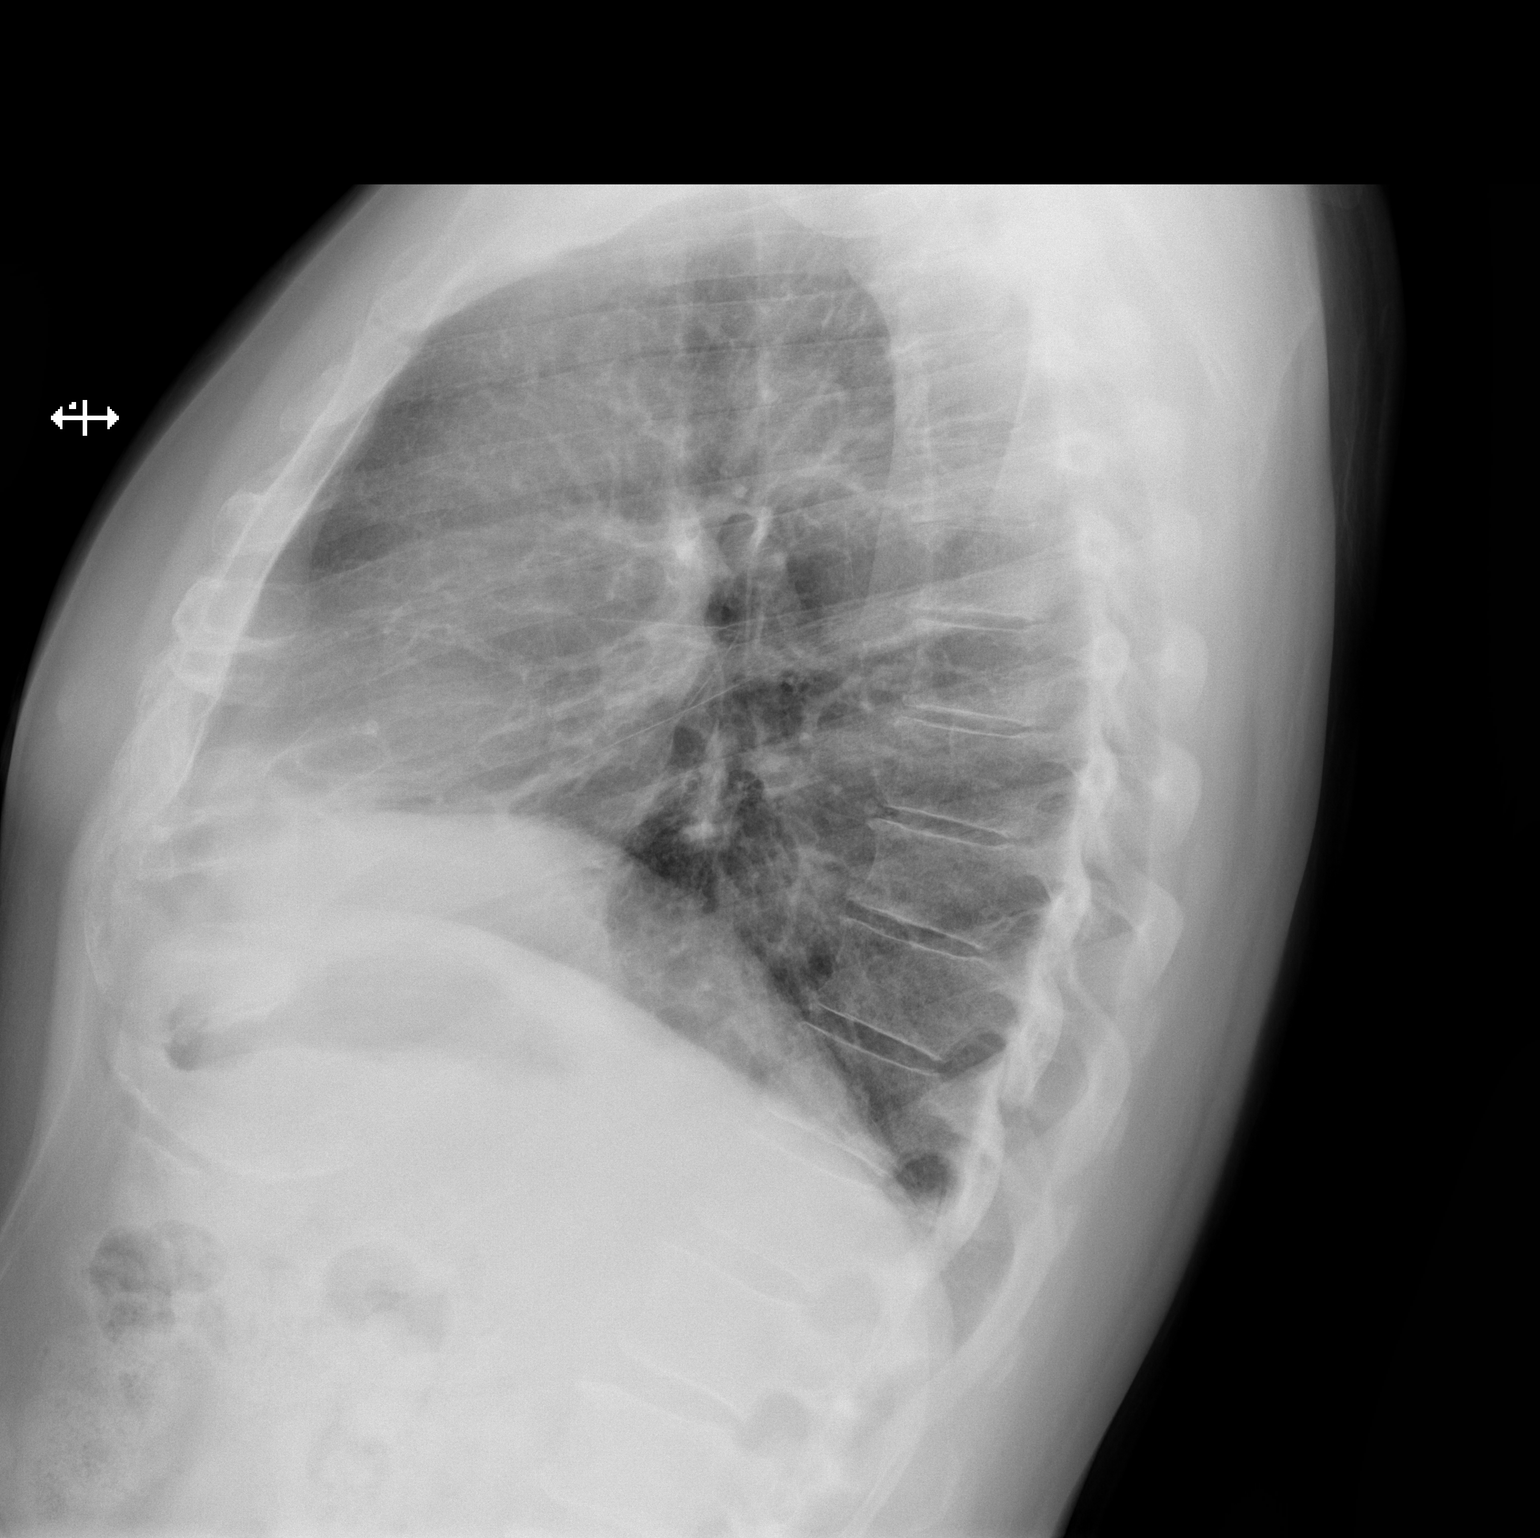

[2 of 2 positions shown; findings below may reference images not displayed]

FINDINGS: Left lung clear. Persistent right basilar density, likely scarring.
Heart is normal size. No effusions or acute bony abnormality.
IMPRESSION: Right basilar scarring, stable.  No active disease.

## 2019-09-01 IMAGING — DX PORTABLE CHEST - 1 VIEW
1 series · 1 of 1 positions shown · non-contrast
Comparison: 11/22/2018

CLINICAL DATA: Recent coronary bypass grafting with chest pain

EXAM:
PORTABLE CHEST 1 VIEW

[chest]
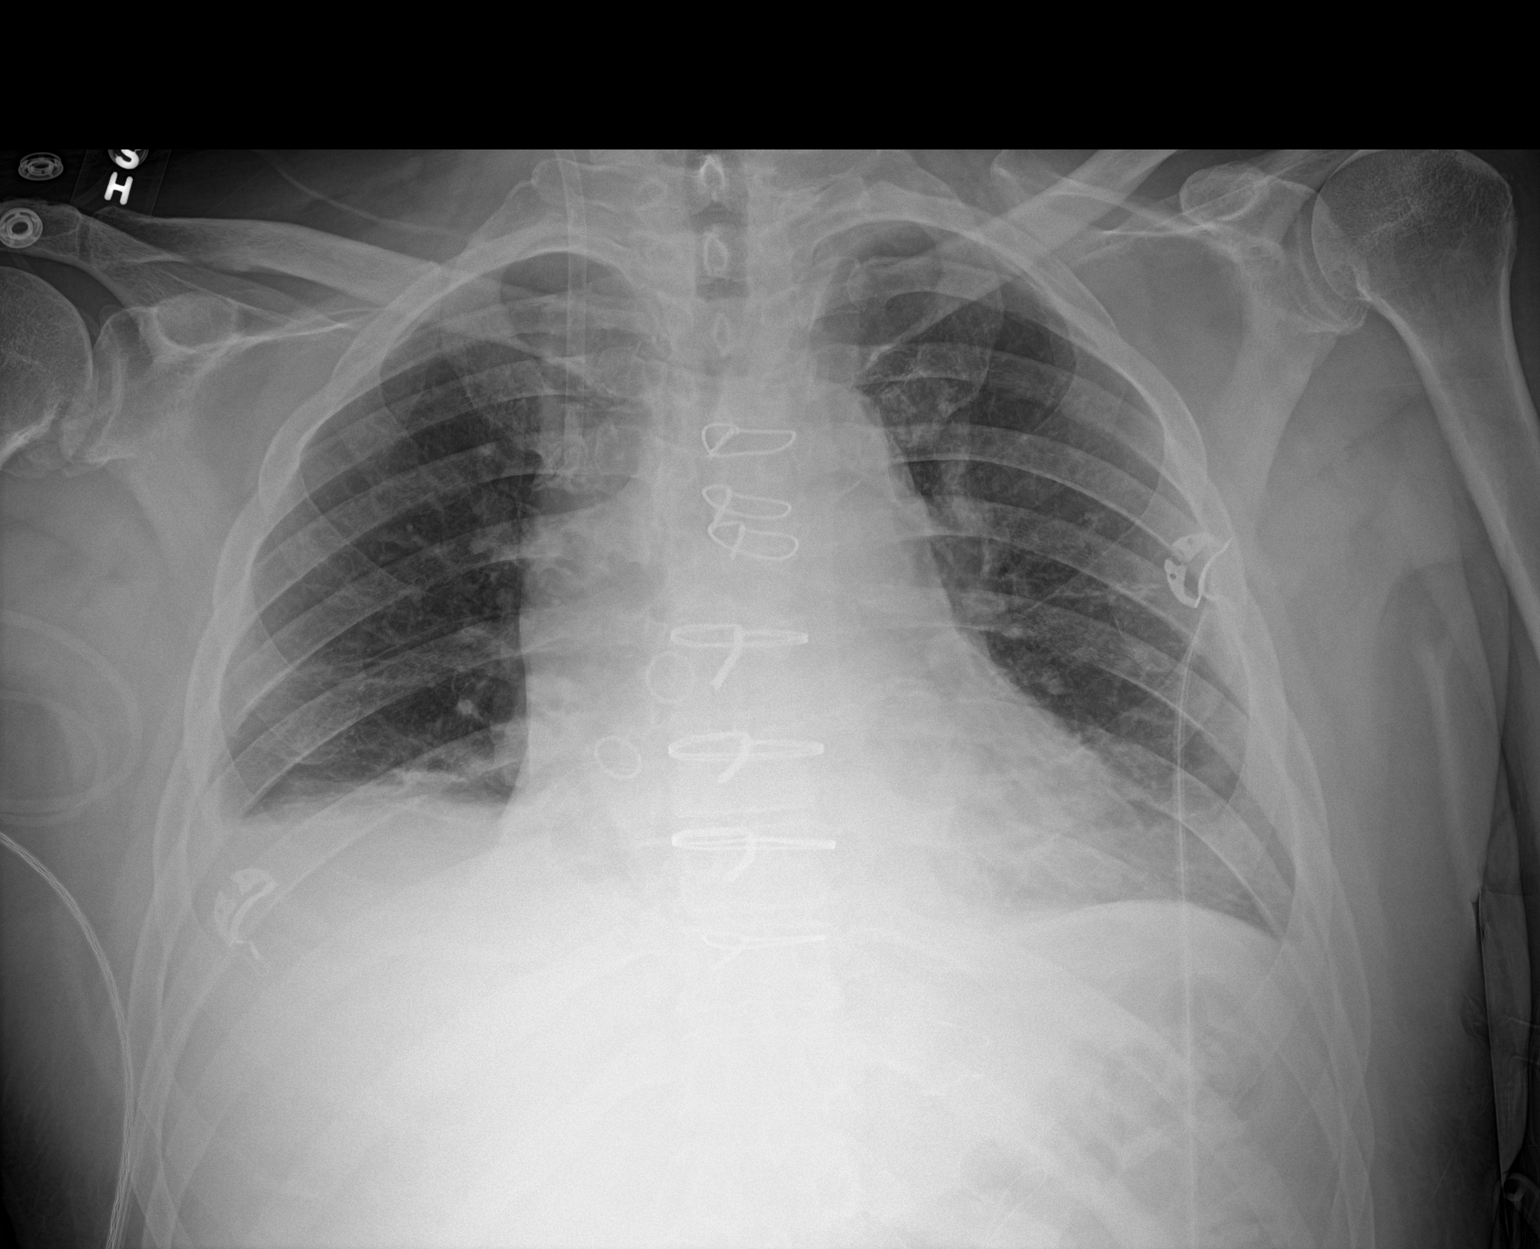

[1 of 1 positions shown; findings below may reference images not displayed]

FINDINGS: Cardiac shadow is mildly prominent but stable. Postsurgical changes
are noted. Swan-Ganz catheter and mediastinal drain have been
removed. Left thoracostomy catheter has been removed as well. No
pneumothorax is seen. Mild bibasilar atelectasis is noted with small
right pleural effusion.
IMPRESSION: Bibasilar atelectasis with small right pleural effusion.

## 2019-09-03 ENCOUNTER — Ambulatory Visit: Payer: Medicare HMO | Admitting: Cardiovascular Disease

## 2019-09-03 ENCOUNTER — Other Ambulatory Visit: Payer: Self-pay

## 2019-09-03 ENCOUNTER — Encounter: Payer: Self-pay | Admitting: Cardiovascular Disease

## 2019-09-03 VITALS — BP 114/72 | HR 52 | Ht 67.0 in | Wt 185.5 lb

## 2019-09-03 DIAGNOSIS — I2510 Atherosclerotic heart disease of native coronary artery without angina pectoris: Secondary | ICD-10-CM | POA: Diagnosis not present

## 2019-09-03 DIAGNOSIS — I10 Essential (primary) hypertension: Secondary | ICD-10-CM | POA: Diagnosis not present

## 2019-09-03 DIAGNOSIS — E785 Hyperlipidemia, unspecified: Secondary | ICD-10-CM

## 2019-09-03 DIAGNOSIS — Z951 Presence of aortocoronary bypass graft: Secondary | ICD-10-CM

## 2019-09-03 MED ORDER — ASPIRIN EC 81 MG PO TBEC: 81.0000 mg | DELAYED_RELEASE_TABLET | Freq: Every day | ORAL | Status: AC

## 2019-09-03 NOTE — Patient Instructions (Addendum)
Medication Instructions:  Your physician has recommended you make the following change in your medication:  DECREASE Aspirin to 81 mg once daily  *If you need a refill on your cardiac medications before your next appointment, please call your pharmacy*   Lab Work: Your physician recommends that you return for lab work in: 12 months on the day of or a few days before your office visit. You will need to FAST for this appointment - nothing to eat or drink after midnight the night before except water.   If you have labs (blood work) drawn today and your tests are completely normal, you will receive your results only by: Marland Kitchen MyChart Message (if you have MyChart) OR . A paper copy in the mail If you have any lab test that is abnormal or we need to change your treatment, we will call you to review the results.   Testing/Procedures: None Ordered    Follow-Up: At [facility], you and your health needs are our priority.  As part of our continuing mission to provide you with exceptional heart care, we have created designated Provider Care Teams.  These Care Teams include your primary Cardiologist (physician) and Advanced Practice Providers (APPs -  Physician Assistants and Nurse Practitioners) who all work together to provide you with the care you need, when you need it.  We recommend signing up for the patient portal called "MyChart".  Sign up information is provided on this After Visit Summary.  MyChart is used to connect with patients for Virtual Visits (Telemedicine).  Patients are able to view lab/test results, encounter notes, upcoming appointments, etc.  Non-urgent messages can be sent to your provider as well.   To learn more about what you can do with MyChart, go to [email or web address].    Your next appointment:   1 year(s)  The format for your next appointment:   In Person  Provider:   You may see Mertie Moores, MD or one of the following Advanced Practice Providers on your  designated Care Team:    Richardson Dopp, PA-C  [place], [place]  Daune Perch, [place]

## 2019-09-03 NOTE — Progress Notes (Signed)
Thomas Washington Date of Birth  March 09, 1951       [facility] [address], [place], [place]  [address]   [place], [place]  [address] [phone]     [phone]   Fax  [phone]    Fax [phone]  Problem List: 1. Pericarditis 2. Hyperlipidemia 3. Hypertension 4. Diabetes mellitus   Previous Notes.  Thomas Washington is a 69 yo who developed CP recently 2 days ago .  Very tender to the touch.  Felt like rib pain.  Thought it was indigestion initially.   Was positional , chest pain eased with sitting forward.  Worse with deep breath.  He drove himself to the ER. D-dimer was normal < 0.27.  Troponin was normal.  He took Motrin and feels much better.    + cough, ( dry)   No fevers, no chills, no  Some dyspnea with walking.  Strong family history of CAD - father died at age 81 of MI.   He works as a Software engineer at [employer]. His glucose control is pretty good.  HbA1c is 6.9  He had a stress myoview which was normal. An echocardiogram which revealed normal left ventricular systolic function. He was found to have some diastolic dysfunction.  His back doing all of his normal activities without difficulty.  Sept. 14, 2020:  Thomas Washington is seen today for follow-up visit.  He was found to have coronary artery disease.  Is admitted in early June and had coronary artery bypass grafting. Doing great.  No angina .   Still some MSK pain  Exercising well   September 03, 2019:  Thomas Washington is seen today for follow-up visit.  He has a history of coronary artery disease and coronary artery bypass grafting in June, 2020. Exercising regularly .  No CP,  No chest wall tenderness.    Planning on going bear hunting in [place] in several months .  Is tolerating the metoprolol better now.   Reviewed lipids - numbers look great.    Current Outpatient Medications on File Prior to Visit  Medication Sig Dispense Refill  . ACCU-CHEK FASTCLIX LANCETS  MISC Use as instructed to test blood sugar daily 100 each 2  . acetaminophen (TYLENOL) 500 MG tablet Take 2 tablets (1,000 mg total) by mouth every 6 (six) hours. 30 tablet 0  . aspirin EC 325 MG EC tablet Take 1 tablet (325 mg total) by mouth daily. 30 tablet 0  . B Complex Vitamins (B COMPLEX PO) Take 1 tablet by mouth daily.     . Blood Glucose Monitoring Suppl (ACCU-CHEK AVIVA PLUS) w/Device KIT Use as instructed to test blood sugar daily 1 kit 0  . calcium carbonate (TUMS - DOSED IN MG ELEMENTAL CALCIUM) 500 MG chewable tablet Chew 2 tablets by mouth daily as needed for indigestion or heartburn.    . Cholecalciferol (VITAMIN D3) 250 MCG (10000 UT) capsule Take 10,000 Units by mouth at bedtime.    . Cyanocobalamin (B-12) 5000 MCG CAPS Take 5,000 mcg by mouth at bedtime.    Marland Kitchen doxycycline (VIBRA-TABS) 100 MG tablet Take 1 tablet (100 mg total) by mouth 2 (two) times daily. 20 tablet 0  . folic acid (FOLVITE) 704 MCG tablet Take 800 mcg by mouth daily.    Marland Kitchen glucose blood (ACCU-CHEK ACTIVE STRIPS) test strip Use as instructed to test blood sugar daily 100 each 2  . metFORMIN (GLUCOPHAGE) 1000 MG  tablet Take 0.5-1 tablets (500-1,000 mg total) by mouth See admin instructions. Take 500 mg in the morning and 1000 mg in the evening    . metoprolol tartrate (LOPRESSOR) 25 MG tablet Take 0.5 tablets (12.5 mg total) by mouth 2 (two) times daily. 90 tablet 3  . rosuvastatin (CRESTOR) 40 MG tablet TAKE 1 TABLET BY MOUTH EVERY DAY 90 tablet 1  . sitaGLIPtin (JANUVIA) 100 MG tablet Take 1 tablet (100 mg total) by mouth daily. 90 tablet 3  . tamsulosin (FLOMAX) 0.4 MG CAPS capsule Take 1 capsule (0.4 mg total) by mouth daily after supper. Until stone passed 15 capsule 0  . triamcinolone (NASACORT ALLERGY 24HR) 55 MCG/ACT AERO nasal inhaler Place 1 spray into the nose daily as needed (allergies).     No current facility-administered medications on file prior to visit.    Allergies  Allergen Reactions  .  Quinolones Other (See Comments)    Patient was warned about not using Cipro and similar antibiotics. Recent studies have raised concern that fluoroquinolone antibiotics could be associated with an increased risk of aortic aneurysm Fluoroquinolones have non-antimicrobial properties that might jeopardise the integrity of the extracellular matrix of the vascular wall In a  propensity score matched cohort study in [place], there was a 66% increased rate of aortic aneurysm or dissection associated with oral fluoroquinolone use, compared wit    Past Medical History:  Diagnosis Date  . Anginal pain ([place])   . Coronary artery disease   . Diabetes mellitus without complication ([place])   . History of kidney stones   . Hypercholesteremia   . MRSA (methicillin resistant staph aureus) culture positive     Past Surgical History:  Procedure Laterality Date  . CARDIAC CATHETERIZATION    . CORONARY ARTERY BYPASS GRAFT N/A 11/21/2018   Procedure: CORONARY ARTERY BYPASS GRAFTING (CABG), ON PUMP, TIMES 4, USING LEFT INTERNAL MAMMARY ARTERY AND RIGHT GREATER SAPHENOUS VEIN, LIMA TO LAD, RIGHT SAPHENOUS VEIN TO OM2, RIGHT SAPHENOUS VEIN TO DISTAL CIRC, RIGHT SAPHENOUS VEIN TO ACUTE MARGINAL;  Surgeon: Grace Isaac, MD;  Location: [facility];  Service: Open Heart Surgery;  Laterality: N/A;  . LEFT HEART CATH AND CORONARY ANGIOGRAPHY N/A 11/14/2018   Procedure: LEFT HEART CATH AND CORONARY ANGIOGRAPHY;  Surgeon: Jettie Booze, MD;  Location: [facility];  Service: Cardiovascular;  Laterality: N/A;  . TEE WITHOUT CARDIOVERSION N/A 11/21/2018   Procedure: TRANSESOPHAGEAL ECHOCARDIOGRAM (TEE);  Surgeon: Grace Isaac, MD;  Location: [facility];  Service: Open Heart Surgery;  Laterality: N/A;  . VASECTOMY    . WISDOM TOOTH EXTRACTION      Social History   Tobacco Use  Smoking Status Never Smoker  Smokeless Tobacco Never Used    Social History   Substance and Sexual Activity  Alcohol Use Yes    Comment: 1 bottle of wine monthly + 2 beers    Family History  Problem Relation Age of Onset  . Heart attack Father   . Stroke Mother   . Healthy Sister   . Healthy Daughter   . Colon cancer Neg Hx     Reviw of Systems:  Reviewed in the HPI.  All other systems are negative.  Physical Exam: Blood pressure 114/72, pulse (!) 52, height _0  (1.702 m), weight 185 lb 8 oz (84.1 kg), SpO2 97 %.  GEN:  Well nourished, well developed in no acute distress HEENT: Normal NECK: No JVD; No carotid bruits LYMPHATICS: No lymphadenopathy CARDIAC: RRR , sternal  incision is healing well. RESPIRATORY:  Clear to auscultation without rales, wheezing or rhonchi  ABDOMEN: Soft, non-tender, non-distended MUSCULOSKELETAL:  No edema; No deformity  SKIN: Warm and dry NEUROLOGIC:  Alert and oriented x 3   ECG:  Assessment / Plan  1.  CAD:   S/p CABG. Thomas Washington is doing great. Continue current medications. We'll decrease his aspirin to 81 mg a day.   I encouraged him to continue with his diet, exercise program. I'll see him again in 1 year. Will check lipids, liver enzymes, basic metabolic profile several days before his next visit.   2.  Hyperlipidemia:    Lipid levels look great. Continue current medications.  Return in 1 year.   Mertie Moores, MD  09/03/2019 9:35 AM    [address],  [place], [place]  [address] Pager [phone] Phone: [phone]; Fax: [phone]

## 2019-09-11 NOTE — Patient Instructions (Signed)
It was a pleasure to see you today and is seeing doing so well status post CABG.  Continue current medications and follow-up in 6 months.

## 2019-10-02 IMAGING — DX CHEST - 2 VIEW
2 series · 2 of 2 positions shown · non-contrast
Comparison: 11/24/2018

CLINICAL DATA: Status post CABG surgery on 11/21/2018. No current
chest complaints.

EXAM:
CHEST - 2 VIEW

[dg chest 2 view (1 of 2)]
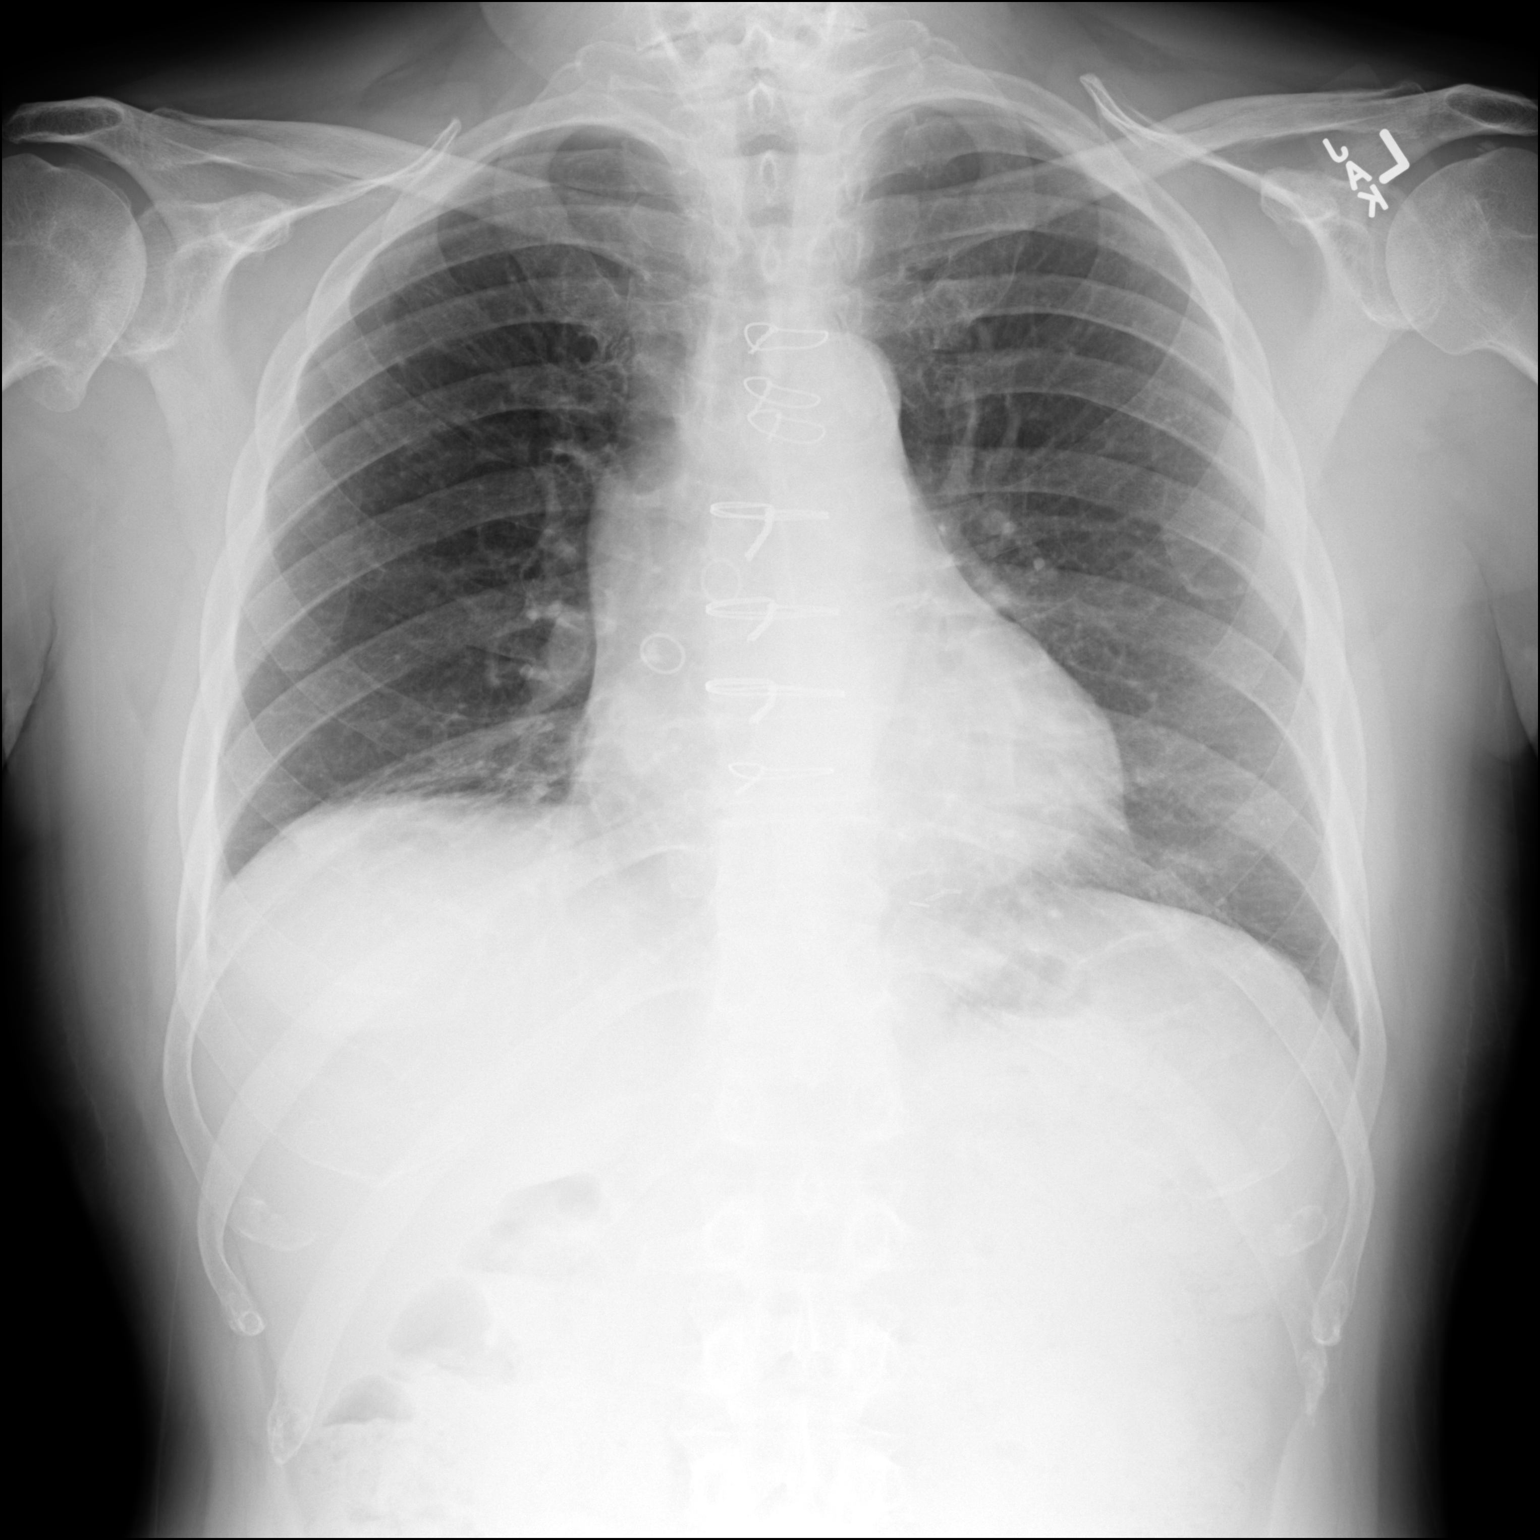

[dg chest 2 view (2 of 2)]
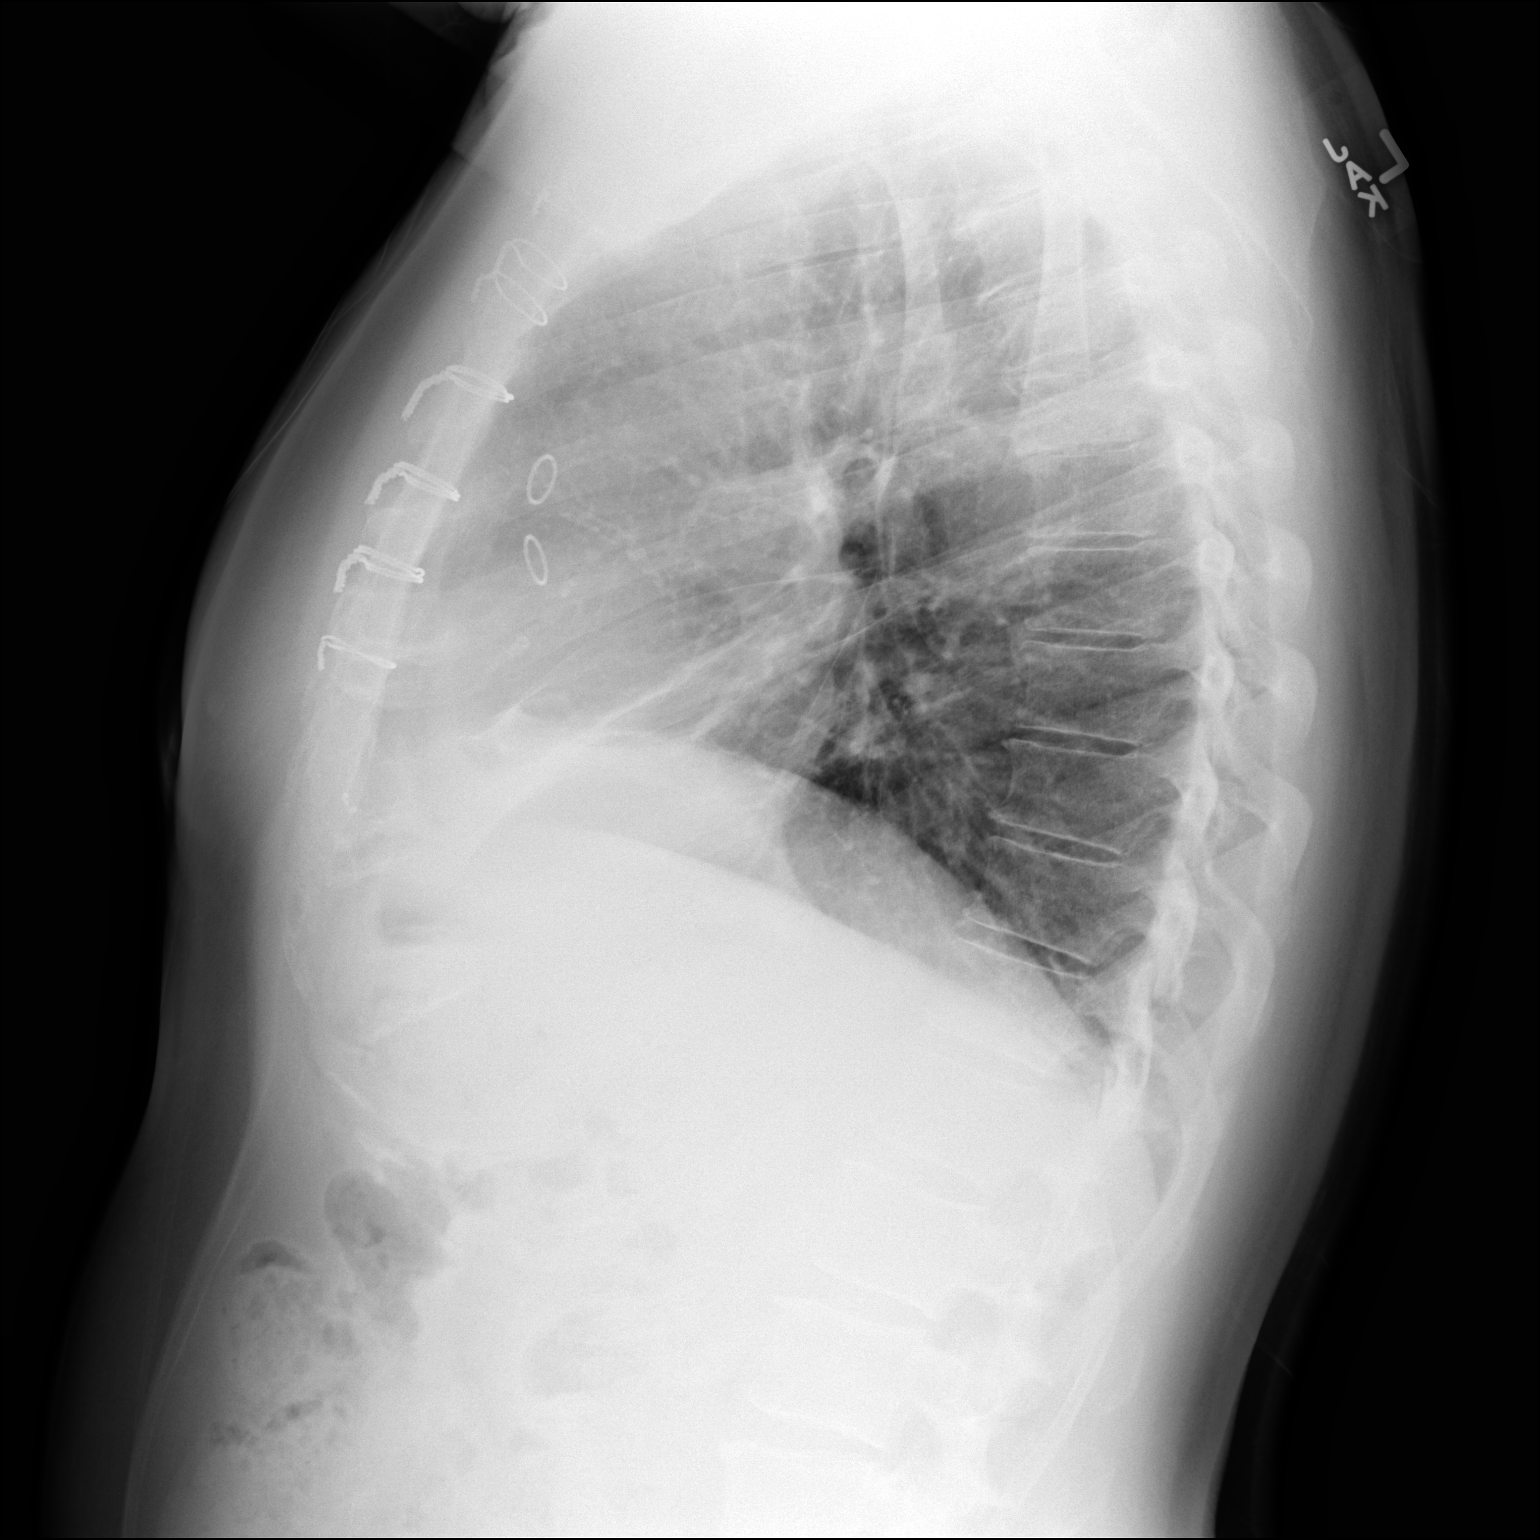

[2 of 2 positions shown; findings below may reference images not displayed]

FINDINGS: Changes from the recent CABG surgery are noted. The cardiac
silhouette is normal in size and configuration. No mediastinal
widening. No mediastinal or hilar masses or evidence of adenopathy.

Linear opacities are noted at the lung bases consistent with
atelectasis. Remainder of the lungs is clear.

Resolved pleural effusions.  No evidence of a pneumothorax.

Skeletal structures are intact.
IMPRESSION: 1. No acute cardiopulmonary disease.
2. Mild residual linear lung base atelectasis. Lungs otherwise
clear.
3. Resolved pleural effusions.

## 2019-12-02 ENCOUNTER — Telehealth: Payer: Self-pay | Admitting: Internal Medicine

## 2019-12-02 DIAGNOSIS — R69 Illness, unspecified: Secondary | ICD-10-CM | POA: Diagnosis not present

## 2019-12-02 MED ORDER — ONETOUCH VERIO VI STRP: ORAL_STRIP | 12 refills | Status: DC

## 2019-12-02 NOTE — Telephone Encounter (Signed)
Writing Rx with prn one year refills

## 2019-12-02 NOTE — Telephone Encounter (Signed)
Received Fax RX request from  [place], [place] Phone:  [phone]  Fax:  [phone]       Medication - One Touch Verio Test strip  Last Refill - 01/22/2019  Last OV - 08/23/19  Last CPE - 08/23/19  Next Appointment - 02/25/20

## 2019-12-03 ENCOUNTER — Encounter: Payer: Self-pay | Admitting: Internal Medicine

## 2019-12-03 DIAGNOSIS — E119 Type 2 diabetes mellitus without complications: Secondary | ICD-10-CM | POA: Diagnosis not present

## 2019-12-03 DIAGNOSIS — H2513 Age-related nuclear cataract, bilateral: Secondary | ICD-10-CM | POA: Diagnosis not present

## 2019-12-03 DIAGNOSIS — H524 Presbyopia: Secondary | ICD-10-CM | POA: Diagnosis not present

## 2019-12-03 DIAGNOSIS — H5213 Myopia, bilateral: Secondary | ICD-10-CM | POA: Diagnosis not present

## 2019-12-03 LAB — HM DIABETES EYE EXAM

## 2019-12-05 DIAGNOSIS — H903 Sensorineural hearing loss, bilateral: Secondary | ICD-10-CM | POA: Diagnosis not present

## 2019-12-09 DIAGNOSIS — Z0100 Encounter for examination of eyes and vision without abnormal findings: Secondary | ICD-10-CM | POA: Diagnosis not present

## 2020-02-08 ENCOUNTER — Other Ambulatory Visit: Payer: Self-pay | Admitting: Cardiovascular Disease

## 2020-02-24 ENCOUNTER — Other Ambulatory Visit: Payer: Medicare HMO | Admitting: Internal Medicine

## 2020-02-24 ENCOUNTER — Other Ambulatory Visit: Payer: Self-pay

## 2020-02-24 DIAGNOSIS — E1169 Type 2 diabetes mellitus with other specified complication: Secondary | ICD-10-CM

## 2020-02-24 DIAGNOSIS — E785 Hyperlipidemia, unspecified: Secondary | ICD-10-CM | POA: Diagnosis not present

## 2020-02-25 ENCOUNTER — Ambulatory Visit (INDEPENDENT_AMBULATORY_CARE_PROVIDER_SITE_OTHER): Payer: Medicare HMO | Admitting: Internal Medicine

## 2020-02-25 VITALS — BP 110/80 | HR 62 | Ht 67.0 in | Wt 171.0 lb

## 2020-02-25 DIAGNOSIS — E782 Mixed hyperlipidemia: Secondary | ICD-10-CM | POA: Diagnosis not present

## 2020-02-25 DIAGNOSIS — E1169 Type 2 diabetes mellitus with other specified complication: Secondary | ICD-10-CM | POA: Diagnosis not present

## 2020-02-25 DIAGNOSIS — E8881 Metabolic syndrome: Secondary | ICD-10-CM | POA: Diagnosis not present

## 2020-02-25 DIAGNOSIS — I10 Essential (primary) hypertension: Secondary | ICD-10-CM

## 2020-02-25 DIAGNOSIS — Z951 Presence of aortocoronary bypass graft: Secondary | ICD-10-CM

## 2020-02-25 DIAGNOSIS — I2510 Atherosclerotic heart disease of native coronary artery without angina pectoris: Secondary | ICD-10-CM | POA: Diagnosis not present

## 2020-02-25 DIAGNOSIS — Z6826 Body mass index (BMI) 26.0-26.9, adult: Secondary | ICD-10-CM

## 2020-02-25 LAB — HEMOGLOBIN A1C
Hgb A1c MFr Bld: 6 % of total Hgb — ABNORMAL HIGH (ref <5.7)
Mean Plasma Glucose: 126 (calc)
eAG (mmol/L): 7 (calc)

## 2020-02-25 NOTE — Progress Notes (Signed)
   Subjective:    Patient ID: Thomas Washington, male    DOB: 09-29-1950, 69 y.o.   MRN: [id number]  HPI 69 year old Male for 6 month follow up on medical issues.Saw Cardiologist in March who thought he was doing great. Follow up recommended in one year.  He has been on several hunting trips.  Continues to be physically active and walks a great deal.  History of coronary artery disease status post CABG in June 2020, history of hyperlipidemia, diabetes mellitus, BPH, essential hypertension, metabolic syndrome and obesity.  Hemoglobin A1c stable at 6% and is even better than it was in March of this year when it was 6.3%.  Lipids were excellent in March.  His HDL is 50.  He is on Crestor 40 mg daily.  He takes Flomax for BPH.  He has had 2 COVID-19 immunizations.  Monitors his Accu-Chek very regularly.  He is on Januvia and Metformin for diabetes mellitus.  Review of Systems See recent Audiology report regarding testing for hearing loss. Repeat study recommended in one year. Not clearly a candidate for hearing aids at this time.     Objective:   Physical Exam Blood pressure 110/80 pulse 62 pulse oximetry 98% weight 171 pounds height 5 feet 7 inches BMI 26.78  Skin warm and dry.  No cervical adenopathy.  No JVD or thyromegaly.  No carotid bruits.  Chest is clear to auscultation without rales or wheezing.  Cardiac exam regular rate and rhythm normal S1 and S2 without murmurs or gallops.  Extremities without pitting edema       Assessment & Plan:  Type 2 diabetes mellitus under excellent control with Januvia and Metformin  History of coronary artery disease status post CABG in June 2020.  He has lost weight, takes care of himself, is physically active and looks great  Essential hypertension-stable on current regimen  BPH treated with Flomax  Hyperlipidemia-lipid panel was normal on statin medication  History of obesity-BMI is excellent at 26.78 and he weighs 171 pounds.  Before  his surgery he weighed 184 pounds and his BMI was 28.82

## 2020-03-09 ENCOUNTER — Encounter: Payer: Self-pay | Admitting: Internal Medicine

## 2020-03-09 NOTE — Patient Instructions (Addendum)
I am very pleased with your progress with weight loss, diet, exercise and lipid as well as glucose control.  Continue current medications.  Follow-up in 6 months for health maintenance exam.  Have Covid booster and annual flu vaccine.

## 2020-03-23 DIAGNOSIS — R69 Illness, unspecified: Secondary | ICD-10-CM | POA: Diagnosis not present

## 2020-04-24 ENCOUNTER — Other Ambulatory Visit: Payer: Self-pay | Admitting: Cardiovascular Disease

## 2020-05-20 DIAGNOSIS — Z20822 Contact with and (suspected) exposure to covid-19: Secondary | ICD-10-CM | POA: Diagnosis not present

## 2020-06-18 DIAGNOSIS — Z0100 Encounter for examination of eyes and vision without abnormal findings: Secondary | ICD-10-CM | POA: Diagnosis not present

## 2020-07-04 ENCOUNTER — Other Ambulatory Visit: Payer: Self-pay | Admitting: Cardiovascular Disease

## 2020-08-24 ENCOUNTER — Other Ambulatory Visit: Payer: Medicare HMO | Admitting: Internal Medicine

## 2020-08-24 ENCOUNTER — Other Ambulatory Visit: Payer: Self-pay

## 2020-08-24 DIAGNOSIS — N401 Enlarged prostate with lower urinary tract symptoms: Secondary | ICD-10-CM

## 2020-08-24 DIAGNOSIS — E1142 Type 2 diabetes mellitus with diabetic polyneuropathy: Secondary | ICD-10-CM | POA: Diagnosis not present

## 2020-08-24 DIAGNOSIS — E785 Hyperlipidemia, unspecified: Secondary | ICD-10-CM

## 2020-08-24 DIAGNOSIS — E782 Mixed hyperlipidemia: Secondary | ICD-10-CM | POA: Diagnosis not present

## 2020-08-24 DIAGNOSIS — R351 Nocturia: Secondary | ICD-10-CM | POA: Diagnosis not present

## 2020-08-24 DIAGNOSIS — Z0000 Encounter for general adult medical examination without abnormal findings: Secondary | ICD-10-CM | POA: Diagnosis not present

## 2020-08-24 DIAGNOSIS — E1169 Type 2 diabetes mellitus with other specified complication: Secondary | ICD-10-CM | POA: Diagnosis not present

## 2020-08-24 DIAGNOSIS — G609 Hereditary and idiopathic neuropathy, unspecified: Secondary | ICD-10-CM

## 2020-08-24 DIAGNOSIS — I7121 Aneurysm of the ascending aorta, without rupture: Secondary | ICD-10-CM

## 2020-08-24 DIAGNOSIS — I712 Thoracic aortic aneurysm, without rupture: Secondary | ICD-10-CM | POA: Diagnosis not present

## 2020-08-24 DIAGNOSIS — I10 Essential (primary) hypertension: Secondary | ICD-10-CM | POA: Diagnosis not present

## 2020-08-24 DIAGNOSIS — E8881 Metabolic syndrome: Secondary | ICD-10-CM

## 2020-08-25 ENCOUNTER — Encounter: Payer: Self-pay | Admitting: Internal Medicine

## 2020-08-25 ENCOUNTER — Ambulatory Visit (INDEPENDENT_AMBULATORY_CARE_PROVIDER_SITE_OTHER): Payer: Medicare HMO | Admitting: Internal Medicine

## 2020-08-25 VITALS — BP 100/60 | HR 61 | Ht 67.0 in | Wt 176.0 lb

## 2020-08-25 DIAGNOSIS — I712 Thoracic aortic aneurysm, without rupture: Secondary | ICD-10-CM

## 2020-08-25 DIAGNOSIS — N401 Enlarged prostate with lower urinary tract symptoms: Secondary | ICD-10-CM

## 2020-08-25 DIAGNOSIS — E1169 Type 2 diabetes mellitus with other specified complication: Secondary | ICD-10-CM

## 2020-08-25 DIAGNOSIS — R351 Nocturia: Secondary | ICD-10-CM

## 2020-08-25 DIAGNOSIS — I10 Essential (primary) hypertension: Secondary | ICD-10-CM | POA: Diagnosis not present

## 2020-08-25 DIAGNOSIS — E8881 Metabolic syndrome: Secondary | ICD-10-CM | POA: Diagnosis not present

## 2020-08-25 DIAGNOSIS — Z951 Presence of aortocoronary bypass graft: Secondary | ICD-10-CM | POA: Diagnosis not present

## 2020-08-25 DIAGNOSIS — Z6827 Body mass index (BMI) 27.0-27.9, adult: Secondary | ICD-10-CM | POA: Diagnosis not present

## 2020-08-25 DIAGNOSIS — Z0000 Encounter for general adult medical examination without abnormal findings: Secondary | ICD-10-CM | POA: Diagnosis not present

## 2020-08-25 DIAGNOSIS — G609 Hereditary and idiopathic neuropathy, unspecified: Secondary | ICD-10-CM

## 2020-08-25 DIAGNOSIS — H903 Sensorineural hearing loss, bilateral: Secondary | ICD-10-CM | POA: Diagnosis not present

## 2020-08-25 DIAGNOSIS — E782 Mixed hyperlipidemia: Secondary | ICD-10-CM

## 2020-08-25 DIAGNOSIS — E785 Hyperlipidemia, unspecified: Secondary | ICD-10-CM | POA: Diagnosis not present

## 2020-08-25 DIAGNOSIS — I7121 Aneurysm of the ascending aorta, without rupture: Secondary | ICD-10-CM

## 2020-08-25 LAB — CBC WITH DIFFERENTIAL/PLATELET
Absolute Monocytes: 791 cells/uL (ref 200–950)
Basophils Absolute: 94 cells/uL (ref 0–200)
Basophils Relative: 1.1 %
Eosinophils Absolute: 111 cells/uL (ref 15–500)
Eosinophils Relative: 1.3 %
HCT: 48 % (ref 38.5–50.0)
Hemoglobin: 15.6 g/dL (ref 13.2–17.1)
Lymphs Abs: 2975 cells/uL (ref 850–3900)
MCH: 29.3 pg (ref 27.0–33.0)
MCHC: 32.5 g/dL (ref 32.0–36.0)
MCV: 90.1 fL (ref 80.0–100.0)
MPV: 10 fL (ref 7.5–12.5)
Monocytes Relative: 9.3 %
Neutro Abs: 4531 cells/uL (ref 1500–7800)
Neutrophils Relative %: 53.3 %
Platelets: 232 10*3/uL (ref 140–400)
RBC: 5.33 10*6/uL (ref 4.20–5.80)
RDW: 13.8 % (ref 11.0–15.0)
Total Lymphocyte: 35 %
WBC: 8.5 10*3/uL (ref 3.8–10.8)

## 2020-08-25 LAB — COMPLETE METABOLIC PANEL WITH GFR
AG Ratio: 1.5 (calc) (ref 1.0–2.5)
ALT: 21 U/L (ref 9–46)
AST: 19 U/L (ref 10–35)
Albumin: 4.5 g/dL (ref 3.6–5.1)
Alkaline phosphatase (APISO): 51 U/L (ref 35–144)
BUN: 17 mg/dL (ref 7–25)
CO2: 29 mmol/L (ref 20–32)
Calcium: 9.8 mg/dL (ref 8.6–10.3)
Chloride: 104 mmol/L (ref 98–110)
Creat: 1.07 mg/dL (ref 0.70–1.25)
GFR, Est African American: 83 mL/min/{1.73_m2} (ref >=60)
GFR, Est Non African American: 71 mL/min/{1.73_m2} (ref >=60)
Globulin: 3.1 g/dL (calc) (ref 1.9–3.7)
Glucose, Bld: 102 mg/dL — ABNORMAL HIGH (ref 65–99)
Potassium: 5 mmol/L (ref 3.5–5.3)
Sodium: 141 mmol/L (ref 135–146)
Total Bilirubin: 0.6 mg/dL (ref 0.2–1.2)
Total Protein: 7.6 g/dL (ref 6.1–8.1)

## 2020-08-25 LAB — HEMOGLOBIN A1C
Hgb A1c MFr Bld: 6 % of total Hgb — ABNORMAL HIGH (ref <5.7)
Mean Plasma Glucose: 126 mg/dL
eAG (mmol/L): 7 mmol/L

## 2020-08-25 LAB — LIPID PANEL
Cholesterol: 136 mg/dL (ref <200)
HDL: 53 mg/dL (ref >=40)
LDL Cholesterol (Calc): 64 mg/dL (calc)
Non-HDL Cholesterol (Calc): 83 mg/dL (calc) (ref <130)
Total CHOL/HDL Ratio: 2.6 (calc) (ref <5.0)
Triglycerides: 107 mg/dL (ref <150)

## 2020-08-25 LAB — POCT URINALYSIS DIPSTICK
Appearance: NEGATIVE
Bilirubin, UA: NEGATIVE
Blood, UA: NEGATIVE
Glucose, UA: NEGATIVE
Ketones, UA: NEGATIVE
Leukocytes, UA: NEGATIVE
Nitrite, UA: NEGATIVE
Odor: NEGATIVE
Protein, UA: POSITIVE — AB
Spec Grav, UA: 1.015 (ref 1.010–1.025)
Urobilinogen, UA: 0.2 E.U./dL
pH, UA: 6.5 (ref 5.0–8.0)

## 2020-08-25 LAB — PSA: PSA: 2.01 ng/mL (ref <=4.0)

## 2020-08-25 LAB — MICROALBUMIN / CREATININE URINE RATIO
Creatinine, Urine: 45 mg/dL (ref 20–320)
Microalb Creat Ratio: 56 mcg/mg creat — ABNORMAL HIGH (ref <30)
Microalb, Ur: 2.5 mg/dL

## 2020-08-25 NOTE — Progress Notes (Signed)
Subjective:    Patient ID: Thomas Washington, male    DOB: [birth year], 70 y.o.   MRN: [id number]  HPI 70 year old Male seen for Medicare wellness and health maintenance exam in addition to evaluation of medical issues.  He remains physically active walking, hunting, skeet shooting.  History of coronary artery disease status post CABG June 2020.  History of hyperlipidemia, diabetes mellitus, BPH, essential hypertension, metabolic syndrome, obesity.  History of BPH treated with Flomax.  His PSA is up slightly from a year ago.  It is now 2.01 and was 1.4.  I really do not make much out of that other than perhaps his prostate is a bit larger.  He has had 3 COVID vaccines.  Tetanus immunization is up-to-date.  Had flu vaccine in October 2021.  Had pneumococcal vaccines before age 54.  His hemoglobin A1c is excellent at 6%.  Lipid panel is normal.  Fasting glucose is excellent at 102.  BUN and creatinine are normal.  Liver functions are normal.  Electrolytes are normal.  Remains on Januvia and Metformin for type 2 diabetes mellitus Crestor generic 40 mg daily for hyperlipidemia.  He had colonoscopy by [name] in October 2018.  Had 2 tubular adenomas and one polyp with benign findings.  History of Candida esophagitis 1991.  Schatzki's ring 1991.  Had acute pericarditis 2014 and was seen by [name].  No known drug allergies.  In June 2021 he underwent coronary artery bypass graft x4 with [name].  He was also found to have a dilated thoracic ascending aorta 4.5 cm in diameter which is being watched.  Has annual diabetic eye exam.  History of hearing loss.  He saw [name], ENT physician who noted there was borderline hearing in the right ear in the low and middle frequencies with downsloping to moderate high-frequency loss.  Left-sided testing demonstrated mild low and middle frequency sensorineural loss with downsloping to moderate high-frequency loss.  Speech discrimination was  normal.  They discussed hearing aids and decided it might not be reasonable right now.  He continues to [place] hot.  He is to return in June for follow-up testing.  Social history: Married with 1 adult daughter.  One grandson.  Another grandchild on the way.  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  Native of [place].  Wife is a retired family Engineer, petroleum.  Family history: Mother with history of stroke died with complications of dementia and stroke.  Father died at age 36 suddenly presumably of a sudden MI.  1 sister in good health.  Has annual diabetic eye exam.          Review of Systems denies chest pain or shortness of breath.     Objective:   Physical Exam Vital signs reviewed.  Blood pressure 100/60 pulse 61 pulse oximetry 96% weight 176 pounds BMI 27.57  Skin: Warm and dry.  Nodes none.  TMs clear.  Neck supple.  No carotid bruits.  Chest clear to auscultation.  Cardiac exam regular rate and rhythm.  Abdomen soft nondistended without hepatosplenomegaly masses or tenderness.  Prostate is without nodules.  No lower extremity pitting edema.  Affect felt judgment are normal.  No focal deficits on brief neurological exam.       Assessment & Plan:  Type 2 diabetes mellitus-stable with Januvia and Metformin  History of coronary artery disease status post CABG June 2020.  He has lost weight takes care of  himself and is physically active.  Hemoglobin A1c is stable.  Essential hypertension-stable on current regimen  BPH treated with Flomax  Hyperlipidemia-lipid panel overall on statin  BMI 27.57 and weight is 176 pounds.  Has gained 5 pounds since September 2021.  Before his surgery, he weighed 184 pounds and his BMI was 28.82.  Hearing loss-is to be retested by [name] later this year.  Plan: Continue current medications and follow-up in 6 months.  Subjective:   Patient presents for Medicare Annual/Subsequent preventive  examination.  Review Past Medical/Family/Social: See above   Risk Factors  Current exercise habits: Physically quite active Dietary issues discussed: Low-fat low carbohydrate  Cardiac risk factors: History of CABG, diabetes mellitus, family history  Depression Screen  (Note: if answer to either of the following is "Yes", a more complete depression screening is indicated)   Over the past two weeks, have you felt down, depressed or hopeless? No  Over the past two weeks, have you felt little interest or pleasure in doing things? No Have you lost interest or pleasure in daily life? No Do you often feel hopeless? No Do you cry easily over simple problems? No   Activities of Daily Living  In your present state of health, do you have any difficulty performing the following activities?:   Driving? No  Managing money? No  Feeding yourself? No  Getting from bed to chair? No  Climbing a flight of stairs? No  Preparing food and eating?: No  Bathing or showering? No  Getting dressed: No  Getting to the toilet? No  Using the toilet:No  Moving around from place to place: No  In the past year have you fallen or had a near fall?:No  Are you sexually active?  No unanswered Do you have more than one partner? No   Hearing Difficulties: Has been tested by [name] Do you often ask people to speak up or repeat themselves?  See note per [name] Do you experience ringing or noises in your ears?  See note per [name] Do you have difficulty understanding soft or whispered voices?  History of high-frequency hearing loss Do you feel that you have a problem with memory? No Do you often misplace items? No    Home Safety:  Do you have a smoke alarm at your residence? Yes Do you have grab bars in the bathroom? Do you have throw rugs in your house?   Cognitive Testing  Alert? Yes Normal Appearance?Yes  Oriented to person? Yes Place? Yes  Time? Yes  Recall of three objects? Yes  Can  perform simple calculations? Yes  Displays appropriate judgment?Yes  Can read the correct time from a watch face?Yes   List the Names of Other Physician/Practitioners you currently use:  See referral list for the physicians patient is currently seeing.     Review of Systems: See above   Objective:     General appearance: Appears younger than stated age  Head: Normocephalic, without obvious abnormality, atraumatic  Eyes: conj clear, EOMi PEERLA  Ears: normal TM's and external ear canals both ears  Nose: Nares normal. Septum midline. Mucosa normal. No drainage or sinus tenderness.  Throat: lips, mucosa, and tongue normal; teeth and gums normal  Neck: no adenopathy, no carotid bruit, no JVD, supple, symmetrical, trachea midline and thyroid not enlarged, symmetric, no tenderness/mass/nodules  No CVA tenderness.  Lungs: clear to auscultation bilaterally  Breasts: normal appearance, no masses or tenderness Heart: regular rate and rhythm, S1, S2  normal, no murmur, click, rub or gallop  Abdomen: soft, non-tender; bowel sounds normal; no masses, no organomegaly  Musculoskeletal: ROM normal in all joints, no crepitus, no deformity, Normal muscle strengthen. Back  is symmetric, no curvature. Skin: Skin color, texture, turgor normal. No rashes or lesions  Lymph nodes: Cervical, supraclavicular, and axillary nodes normal.  Neurologic: CN 2 -12 Normal, Normal symmetric reflexes. Normal coordination and gait  Psych: Alert & Oriented x 3, Mood appear stable.    Assessment:    Annual wellness medicare exam   Plan:    During the course of the visit the patient was educated and counseled about appropriate screening and preventive services including:   See above     Patient Instructions (the written plan) was given to the patient.  Medicare Attestation  I have personally reviewed:  The patient's medical and social history  Their use of alcohol, tobacco or illicit drugs  Their current  medications and supplements  The patient's functional ability including ADLs,fall risks, home safety risks, cognitive, and hearing and visual impairment  Diet and physical activities  Evidence for depression or mood disorders  The patient's weight, height, BMI, and visual acuity have been recorded in the chart. I have made referrals, counseling, and provided education to the patient based on review of the above and I have provided the patient with a written personalized care plan for preventive services.

## 2020-08-30 ENCOUNTER — Other Ambulatory Visit: Payer: Self-pay | Admitting: Internal Medicine

## 2020-08-31 ENCOUNTER — Other Ambulatory Visit: Payer: Self-pay

## 2020-08-31 ENCOUNTER — Ambulatory Visit: Payer: Medicare HMO | Admitting: Cardiovascular Disease

## 2020-08-31 ENCOUNTER — Encounter: Payer: Self-pay | Admitting: Cardiovascular Disease

## 2020-08-31 VITALS — BP 112/74 | HR 53 | Ht 67.0 in | Wt 177.2 lb

## 2020-08-31 DIAGNOSIS — E785 Hyperlipidemia, unspecified: Secondary | ICD-10-CM | POA: Diagnosis not present

## 2020-08-31 DIAGNOSIS — I2510 Atherosclerotic heart disease of native coronary artery without angina pectoris: Secondary | ICD-10-CM | POA: Diagnosis not present

## 2020-08-31 MED ORDER — METOPROLOL TARTRATE 25 MG PO TABS: 12.5000 mg | ORAL_TABLET | Freq: Two times a day (BID) | ORAL | 3 refills | Status: DC

## 2020-08-31 MED ORDER — ROSUVASTATIN CALCIUM 40 MG PO TABS: 40.0000 mg | ORAL_TABLET | Freq: Every day | ORAL | 3 refills | Status: DC

## 2020-08-31 NOTE — Patient Instructions (Signed)

## 2020-08-31 NOTE — Progress Notes (Signed)
Thomas Washington Date of Birth  [birth year]       [facility] [address] [place] [address], [place], [place]  [address]   [place], [place]  [address] [phone]     [phone]   Fax  [phone]    Fax [phone]  Problem List: 1. Pericarditis 2. Hyperlipidemia 3. Hypertension 4. Diabetes mellitus   Previous Notes.  Thomas Washington is a 70 yo who developed CP recently 2 days ago .  Very tender to the touch.  Felt like rib pain.  Thought it was indigestion initially.   Was positional , chest pain eased with sitting forward.  Worse with deep breath.  He drove himself to the ER. D-dimer was normal < 0.27.  Troponin was normal.  He took Motrin and feels much better.    + cough, ( dry)   No fevers, no chills, no  Some dyspnea with walking.  Strong family history of CAD - father died at age 39 of MI.   He works as a Software engineer at [employer]. His glucose control is pretty good.  HbA1c is 6.9  He had a stress myoview which was normal. An echocardiogram which revealed normal left ventricular systolic function. He was found to have some diastolic dysfunction.  His back doing all of his normal activities without difficulty.  Sept. 14, 2020:  Thomas Washington is seen today for follow-up visit.  He was found to have coronary artery disease.  Is admitted in early June and had coronary artery bypass grafting. Doing great.  No angina .   Still some MSK pain  Exercising well   September 03, 2019:  Thomas Washington is seen today for follow-up visit.  He has a history of coronary artery disease and coronary artery bypass grafting in June, 2020. Exercising regularly .  No CP,  No chest wall tenderness.    Planning on going bear hunting in [place] in several months .  Is tolerating the metoprolol better now.   Reviewed lipids - numbers look great.   August 31, 2020:   Thomas Washington is seen today for follow up for his CAD, CABG in June 2020   Has not been exercising  Some yard  work ,   Lawyer a free standing tree stand in [place]   Current Outpatient Medications on File Prior to Visit  Medication Sig Dispense Refill  . ACCU-CHEK FASTCLIX LANCETS MISC Use as instructed to test blood sugar daily 100 each 2  . acetaminophen (TYLENOL) 500 MG tablet Take 2 tablets (1,000 mg total) by mouth every 6 (six) hours. 30 tablet 0  . aspirin EC 81 MG tablet Take 1 tablet (81 mg total) by mouth daily.    . B Complex Vitamins (B COMPLEX PO) Take 1 tablet by mouth daily.     . Blood Glucose Monitoring Suppl (ACCU-CHEK AVIVA PLUS) w/Device KIT Use as instructed to test blood sugar daily 1 kit 0  . calcium carbonate (TUMS - DOSED IN MG ELEMENTAL CALCIUM) 500 MG chewable tablet Chew 2 tablets by mouth daily as needed for indigestion or heartburn.    . Cholecalciferol (VITAMIN D3) 250 MCG (10000 UT) capsule Take 10,000 Units by mouth at bedtime.    . Cyanocobalamin (B-12) 5000 MCG CAPS Take 5,000 mcg by mouth at bedtime.    . folic acid (FOLVITE) 062 MCG tablet Take 800 mcg by mouth daily.    Marland Kitchen glucose blood (ACCU-CHEK ACTIVE STRIPS) test  strip Use as instructed to test blood sugar daily 100 each 2  . glucose blood (ONETOUCH VERIO) test strip Use daily as directed. 100 each 12  . JANUVIA 100 MG tablet TAKE 1 TABLET BY MOUTH EVERY DAY 90 tablet 3  . metFORMIN (GLUCOPHAGE) 1000 MG tablet Take 0.5-1 tablets (500-1,000 mg total) by mouth See admin instructions. Take 500 mg in the morning and 1000 mg in the evening    . tamsulosin (FLOMAX) 0.4 MG CAPS capsule Take 0.4 mg by mouth daily after supper.    . triamcinolone (NASACORT) 55 MCG/ACT AERO nasal inhaler Place 1 spray into the nose daily as needed (allergies).     No current facility-administered medications on file prior to visit.    Allergies  Allergen Reactions  . Quinolones Other (See Comments)    Patient was warned about not using Cipro and similar antibiotics. Recent studies have raised concern that fluoroquinolone  antibiotics could be associated with an increased risk of aortic aneurysm Fluoroquinolones have non-antimicrobial properties that might jeopardise the integrity of the extracellular matrix of the vascular wall In a  propensity score matched cohort study in [place], there was a 66% increased rate of aortic aneurysm or dissection associated with oral fluoroquinolone use, compared wit    Past Medical History:  Diagnosis Date  . Anginal pain ([facility])   . Coronary artery disease   . Diabetes mellitus without complication ([facility])   . History of kidney stones   . Hypercholesteremia   . MRSA (methicillin resistant staph aureus) culture positive     Past Surgical History:  Procedure Laterality Date  . CARDIAC CATHETERIZATION    . CORONARY ARTERY BYPASS GRAFT N/A 11/21/2018   Procedure: CORONARY ARTERY BYPASS GRAFTING (CABG), ON PUMP, TIMES 4, USING LEFT INTERNAL MAMMARY ARTERY AND RIGHT GREATER SAPHENOUS VEIN, LIMA TO LAD, RIGHT SAPHENOUS VEIN TO OM2, RIGHT SAPHENOUS VEIN TO DISTAL CIRC, RIGHT SAPHENOUS VEIN TO ACUTE MARGINAL;  Surgeon: Grace Isaac, MD;  Location: [facility];  Service: Open Heart Surgery;  Laterality: N/A;  . LEFT HEART CATH AND CORONARY ANGIOGRAPHY N/A 11/14/2018   Procedure: LEFT HEART CATH AND CORONARY ANGIOGRAPHY;  Surgeon: Jettie Booze, MD;  Location: [facility];  Service: Cardiovascular;  Laterality: N/A;  . TEE WITHOUT CARDIOVERSION N/A 11/21/2018   Procedure: TRANSESOPHAGEAL ECHOCARDIOGRAM (TEE);  Surgeon: Grace Isaac, MD;  Location: [facility];  Service: Open Heart Surgery;  Laterality: N/A;  . VASECTOMY    . WISDOM TOOTH EXTRACTION      Social History   Tobacco Use  Smoking Status Never Smoker  Smokeless Tobacco Never Used    Social History   Substance and Sexual Activity  Alcohol Use Yes   Comment: 1 bottle of wine monthly + 2 beers    Family History  Problem Relation Age of Onset  . Heart attack Father   . Stroke Mother   . Healthy Sister    . Healthy Daughter   . Colon cancer Neg Hx     Reviw of Systems:  Reviewed in the HPI.  All other systems are negative.   Physical Exam: Blood pressure 112/74, pulse (!) 53, height '5\' 7"'  (1.702 m), weight 177 lb 3.2 oz (80.4 kg), SpO2 98 %.  GEN:  Well nourished, well developed in no acute distress HEENT: Normal NECK: No JVD; No carotid bruits LYMPHATICS: No lymphadenopathy CARDIAC: RRR , no murmurs, rubs, gallops RESPIRATORY:  Clear to auscultation without rales, wheezing or rhonchi  ABDOMEN: Soft, non-tender, non-distended  MUSCULOSKELETAL:  No edema; No deformity  SKIN: Warm and dry NEUROLOGIC:  Alert and oriented x 3   ECG: August 31, 2020: Sinus bradycardia 53.  No ST or T wave changes.  Assessment / Plan  1.  CAD:   S/p CABG.    No angina .  Continues to do well   2.  Hyperlipidemia:     Lipids from last week look great  Cont current meds.  Advised him to walk more    Return in 1 year.   Mertie Moores, MD  08/31/2020 9:45 AM    [address],  [place], [place]  [address] Pager [phone] Phone: [phone]; Fax: [phone]

## 2020-09-09 ENCOUNTER — Encounter: Payer: Self-pay | Admitting: Internal Medicine

## 2020-09-09 NOTE — Patient Instructions (Signed)
It was a pleasure to see you today.  Continue current medications and follow-up in 6 months.

## 2020-11-26 ENCOUNTER — Other Ambulatory Visit: Payer: Self-pay

## 2020-11-26 ENCOUNTER — Other Ambulatory Visit: Payer: Medicare HMO | Admitting: Internal Medicine

## 2020-11-26 DIAGNOSIS — N401 Enlarged prostate with lower urinary tract symptoms: Secondary | ICD-10-CM

## 2020-11-26 DIAGNOSIS — Z125 Encounter for screening for malignant neoplasm of prostate: Secondary | ICD-10-CM

## 2020-11-26 DIAGNOSIS — R351 Nocturia: Secondary | ICD-10-CM | POA: Diagnosis not present

## 2020-11-27 LAB — PSA: PSA: 1.95 ng/mL (ref <=4.00)

## 2020-11-27 NOTE — Progress Notes (Signed)
Left detailed message.   

## 2020-12-01 ENCOUNTER — Encounter: Payer: Self-pay | Admitting: Internal Medicine

## 2020-12-01 ENCOUNTER — Ambulatory Visit (INDEPENDENT_AMBULATORY_CARE_PROVIDER_SITE_OTHER): Payer: Medicare HMO | Admitting: Internal Medicine

## 2020-12-01 ENCOUNTER — Other Ambulatory Visit: Payer: Self-pay

## 2020-12-01 VITALS — BP 100/80 | HR 58 | Ht 67.0 in | Wt 175.0 lb

## 2020-12-01 DIAGNOSIS — E119 Type 2 diabetes mellitus without complications: Secondary | ICD-10-CM

## 2020-12-01 DIAGNOSIS — I10 Essential (primary) hypertension: Secondary | ICD-10-CM

## 2020-12-01 DIAGNOSIS — Z951 Presence of aortocoronary bypass graft: Secondary | ICD-10-CM

## 2020-12-01 DIAGNOSIS — R351 Nocturia: Secondary | ICD-10-CM | POA: Diagnosis not present

## 2020-12-01 DIAGNOSIS — N401 Enlarged prostate with lower urinary tract symptoms: Secondary | ICD-10-CM | POA: Diagnosis not present

## 2020-12-01 NOTE — Patient Instructions (Addendum)
PSA is stable.  Follow-up in September.  Prescription written as requested for Janumet

## 2020-12-01 NOTE — Progress Notes (Signed)
   Subjective:    Patient ID: Thomas Washington, male    DOB: May 28, 1951, 70 y.o.   MRN: [id number]  HPI 70 year old Male seen today to follow up on PSA.Has more urinary frequency than he used to. Likely has BPH and has been prescribed Flomax previously. PSA 3 months ago was 2.01 up from 1.4 a year previously. Repeated recently and was 1.95. We have agreed to continue to follow this. Has not increased dramatically. Will continue to monitor annually unless develops other symptoms.  Written Rx for Januvia at his request.  Generally feels well and looking forward to some trips with wife. Has new granddaughter and will spend Fourth of July with daughter and her family in [place] area.  Tdap up to date as are pneumococcal vaccines  Review of Systems see above-no new complaints     Objective:   Physical Exam BP 100/80 pulse 58 pulse ox 97% weight 175 pounds BMI 27.41  Spent 10 minutes today speaking about his lab results     Assessment & Plan:   BPH-tolerates urinary frequency okay.  Type 2 diabetes mellitus-stable with Januvia and metformin  History of coronary disease status post CABG treated with Crestor and metoprolol  Plan: He will return in September for 10-month recheck appointment.  His annual Medicare wellness visit was performed in March.

## 2020-12-03 DIAGNOSIS — E119 Type 2 diabetes mellitus without complications: Secondary | ICD-10-CM | POA: Diagnosis not present

## 2020-12-03 DIAGNOSIS — H5213 Myopia, bilateral: Secondary | ICD-10-CM | POA: Diagnosis not present

## 2020-12-03 LAB — HM DIABETES EYE EXAM

## 2020-12-07 ENCOUNTER — Encounter: Payer: Self-pay | Admitting: Internal Medicine

## 2021-02-21 ENCOUNTER — Other Ambulatory Visit: Payer: Self-pay | Admitting: Internal Medicine

## 2021-03-02 ENCOUNTER — Other Ambulatory Visit: Payer: Self-pay

## 2021-03-02 ENCOUNTER — Other Ambulatory Visit: Payer: Medicare HMO | Admitting: Internal Medicine

## 2021-03-02 DIAGNOSIS — I10 Essential (primary) hypertension: Secondary | ICD-10-CM

## 2021-03-02 DIAGNOSIS — E1169 Type 2 diabetes mellitus with other specified complication: Secondary | ICD-10-CM | POA: Diagnosis not present

## 2021-03-02 DIAGNOSIS — E119 Type 2 diabetes mellitus without complications: Secondary | ICD-10-CM | POA: Diagnosis not present

## 2021-03-02 DIAGNOSIS — E785 Hyperlipidemia, unspecified: Secondary | ICD-10-CM | POA: Diagnosis not present

## 2021-03-03 LAB — LIPID PANEL
Cholesterol: 132 mg/dL (ref <200)
HDL: 57 mg/dL (ref >=40)
LDL Cholesterol (Calc): 59 mg/dL (calc)
Non-HDL Cholesterol (Calc): 75 mg/dL (calc) (ref <130)
Total CHOL/HDL Ratio: 2.3 (calc) (ref <5.0)
Triglycerides: 81 mg/dL (ref <150)

## 2021-03-03 LAB — HEPATIC FUNCTION PANEL
AG Ratio: 1.8 (calc) (ref 1.0–2.5)
ALT: 22 U/L (ref 9–46)
AST: 21 U/L (ref 10–35)
Albumin: 4.7 g/dL (ref 3.6–5.1)
Alkaline phosphatase (APISO): 43 U/L (ref 35–144)
Bilirubin, Direct: 0.1 mg/dL (ref 0.0–0.2)
Globulin: 2.6 g/dL (calc) (ref 1.9–3.7)
Indirect Bilirubin: 0.5 mg/dL (calc) (ref 0.2–1.2)
Total Bilirubin: 0.6 mg/dL (ref 0.2–1.2)
Total Protein: 7.3 g/dL (ref 6.1–8.1)

## 2021-03-03 LAB — HEMOGLOBIN A1C
Hgb A1c MFr Bld: 6 % of total Hgb — ABNORMAL HIGH (ref <5.7)
Mean Plasma Glucose: 126 mg/dL
eAG (mmol/L): 7 mmol/L

## 2021-03-04 ENCOUNTER — Other Ambulatory Visit: Payer: Medicare HMO | Admitting: Internal Medicine

## 2021-03-05 ENCOUNTER — Other Ambulatory Visit: Payer: Self-pay

## 2021-03-05 ENCOUNTER — Encounter: Payer: Self-pay | Admitting: Internal Medicine

## 2021-03-05 ENCOUNTER — Ambulatory Visit (INDEPENDENT_AMBULATORY_CARE_PROVIDER_SITE_OTHER): Payer: Medicare HMO | Admitting: Internal Medicine

## 2021-03-05 VITALS — BP 128/74 | HR 56 | Resp 16 | Ht 67.0 in | Wt 174.0 lb

## 2021-03-05 DIAGNOSIS — E785 Hyperlipidemia, unspecified: Secondary | ICD-10-CM

## 2021-03-05 DIAGNOSIS — E1169 Type 2 diabetes mellitus with other specified complication: Secondary | ICD-10-CM | POA: Diagnosis not present

## 2021-03-05 DIAGNOSIS — Z951 Presence of aortocoronary bypass graft: Secondary | ICD-10-CM | POA: Diagnosis not present

## 2021-03-05 DIAGNOSIS — B351 Tinea unguium: Secondary | ICD-10-CM

## 2021-03-05 DIAGNOSIS — Z6827 Body mass index (BMI) 27.0-27.9, adult: Secondary | ICD-10-CM | POA: Diagnosis not present

## 2021-03-05 DIAGNOSIS — I10 Essential (primary) hypertension: Secondary | ICD-10-CM | POA: Diagnosis not present

## 2021-03-05 MED ORDER — TERBINAFINE HCL 250 MG PO TABS: 250.0000 mg | ORAL_TABLET | Freq: Every day | ORAL | 0 refills | Status: DC

## 2021-03-05 NOTE — Patient Instructions (Signed)
Start Lamisil 250 mg daily and follow-up in 4 weeks.  Will need CBC and liver functions at that time.  Diabetes is under excellent control with Januvia and metformin.  Hyperlipidemia controlled on statin medication.  Continue metoprolol prescribed by [name].  Weight is stable.  Continue your current exercise habits which are excellent.

## 2021-03-05 NOTE — Progress Notes (Signed)
   Subjective:    Patient ID: Thomas Washington, male    DOB: 12-12-1950, 70 y.o.   MRN: [id number]  HPI 70 year old Male with Type 2 diabetes mellitus here for 6 month follow up. Has been traveling with wife to the [place]. Hiking and getting lots of exercise.   Hemoglobin A1c is excellent at 6%.  Lipid panel is entirely normal on statin medication.  Liver functions are normal as well.  He feels well and has no new complaints except for possible toenail fungus.  He used to get pedicures.  Recently noticed some discoloration and thickening of great toenails.     Review of Systems see above-denies chest pain or shortness of breath.  History of CABG.  Doing well from cardiac standpoint.     Objective:   Physical Exam BP 128/74, pulse 56, RR 16, pulse ox 96%, BMI 27.25 Skin warm and dry.  Chest clear.  Cardiac exam: Regular rate and rhythm.  No lower extremity pitting edema.  Has some very mild discoloration and thickening of toenails      Assessment & Plan:  Onychomycosis-try Lamisil 250 mg daily and return in 4 weeks for follow-up.  Will need CBC and liver functions at that time.  Type 2 diabetes mellitus-stable on current regimen of Januvia and metformin  Hyperlipidemia-stable on statin  Status post CABG-[name] has him on metoprolol 25 mg twice daily and blood pressure is stable  History of BPH treated with Flomax

## 2021-03-29 ENCOUNTER — Other Ambulatory Visit: Payer: Medicare HMO | Admitting: Internal Medicine

## 2021-03-29 ENCOUNTER — Other Ambulatory Visit: Payer: Self-pay

## 2021-03-29 DIAGNOSIS — I10 Essential (primary) hypertension: Secondary | ICD-10-CM | POA: Diagnosis not present

## 2021-03-29 LAB — CBC WITH DIFFERENTIAL/PLATELET
Absolute Monocytes: 747 cells/uL (ref 200–950)
Basophils Absolute: 69 cells/uL (ref 0–200)
Basophils Relative: 0.9 %
Eosinophils Absolute: 139 cells/uL (ref 15–500)
Eosinophils Relative: 1.8 %
HCT: 47.6 % (ref 38.5–50.0)
Hemoglobin: 15.6 g/dL (ref 13.2–17.1)
Lymphs Abs: 2980 cells/uL (ref 850–3900)
MCH: 29.4 pg (ref 27.0–33.0)
MCHC: 32.8 g/dL (ref 32.0–36.0)
MCV: 89.8 fL (ref 80.0–100.0)
MPV: 10.1 fL (ref 7.5–12.5)
Monocytes Relative: 9.7 %
Neutro Abs: 3765 cells/uL (ref 1500–7800)
Neutrophils Relative %: 48.9 %
Platelets: 227 10*3/uL (ref 140–400)
RBC: 5.3 10*6/uL (ref 4.20–5.80)
RDW: 13.6 % (ref 11.0–15.0)
Total Lymphocyte: 38.7 %
WBC: 7.7 10*3/uL (ref 3.8–10.8)

## 2021-03-31 ENCOUNTER — Other Ambulatory Visit: Payer: Self-pay

## 2021-03-31 MED ORDER — TERBINAFINE HCL 250 MG PO TABS: 250.0000 mg | ORAL_TABLET | Freq: Every day | ORAL | 1 refills | Status: DC

## 2021-03-31 NOTE — Progress Notes (Signed)
Refill of lamisil sent in per provider request #30 with 1 refill.

## 2021-04-01 ENCOUNTER — Other Ambulatory Visit: Payer: Medicare HMO | Admitting: Internal Medicine

## 2021-04-02 ENCOUNTER — Telehealth: Payer: Self-pay | Admitting: Internal Medicine

## 2021-04-02 NOTE — Telephone Encounter (Addendum)
Clair Gulling called back, he is in [place], so we can get labs on Monday we he comes in for his office visit, or should we reschedule?

## 2021-04-02 NOTE — Telephone Encounter (Signed)
LVM to CB so that he could come by today and get a no fasting lab (liver) that we accidentally left off on Monday.

## 2021-04-05 ENCOUNTER — Other Ambulatory Visit: Payer: Self-pay

## 2021-04-05 ENCOUNTER — Encounter: Payer: Self-pay | Admitting: Internal Medicine

## 2021-04-05 ENCOUNTER — Ambulatory Visit (INDEPENDENT_AMBULATORY_CARE_PROVIDER_SITE_OTHER): Payer: Medicare HMO | Admitting: Internal Medicine

## 2021-04-05 VITALS — BP 110/64 | HR 47 | Temp 97.8°F | Ht 67.0 in | Wt 182.0 lb

## 2021-04-05 DIAGNOSIS — B351 Tinea unguium: Secondary | ICD-10-CM | POA: Diagnosis not present

## 2021-04-05 MED ORDER — TERBINAFINE HCL 250 MG PO TABS: 250.0000 mg | ORAL_TABLET | Freq: Every day | ORAL | 0 refills | Status: DC

## 2021-04-05 NOTE — Progress Notes (Signed)
   Subjective:    Patient ID: Thomas Washington, male    DOB: Feb 15, 1951, 70 y.o.   MRN: [id number]  HPI 70 year old Male for follow up on onychomycosis. Has been on Lamisil. CBC was checked recently but we overlooked getting Liver panel so that will be done today. Enjoyed spending weekend with wife traveling to [place], [place].    Review of Systems no new complaints Has CPE in the Spring     Objective:   Physical Exam BP excellent at 110/64; pulse is 47 but asymptomatic.  Considerable improvement in appearance of great toenail with only one month Lamisil orally. CBC with diff is OK Liver panel drawn today.     Assessment & Plan:   Onychomycosis great toe nail.Has had 4 weeks of Lamisil and is improving. Take an additional 4 weeks of Lamisil (has refill) then see if nail will grow out healthy.This could take a few months. I have sent an additional refill today to have on hand if needed. Generally, I do not treat this for more than 3 months with Lamisil.

## 2021-04-05 NOTE — Patient Instructions (Signed)
Liver panel drawn today. Will take another month of Lamisil. CBC was OK. Then hold on to third course of Lamisil to have if needed.

## 2021-04-06 LAB — COMPLETE METABOLIC PANEL WITH GFR
AG Ratio: 1.6 (calc) (ref 1.0–2.5)
ALT: 17 U/L (ref 9–46)
AST: 23 U/L (ref 10–35)
Albumin: 4.2 g/dL (ref 3.6–5.1)
Alkaline phosphatase (APISO): 43 U/L (ref 35–144)
BUN: 12 mg/dL (ref 7–25)
CO2: 28 mmol/L (ref 20–32)
Calcium: 9.6 mg/dL (ref 8.6–10.3)
Chloride: 104 mmol/L (ref 98–110)
Creat: 0.99 mg/dL (ref 0.70–1.35)
Globulin: 2.7 g/dL (calc) (ref 1.9–3.7)
Glucose, Bld: 106 mg/dL (ref 65–139)
Potassium: 4.6 mmol/L (ref 3.5–5.3)
Sodium: 140 mmol/L (ref 135–146)
Total Bilirubin: 0.5 mg/dL (ref 0.2–1.2)
Total Protein: 6.9 g/dL (ref 6.1–8.1)
eGFR: 83 mL/min/{1.73_m2} (ref >=60)

## 2021-04-23 ENCOUNTER — Telehealth: Payer: Self-pay

## 2021-04-23 NOTE — Telephone Encounter (Signed)
Note patient hasnt filled januvia since 12/15/20 for #30. I spoke with the patient. He gets it from [place] and gets a year supply cheaper than he gets a month here. He is taking it daily.

## 2021-08-18 ENCOUNTER — Other Ambulatory Visit: Payer: Self-pay | Admitting: Cardiovascular Disease

## 2021-08-24 ENCOUNTER — Other Ambulatory Visit: Payer: Self-pay

## 2021-08-24 ENCOUNTER — Other Ambulatory Visit: Payer: Medicare HMO | Admitting: Internal Medicine

## 2021-08-24 DIAGNOSIS — I10 Essential (primary) hypertension: Secondary | ICD-10-CM

## 2021-08-24 DIAGNOSIS — E1169 Type 2 diabetes mellitus with other specified complication: Secondary | ICD-10-CM

## 2021-08-24 DIAGNOSIS — R351 Nocturia: Secondary | ICD-10-CM | POA: Diagnosis not present

## 2021-08-24 DIAGNOSIS — E119 Type 2 diabetes mellitus without complications: Secondary | ICD-10-CM

## 2021-08-24 DIAGNOSIS — N401 Enlarged prostate with lower urinary tract symptoms: Secondary | ICD-10-CM

## 2021-08-24 DIAGNOSIS — E785 Hyperlipidemia, unspecified: Secondary | ICD-10-CM

## 2021-08-25 LAB — COMPLETE METABOLIC PANEL WITH GFR
AG Ratio: 1.4 (calc) (ref 1.0–2.5)
ALT: 17 U/L (ref 9–46)
AST: 18 U/L (ref 10–35)
Albumin: 4.3 g/dL (ref 3.6–5.1)
Alkaline phosphatase (APISO): 47 U/L (ref 35–144)
BUN: 13 mg/dL (ref 7–25)
CO2: 28 mmol/L (ref 20–32)
Calcium: 9.4 mg/dL (ref 8.6–10.3)
Chloride: 103 mmol/L (ref 98–110)
Creat: 1.07 mg/dL (ref 0.70–1.35)
Globulin: 3 g/dL (calc) (ref 1.9–3.7)
Glucose, Bld: 122 mg/dL — ABNORMAL HIGH (ref 65–99)
Potassium: 4.7 mmol/L (ref 3.5–5.3)
Sodium: 141 mmol/L (ref 135–146)
Total Bilirubin: 0.5 mg/dL (ref 0.2–1.2)
Total Protein: 7.3 g/dL (ref 6.1–8.1)
eGFR: 76 mL/min/{1.73_m2} (ref >=60)

## 2021-08-25 LAB — LIPID PANEL
Cholesterol: 142 mg/dL (ref <200)
HDL: 57 mg/dL (ref >=40)
LDL Cholesterol (Calc): 63 mg/dL (calc)
Non-HDL Cholesterol (Calc): 85 mg/dL (calc) (ref <130)
Total CHOL/HDL Ratio: 2.5 (calc) (ref <5.0)
Triglycerides: 135 mg/dL (ref <150)

## 2021-08-25 LAB — CBC WITH DIFFERENTIAL/PLATELET
Absolute Monocytes: 730 cells/uL (ref 200–950)
Basophils Absolute: 60 cells/uL (ref 0–200)
Basophils Relative: 0.9 %
Eosinophils Absolute: 101 cells/uL (ref 15–500)
Eosinophils Relative: 1.5 %
HCT: 48.2 % (ref 38.5–50.0)
Hemoglobin: 15.5 g/dL (ref 13.2–17.1)
Lymphs Abs: 2285 cells/uL (ref 850–3900)
MCH: 29 pg (ref 27.0–33.0)
MCHC: 32.2 g/dL (ref 32.0–36.0)
MCV: 90.1 fL (ref 80.0–100.0)
MPV: 10.1 fL (ref 7.5–12.5)
Monocytes Relative: 10.9 %
Neutro Abs: 3524 cells/uL (ref 1500–7800)
Neutrophils Relative %: 52.6 %
Platelets: 255 10*3/uL (ref 140–400)
RBC: 5.35 10*6/uL (ref 4.20–5.80)
RDW: 13.4 % (ref 11.0–15.0)
Total Lymphocyte: 34.1 %
WBC: 6.7 10*3/uL (ref 3.8–10.8)

## 2021-08-25 LAB — MICROALBUMIN, URINE: Microalb, Ur: 4.2 mg/dL

## 2021-08-25 LAB — HEMOGLOBIN A1C
Hgb A1c MFr Bld: 6.3 % of total Hgb — ABNORMAL HIGH (ref <5.7)
Mean Plasma Glucose: 134 mg/dL
eAG (mmol/L): 7.4 mmol/L

## 2021-08-25 LAB — PSA: PSA: 2.31 ng/mL (ref <=4.00)

## 2021-08-27 ENCOUNTER — Ambulatory Visit (INDEPENDENT_AMBULATORY_CARE_PROVIDER_SITE_OTHER): Payer: Medicare HMO | Admitting: Internal Medicine

## 2021-08-27 ENCOUNTER — Other Ambulatory Visit: Payer: Self-pay

## 2021-08-27 ENCOUNTER — Encounter: Payer: Self-pay | Admitting: Internal Medicine

## 2021-08-27 VITALS — BP 120/72 | HR 64 | Temp 97.3°F | Ht 68.0 in | Wt 180.5 lb

## 2021-08-27 DIAGNOSIS — Z951 Presence of aortocoronary bypass graft: Secondary | ICD-10-CM

## 2021-08-27 DIAGNOSIS — R351 Nocturia: Secondary | ICD-10-CM | POA: Diagnosis not present

## 2021-08-27 DIAGNOSIS — I7121 Aneurysm of the ascending aorta, without rupture: Secondary | ICD-10-CM

## 2021-08-27 DIAGNOSIS — E1169 Type 2 diabetes mellitus with other specified complication: Secondary | ICD-10-CM | POA: Diagnosis not present

## 2021-08-27 DIAGNOSIS — Z6827 Body mass index (BMI) 27.0-27.9, adult: Secondary | ICD-10-CM | POA: Diagnosis not present

## 2021-08-27 DIAGNOSIS — Z0000 Encounter for general adult medical examination without abnormal findings: Secondary | ICD-10-CM

## 2021-08-27 DIAGNOSIS — H903 Sensorineural hearing loss, bilateral: Secondary | ICD-10-CM

## 2021-08-27 DIAGNOSIS — N401 Enlarged prostate with lower urinary tract symptoms: Secondary | ICD-10-CM | POA: Diagnosis not present

## 2021-08-27 DIAGNOSIS — E782 Mixed hyperlipidemia: Secondary | ICD-10-CM | POA: Diagnosis not present

## 2021-08-27 DIAGNOSIS — I10 Essential (primary) hypertension: Secondary | ICD-10-CM | POA: Diagnosis not present

## 2021-08-27 LAB — POCT URINALYSIS DIPSTICK
Bilirubin, UA: NEGATIVE
Blood, UA: NEGATIVE
Glucose, UA: NEGATIVE
Ketones, UA: NEGATIVE
Leukocytes, UA: NEGATIVE
Nitrite, UA: NEGATIVE
Protein, UA: NEGATIVE
Spec Grav, UA: 1.015 (ref 1.010–1.025)
Urobilinogen, UA: 0.2 E.U./dL
pH, UA: 5 (ref 5.0–8.0)

## 2021-08-27 MED ORDER — FOLIC ACID 1 MG PO TABS: 1.0000 mg | ORAL_TABLET | Freq: Every day | ORAL | 3 refills | Status: AC

## 2021-08-27 NOTE — Progress Notes (Signed)
? ? ? ?Annual Wellness Visit ? ?  ? ?Patient: Thomas Washington, Male    DOB: 01-22-1951, 71 y.o.   MRN: [id number] ?Visit Date: 08/27/2021 ? ?Chief Complaint  ?Patient presents with  ? Medicare Wellness  ? ?Subjective  ?  ?Thomas Washington is a 71 y.o. Male who presents today for his Annual Wellness Visit. ? ?HPI He also presents for health maintenance exam and evaluation of medical issues. ? ?He has a history of coronary artery disease status post CABG June 2020.  History of hyperlipidemia, diabetes mellitus, BPH, essential hypertension, metabolic syndrome and obesity. ? ?History of BPH treated with Flomax. ? ?His labs are reviewed and are stable.  Fasting glucose is 122 and hemoglobin A1c is 6.3%.  PSA is normal.  Lipids are normal.  Liver functions are normal.  BUN and creatinine are normal.  Fasting serum glucose is 122.  ? ? He is physically active.  He currently is on Januvia 100 mg daily.  He takes metformin 1000 mg 1/2 tablet in the morning and 1 tablet in the evening.  He takes metoprolol 25 mg daily and rosuvastatin 40 mg daily. ? ?Planning trips to [place] and to [place] later this year. ? ?Immunizations are up-to-date. ? ?He had colonoscopy by [name] in October 2018.  2 tubular adenomas removed and one polyp with benign findings.  He is due for follow-up later this year.  With his upcoming trips, he might choose to defer that until January. ? ?His hemoglobin A1c is up to 6.3%.  Was 6% in September 2022.  May not have been quite as physically active during the holidays.  We will continue to monitor Accu-Cheks closely and will continue with his exercise regimen. ? ?History of Candida esophagitis 1991.  Schatzki's ring 1991. ? ?Had acute pericarditis 2014 and was seen by [name]. ? ?No known drug allergies. ? ?In June 2021 he underwent coronary artery bypass graft x4 by [name].  He was also found to have a dilated thoracic ascending aorta 4.5 cm in diameter which is being watched. ? ?Has annual  diabetic eye exams. ? ?History of hearing loss.  He saw [name], ENT physician.  This is being monitored.  History of bilateral sensorineural hearing loss.  Hearing aids were discussed. ? ?Social history: Married with 1 adult daughter.  2 grandchildren.  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  Native of [place].  Wife is a retired Immunologist. ? ?Family history: Mother with history of stroke died with complications of dementia and stroke.  Father died at age 99 suddenly presumably of a sudden MI.  1 sister in good health. ? ? ? ? ?Social History  ? ?Social History Narrative  ? Social history: Married with 1 adult daughter.  One grandson.  Another grandchild on the way.  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  Native of [place].  Wife is a retired family Engineer, petroleum.  ?    ? Family history: Mother with history of stroke died with complications of dementia and stroke.  Father died at age 60 suddenly presumably of a sudden MI.  1 sister in good health.  ?    ? ? ?Patient Care Team: ?Elby Showers, MD as PCP - General (Internal Medicine) ?Nahser, Wonda Cheng, MD as PCP - Cardiology (Cardiology) ? ?Review of Systems denies chest pain or shortness of breath ? ? Objective  ?  ?  Vitals: BP 120/72   Pulse 64   Temp (!) 97.3 ?F (36.3 ?C) (Tympanic)   Ht '5\' 8"'$  (1.727 m)   Wt 180 lb 8 oz (81.9 kg)   SpO2 97%   BMI 27.44 kg/m?  ? ?Physical Exam ? ?Skin: Warm and dry.  No cervical adenopathy, thyromegaly or carotid bruits.  Chest is clear to auscultation.  Cardiac exam: Regular rate and rhythm without ectopy.  Abdomen is soft nondistended without hepatosplenomegaly masses or tenderness.  Prostate is normal without nodules.  No lower extremity pitting edema.  Neuro is intact without gross focal deficits.  Affect thought and judgment are normal. ? ? ?Most recent functional status assessment: ?In your present state of health, do you  have any difficulty performing the following activities: 08/27/2021  ?Hearing? N  ?Vision? N  ?Difficulty concentrating or making decisions? N  ?Walking or climbing stairs? N  ?Dressing or bathing? N  ?Doing errands, shopping? N  ?Preparing Food and eating ? N  ?Using the Toilet? N  ?In the past six months, have you accidently leaked urine? N  ?Do you have problems with loss of bowel control? N  ?Managing your Medications? N  ?Managing your Finances? N  ?Housekeeping or managing your Housekeeping? N  ?Some recent data might be hidden  ? ?Most recent fall risk assessment: ?Fall Risk  08/27/2021  ?Falls in the past year? 0  ?Number falls in past yr: 0  ?Injury with Fall? 0  ?Risk for fall due to : No Fall Risks  ?Follow up Falls evaluation completed  ? ? Most recent depression screenings: ?PHQ 2/9 Scores 08/25/2020 08/23/2019  ?PHQ - 2 Score 0 0  ? ?Most recent cognitive screening: ?6CIT Screen 08/27/2021  ?What Year? 0 points  ?What month? 0 points  ?What time? 0 points  ?Count back from 20 0 points  ?Months in reverse 0 points  ?Repeat phrase 0 points  ?Total Score 0  ? ? ? ? ? Assessment & Plan  ?  ? ?Annual wellness visit done today including the all of the following: ?Reviewed patient's Family Medical History ?Reviewed and updated list of patient's medical providers ?Assessment of cognitive impairment was done ?Assessed patient's functional ability ?Established a written schedule for health screening services ?Health Risk Assessent Completed and Reviewed ? ?Discussed health benefits of physical activity, and encouraged him to engage in regular exercise appropriate for his age and condition.  ?  ? ? ? I, Elby Showers, MD, have reviewed all documentation for this visit. The documentation on 08/27/21 for the exam, diagnosis, procedures, and orders are all accurate and complete. ? ? ?Angus Seller, CMA ? ?Impression: ? ?Type 2 diabetes mellitus would like to see hemoglobin A1c around 6%.  Currently A1c is 6.3%.  He  is getting ready to be more physically active in the upcoming months and this is likely to improve.  Follow-up in 6 months.  Fasting glucose is 122. ? ?History of hearing loss being followed by ENT, [name]. ? ?Essential hypertension stable on current regimen ? ?History of coronary artery disease status post CABG June 2020.  He is physically active and has no chest pain. ? ?History of mild aneurysmal dilatation of the ascending thoracic aorta 4.5 cm.  Consider repeat testing.  He will see [name] in April. ? ?BPH treated with Flomax ? ?Hyperlipidemia-mixed noted in 2014 by [name].  Lipids are currently normal on Crestor 40 mg daily. ? ?BMI 27.44- would like to see  weight under 180 pounds.  Before surgery, he weighed 193 pounds in February 2020 and BMI was 30.23 ? ?Plan: He has follow-up with [name] in April.  He will return here in 6 months.  Colonoscopy due later this year but he may elect to defer that until January given his upcoming trips. ? ?

## 2021-08-27 NOTE — Patient Instructions (Signed)
Continue to work on diet exercise and weight loss.  Keep follow-up appointment with [name].  Follow-up here in 6 months.  No change in medications.  Colonoscopy due soon.  ?

## 2021-09-13 ENCOUNTER — Encounter: Payer: Self-pay | Admitting: Cardiovascular Disease

## 2021-09-13 ENCOUNTER — Ambulatory Visit: Payer: Medicare HMO | Admitting: Cardiovascular Disease

## 2021-09-13 VITALS — BP 112/70 | HR 55 | Ht 69.0 in | Wt 181.6 lb

## 2021-09-13 DIAGNOSIS — E785 Hyperlipidemia, unspecified: Secondary | ICD-10-CM | POA: Diagnosis not present

## 2021-09-13 DIAGNOSIS — I2510 Atherosclerotic heart disease of native coronary artery without angina pectoris: Secondary | ICD-10-CM | POA: Diagnosis not present

## 2021-09-13 NOTE — Patient Instructions (Signed)
Medication Instructions:  ?Your physician recommends that you continue on your current medications as directed. Please refer to the Current Medication list given to you today. ? ?*If you need a refill on your cardiac medications before your next appointment, please call your pharmacy* ? ?Lab Work: ?NONE ?If you have labs (blood work) drawn today and your tests are completely normal, you will receive your results only by: ?MyChart Message (if you have MyChart) OR ?A paper copy in the mail ?If you have any lab test that is abnormal or we need to change your treatment, we will call you to review the results. ? ?Testing/Procedures: ?NONE ? ?Follow-Up: ?At [facility], you and your health needs are our priority.  As part of our continuing mission to provide you with exceptional heart care, we have created designated Provider Care Teams.  These Care Teams include your primary Cardiologist (physician) and Advanced Practice Providers (APPs -  Physician Assistants and Nurse Practitioners) who all work together to provide you with the care you need, when you need it. ? ?Your next appointment:   ?1 year(s) ? ?The format for your next appointment:   ?In Person ? ?Provider:   ?Robbie Lis, PA-C or Richardson Dopp, PA-C ?

## 2021-09-13 NOTE — Progress Notes (Signed)
? ? ? ?Thomas Washington ?Date of Birth  1950/11/22 ?      ?[facility] ?[place], [place], [address] ?[place],   [address]   [place],   [address] ?[phone]     [phone]   ?Fax  [phone]    Fax [phone] ? ?Problem List: ?1. Pericarditis ?2. Hyperlipidemia ?3. Hypertension ?4. Diabetes mellitus ? ? ?Previous Notes. ? ?Thomas Washington is a 71 yo who developed CP recently 2 days ago .  Very tender to the touch.  Felt like rib pain.  Thought it was indigestion initially.   Was positional , chest pain eased with sitting forward.  Worse with deep breath.  He drove himself to the ER. D-dimer was normal < 0.27.  Troponin was normal.  He took Motrin and feels much better.   ? ?+ cough, ( dry)   ?No fevers, no chills, no  ?Some dyspnea with walking. ? ?Strong family history of CAD - father died at age 54 of MI.  ? ?He works as a Software engineer at [employer]. ?His glucose control is pretty good.  HbA1c is 6.9 ? ?He had a stress myoview which was normal. An echocardiogram which revealed normal left ventricular systolic function. He was found to have some diastolic dysfunction.  His back doing all of his normal activities without difficulty. ? ?Sept. 14, 2020: ? ?Thomas Washington is seen today for follow-up visit.  He was found to have coronary artery disease.  Is admitted in early June and had coronary artery bypass grafting. ?Doing great.  No angina .   Still some MSK pain  ?Exercising well  ? ?September 03, 2019: ? ?Thomas Washington is seen today for follow-up visit.  He has a history of coronary artery disease and coronary artery bypass grafting in June, 2020. ?Exercising regularly .  No CP,  No chest wall tenderness.    ?Planning on going bear hunting in [place] in several months .  ?Is tolerating the metoprolol better now.   ?Reviewed lipids - numbers look great.  ? ?August 31, 2020:  ? ?Thomas Washington is seen today for follow up for his CAD, CABG in June 2020  ? ?Has not been exercising  ?Some yard  work ,   Lawyer a free standing tree stand in [place]  ? ?September 13, 2021: ? ?Thomas Washington is seen today for follow-up of his coronary artery disease, coronary artery bypass grafting in June 2020. ?No angina  ? ?Labs from March, 2023 show a total cholesterol of 142.  HDL is 57.  LDL is 63.  Triglyceride level is 135.  Hemoglobin A1c is 6.3.  Hemoglobin is 15.5.  Creatinine is 1.07. ? ?Walks 3 times a week - 1.5  miles, encouraged him to walk further  ? ?Current Outpatient Medications on File Prior to Visit  ?Medication Sig Dispense Refill  ? ACCU-CHEK FASTCLIX LANCETS MISC Use as instructed to test blood sugar daily 100 each 2  ? acetaminophen (TYLENOL) 500 MG tablet Take 2 tablets (1,000 mg total) by mouth every 6 (six) hours. 30 tablet 0  ? aspirin EC 81 MG tablet Take 1 tablet (81 mg total) by mouth daily.    ? B Complex Vitamins (B COMPLEX PO) Take 1 tablet by mouth daily.     ? Blood Glucose Monitoring Suppl (ACCU-CHEK AVIVA PLUS) w/Device KIT Use as instructed to test blood sugar daily 1 kit 0  ? Cholecalciferol (VITAMIN D3) 250 MCG (10000 UT)  capsule Take 10,000 Units by mouth at bedtime.    ? Cyanocobalamin (B-12) 5000 MCG CAPS Take 5,000 mcg by mouth at bedtime.    ? folic acid (FOLVITE) 1 MG tablet Take 1 tablet (1 mg total) by mouth daily. 90 tablet 3  ? glucose blood (ACCU-CHEK ACTIVE STRIPS) test strip Use as instructed to test blood sugar daily 100 each 2  ? JANUVIA 100 MG tablet TAKE 1 TABLET BY MOUTH EVERY DAY 90 tablet 3  ? metFORMIN (GLUCOPHAGE) 1000 MG tablet Take 0.5-1 tablets (500-1,000 mg total) by mouth See admin instructions. Take 500 mg in the morning and 1000 mg in the evening (Patient taking differently: Take 500 mg by mouth 2 (two) times daily with a meal.)    ? metoprolol tartrate (LOPRESSOR) 25 MG tablet TAKE 0.5 TABLETS BY MOUTH 2 TIMES DAILY. (Patient taking differently: 12.5 mg daily.) 90 tablet 0  ? ONETOUCH VERIO test strip USE DAILY AS DIRECTED 100 strip PRN  ? rosuvastatin (CRESTOR)  40 MG tablet TAKE 1 TABLET BY MOUTH EVERY DAY 90 tablet 0  ? tamsulosin (FLOMAX) 0.4 MG CAPS capsule Take 0.4 mg by mouth daily after supper.    ? ?No current facility-administered medications on file prior to visit.  ? ? ?Allergies  ?Allergen Reactions  ? Quinolones Other (See Comments)  ?  Patient was warned about not using Cipro and similar antibiotics. ?Recent studies have raised concern that fluoroquinolone antibiotics could be associated with an increased risk of aortic aneurysm ?Fluoroquinolones have non-antimicrobial properties that might jeopardise the integrity of the extracellular matrix of the vascular wall ?In a  propensity score matched cohort study in [place], there was a 66% increased rate of aortic aneurysm or dissection associated with oral fluoroquinolone use, compared wit  ? ? ?Past Medical History:  ?Diagnosis Date  ? Anginal pain ([place])   ? Coronary artery disease   ? Diabetes mellitus without complication ([place])   ? History of kidney stones   ? Hypercholesteremia   ? MRSA (methicillin resistant staph aureus) culture positive   ? ? ?Past Surgical History:  ?Procedure Laterality Date  ? CARDIAC CATHETERIZATION    ? CORONARY ARTERY BYPASS GRAFT N/A 11/21/2018  ? Procedure: CORONARY ARTERY BYPASS GRAFTING (CABG), ON PUMP, TIMES 4, USING LEFT INTERNAL MAMMARY ARTERY AND RIGHT GREATER SAPHENOUS VEIN, LIMA TO LAD, RIGHT SAPHENOUS VEIN TO OM2, RIGHT SAPHENOUS VEIN TO DISTAL CIRC, RIGHT SAPHENOUS VEIN TO ACUTE MARGINAL;  Surgeon: Grace Isaac, MD;  Location: [facility];  Service: Open Heart Surgery;  Laterality: N/A;  ? LEFT HEART CATH AND CORONARY ANGIOGRAPHY N/A 11/14/2018  ? Procedure: LEFT HEART CATH AND CORONARY ANGIOGRAPHY;  Surgeon: Jettie Booze, MD;  Location: [facility];  Service: Cardiovascular;  Laterality: N/A;  ? TEE WITHOUT CARDIOVERSION N/A 11/21/2018  ? Procedure: TRANSESOPHAGEAL ECHOCARDIOGRAM (TEE);  Surgeon: Grace Isaac, MD;  Location: [facility];  Service: Open Heart  Surgery;  Laterality: N/A;  ? VASECTOMY    ? WISDOM TOOTH EXTRACTION    ? ? ?Social History  ? ?Tobacco Use  ?Smoking Status Never  ?Smokeless Tobacco Never  ? ? ?Social History  ? ?Substance and Sexual Activity  ?Alcohol Use Yes  ? Comment: 1 bottle of wine monthly + 2 beers  ? ? ?Family History  ?Problem Relation Age of Onset  ? Heart attack Father   ? Stroke Mother   ? Healthy Sister   ? Healthy Daughter   ? Colon cancer Neg Hx   ? ? ?  Reviw of Systems:  ?Reviewed in the HPI.  All other systems are negative. ? ?Physical Exam: ?Blood pressure 112/70, pulse (!) 55, height _0  (1.753 m), weight 181 lb 9.6 oz (82.4 kg), SpO2 96 %. ? ?GEN:  Well nourished, well developed in no acute distress ?HEENT: Normal ?NECK: No JVD; No carotid bruits ?LYMPHATICS: No lymphadenopathy ?CARDIAC: RRR , no murmurs, rubs, gallops ?RESPIRATORY:  Clear to auscultation without rales, wheezing or rhonchi  ?ABDOMEN: Soft, non-tender, non-distended ?MUSCULOSKELETAL:  No edema; No deformity  ?SKIN: Warm and dry ?NEUROLOGIC:  Alert and oriented x 3 ? ? ?ECG: September 13, 2021: Sinus bradycardia 55.  No ST or T wave changes. ? ?Assessment / Plan ? ?1.  CAD:   S/p CABG.      No angia .   Cont current meds. ? ? ?2.  Hyperlipidemia:   lipids from March , 2023 look good  ? ? ?Return in 1 year to see APP  ? ? ?Mertie Moores, MD  ?09/13/2021 2:30 PM    ?[facility] ?[address]  [address] ?[place], [place]  [address] ?Pager [phone] ?Phone: [phone]; Fax: [phone]  ? ? ? ?

## 2021-11-01 ENCOUNTER — Telehealth: Payer: Self-pay

## 2021-11-01 DIAGNOSIS — K921 Melena: Secondary | ICD-10-CM

## 2021-11-01 NOTE — Addendum Note (Signed)
Addended by: Angus Seller on: 11/01/2021 04:51 PM   Modules accepted: Orders

## 2021-11-01 NOTE — Telephone Encounter (Signed)
Patient called states that he has increasing GI issues for over a year. Would like to make office visit. He has had constipation that has increased. He states that he had bright red blood on outside of stool twice last week. He does have bloating but denies any pain. All symptoms are increased after eating. Patient is able to have bowel movement every other day.   Advised we will call for appointment time after reviewed by Dr. Renold Genta.

## 2021-11-01 NOTE — Telephone Encounter (Signed)
Referral placed and patient has been notified.

## 2021-11-02 ENCOUNTER — Other Ambulatory Visit: Payer: Self-pay | Admitting: Cardiovascular Disease

## 2021-12-27 DIAGNOSIS — H0100A Unspecified blepharitis right eye, upper and lower eyelids: Secondary | ICD-10-CM | POA: Diagnosis not present

## 2021-12-27 DIAGNOSIS — H0100B Unspecified blepharitis left eye, upper and lower eyelids: Secondary | ICD-10-CM | POA: Diagnosis not present

## 2021-12-27 DIAGNOSIS — E119 Type 2 diabetes mellitus without complications: Secondary | ICD-10-CM | POA: Diagnosis not present

## 2021-12-27 DIAGNOSIS — H5213 Myopia, bilateral: Secondary | ICD-10-CM | POA: Diagnosis not present

## 2021-12-27 LAB — HM DIABETES EYE EXAM

## 2021-12-28 ENCOUNTER — Encounter: Payer: Self-pay | Admitting: Internal Medicine

## 2022-01-12 ENCOUNTER — Encounter: Payer: Self-pay | Admitting: Internal Medicine

## 2022-01-12 ENCOUNTER — Telehealth: Payer: Self-pay | Admitting: Internal Medicine

## 2022-02-09 ENCOUNTER — Telehealth: Payer: Self-pay | Admitting: *Deleted

## 2022-02-09 NOTE — Telephone Encounter (Signed)
I am okay with direct, unless the patient prefers office assessment first.  Thanks

## 2022-02-09 NOTE — Telephone Encounter (Signed)
Spoke with pt.  He states all GI issues have resolved.

## 2022-02-09 NOTE — Telephone Encounter (Signed)
[name],  This pt is coming for a recall colonoscopy on 03-16-22.    While prepping his chart for previsit I noted this from his PCP- Patient called states that he has increasing GI issues for over a year. Would like to make office visit. He has had constipation that has increased. He states that he had bright red blood on outside of stool twice last week. He does have bloating but denies any pain. All symptoms are increased after eating. Patient is able to have bowel movement every other day.  This note is from 11-01-21.  I do not see any further notes to his PCP regarding this since then.  Is he ok for direct or would you like an OV first?    Thanks, Cyril Mourning

## 2022-02-19 DIAGNOSIS — Z0100 Encounter for examination of eyes and vision without abnormal findings: Secondary | ICD-10-CM | POA: Diagnosis not present

## 2022-02-23 ENCOUNTER — Ambulatory Visit (AMBULATORY_SURGERY_CENTER): Payer: Self-pay | Admitting: *Deleted

## 2022-02-23 VITALS — Ht 69.0 in | Wt 183.0 lb

## 2022-02-23 DIAGNOSIS — Z86010 Personal history of colonic polyps: Secondary | ICD-10-CM

## 2022-02-23 MED ORDER — NA SULFATE-K SULFATE-MG SULF 17.5-3.13-1.6 GM/177ML PO SOLN: 1.0000 | ORAL | 0 refills | Status: DC

## 2022-02-23 NOTE — Progress Notes (Signed)
Patient is here in-person for PV. Patient denies any allergies to eggs or soy. Patient denies any problems with anesthesia/sedation. Patient is not on any oxygen at home. Patient is not taking any diet/weight loss medications or blood thinners. Went over procedure prep instructions with the patient. Patient is aware of our care-partner policy. Patient notified to use Singlecare card given to pt for prescription. Patient denies constipation.

## 2022-02-25 ENCOUNTER — Other Ambulatory Visit: Payer: Medicare HMO

## 2022-02-25 DIAGNOSIS — R351 Nocturia: Secondary | ICD-10-CM | POA: Diagnosis not present

## 2022-02-25 DIAGNOSIS — I10 Essential (primary) hypertension: Secondary | ICD-10-CM

## 2022-02-25 DIAGNOSIS — E785 Hyperlipidemia, unspecified: Secondary | ICD-10-CM | POA: Diagnosis not present

## 2022-02-25 DIAGNOSIS — E1169 Type 2 diabetes mellitus with other specified complication: Secondary | ICD-10-CM

## 2022-02-25 DIAGNOSIS — E8881 Metabolic syndrome: Secondary | ICD-10-CM | POA: Diagnosis not present

## 2022-02-25 DIAGNOSIS — N401 Enlarged prostate with lower urinary tract symptoms: Secondary | ICD-10-CM

## 2022-02-25 DIAGNOSIS — E138 Other specified diabetes mellitus with unspecified complications: Secondary | ICD-10-CM

## 2022-02-25 DIAGNOSIS — E782 Mixed hyperlipidemia: Secondary | ICD-10-CM | POA: Diagnosis not present

## 2022-02-26 LAB — LIPID PANEL
Cholesterol: 136 mg/dL (ref <200)
HDL: 51 mg/dL (ref >=40)
LDL Cholesterol (Calc): 66 mg/dL (calc)
Non-HDL Cholesterol (Calc): 85 mg/dL (calc) (ref <130)
Total CHOL/HDL Ratio: 2.7 (calc) (ref <5.0)
Triglycerides: 104 mg/dL (ref <150)

## 2022-02-26 LAB — CBC WITH DIFFERENTIAL/PLATELET
Absolute Monocytes: 988 cells/uL — ABNORMAL HIGH (ref 200–950)
Basophils Absolute: 92 cells/uL (ref 0–200)
Basophils Relative: 1.5 %
Eosinophils Absolute: 348 cells/uL (ref 15–500)
Eosinophils Relative: 5.7 %
HCT: 48.6 % (ref 38.5–50.0)
Hemoglobin: 16.5 g/dL (ref 13.2–17.1)
Lymphs Abs: 1726 cells/uL (ref 850–3900)
MCH: 29.4 pg (ref 27.0–33.0)
MCHC: 34 g/dL (ref 32.0–36.0)
MCV: 86.6 fL (ref 80.0–100.0)
MPV: 9.8 fL (ref 7.5–12.5)
Monocytes Relative: 16.2 %
Neutro Abs: 2946 cells/uL (ref 1500–7800)
Neutrophils Relative %: 48.3 %
Platelets: 214 10*3/uL (ref 140–400)
RBC: 5.61 10*6/uL (ref 4.20–5.80)
RDW: 13.6 % (ref 11.0–15.0)
Total Lymphocyte: 28.3 %
WBC: 6.1 10*3/uL (ref 3.8–10.8)

## 2022-02-26 LAB — HEPATIC FUNCTION PANEL
AG Ratio: 1.4 (calc) (ref 1.0–2.5)
ALT: 18 U/L (ref 9–46)
AST: 24 U/L (ref 10–35)
Albumin: 4.5 g/dL (ref 3.6–5.1)
Alkaline phosphatase (APISO): 62 U/L (ref 35–144)
Bilirubin, Direct: 0.1 mg/dL (ref 0.0–0.2)
Globulin: 3.2 g/dL (calc) (ref 1.9–3.7)
Indirect Bilirubin: 0.6 mg/dL (calc) (ref 0.2–1.2)
Total Bilirubin: 0.7 mg/dL (ref 0.2–1.2)
Total Protein: 7.7 g/dL (ref 6.1–8.1)

## 2022-02-26 LAB — COMPLETE METABOLIC PANEL WITH GFR
AG Ratio: 1.4 (calc) (ref 1.0–2.5)
ALT: 18 U/L (ref 9–46)
AST: 24 U/L (ref 10–35)
Albumin: 4.5 g/dL (ref 3.6–5.1)
Alkaline phosphatase (APISO): 62 U/L (ref 35–144)
BUN: 11 mg/dL (ref 7–25)
CO2: 29 mmol/L (ref 20–32)
Calcium: 9.6 mg/dL (ref 8.6–10.3)
Chloride: 101 mmol/L (ref 98–110)
Creat: 1.03 mg/dL (ref 0.70–1.35)
Globulin: 3.2 g/dL (calc) (ref 1.9–3.7)
Glucose, Bld: 136 mg/dL — ABNORMAL HIGH (ref 65–99)
Potassium: 4.6 mmol/L (ref 3.5–5.3)
Sodium: 139 mmol/L (ref 135–146)
Total Bilirubin: 0.7 mg/dL (ref 0.2–1.2)
Total Protein: 7.7 g/dL (ref 6.1–8.1)
eGFR: 79 mL/min/{1.73_m2} (ref >=60)

## 2022-02-26 LAB — HEMOGLOBIN A1C
Hgb A1c MFr Bld: 6.1 % of total Hgb — ABNORMAL HIGH (ref <5.7)
Mean Plasma Glucose: 128 mg/dL
eAG (mmol/L): 7.1 mmol/L

## 2022-02-26 LAB — PSA: PSA: 2.21 ng/mL (ref <=4.00)

## 2022-03-01 ENCOUNTER — Encounter: Payer: Self-pay | Admitting: Internal Medicine

## 2022-03-01 ENCOUNTER — Ambulatory Visit: Payer: Medicare HMO | Admitting: Internal Medicine

## 2022-03-08 ENCOUNTER — Encounter: Payer: Self-pay | Admitting: Internal Medicine

## 2022-03-08 ENCOUNTER — Ambulatory Visit (INDEPENDENT_AMBULATORY_CARE_PROVIDER_SITE_OTHER): Payer: Medicare HMO | Admitting: Internal Medicine

## 2022-03-08 VITALS — BP 116/80 | HR 59 | Temp 97.7°F | Ht 69.0 in | Wt 181.1 lb

## 2022-03-08 DIAGNOSIS — N401 Enlarged prostate with lower urinary tract symptoms: Secondary | ICD-10-CM | POA: Diagnosis not present

## 2022-03-08 DIAGNOSIS — E785 Hyperlipidemia, unspecified: Secondary | ICD-10-CM | POA: Diagnosis not present

## 2022-03-08 DIAGNOSIS — Z6826 Body mass index (BMI) 26.0-26.9, adult: Secondary | ICD-10-CM

## 2022-03-08 DIAGNOSIS — I10 Essential (primary) hypertension: Secondary | ICD-10-CM | POA: Diagnosis not present

## 2022-03-08 DIAGNOSIS — R351 Nocturia: Secondary | ICD-10-CM

## 2022-03-08 DIAGNOSIS — Z951 Presence of aortocoronary bypass graft: Secondary | ICD-10-CM

## 2022-03-08 DIAGNOSIS — E119 Type 2 diabetes mellitus without complications: Secondary | ICD-10-CM | POA: Diagnosis not present

## 2022-03-08 DIAGNOSIS — E1169 Type 2 diabetes mellitus with other specified complication: Secondary | ICD-10-CM

## 2022-03-08 MED ORDER — TAMSULOSIN HCL 0.4 MG PO CAPS: 0.4000 mg | ORAL_CAPSULE | ORAL | 0 refills | Status: DC | PRN

## 2022-03-08 MED ORDER — TAMSULOSIN HCL 0.4 MG PO CAPS: 0.4000 mg | ORAL_CAPSULE | ORAL | 3 refills | Status: DC | PRN

## 2022-03-08 NOTE — Patient Instructions (Addendum)
It was a pleasure to see you today.  Labs are stable.  Continue to work on diet and exercise.  Continue current medications.  New prescription for Januvia (written) provided.  New prescription for diabetic test strips lancets and a new glucose monitor per patient request.  Vaccines discussed.  He will get at pharmacy.  Return in 6 months for health maintenance exam and Medicare wellness visit.  Flomax refilled.

## 2022-03-08 NOTE — Progress Notes (Signed)
   Subjective:    Patient ID: Thomas Washington, male    DOB: 26-Apr-1951, 71 y.o.   MRN: [id number]  HPI 71 year old Male seen for 6 month follow up.  Has been traveling.  Has 3 cruises planned for next year.  Feels well with no new complaints.  Has colonoscopy scheduled with [name] for October 4.  He has a history of type 2 diabetes mellitus, coronary artery disease status post CABG in June 2020.  History of hyperlipidemia, BPH, essential hypertension, metabolic syndrome and obesity.  CV C and C met are within normal limits with the exception of fasting glucose of 136.  His hemoglobin A1c is 6.1% having been 6.3% in March.  Kidney and liver functions are normal.  PSA is normal.  Review of Systems no new complaints except for thickened toenails.  May want to see podiatrist at [facility] for toenail trimming.     Objective:   Physical Exam  Vital signs reviewed.  Pulse 59 regular blood pressure 116/80 pulse oximetry 98% weight 181 pounds 1.9 ounces BMI 26.75  Neck is supple without JVD thyromegaly or carotid bruits.  Chest clear.  Cardiac exam: Regular rate and rhythm without ectopy.  No pitting edema of the lower extremities.  He has some thickened toenails that likely could benefit from podiatry trimming them.  Pulses are good in his feet.      Assessment & Plan:  Type 2 diabetes mellitus-stable on Januvia.  Hemoglobin A1c 6.1%.  Also treated with metformin  History of CABG-stable followed by Cardiology.  No chest pain.  Exercises regularly.  Treated with metoprolol  Remains on rosuvastatin 40 mg daily status post CABG  BMI 26.75-continue with diet and exercise regimen  BPH treated with Flomax  History of CABG/hyperlipidemia treated with Crestor 40 mg daily  Vaccines discussed  Plan: Prescription given for diabetic test strips, lancets and new glucose monitor at patient request.  Consider podiatrist to trim toenails.

## 2022-03-16 ENCOUNTER — Encounter: Payer: Self-pay | Admitting: Internal Medicine

## 2022-03-16 ENCOUNTER — Telehealth: Payer: Self-pay

## 2022-03-16 ENCOUNTER — Ambulatory Visit (AMBULATORY_SURGERY_CENTER): Payer: Medicare HMO | Admitting: Internal Medicine

## 2022-03-16 ENCOUNTER — Encounter: Payer: Self-pay | Admitting: Cardiovascular Disease

## 2022-03-16 VITALS — BP 111/62 | HR 54 | Temp 96.0°F | Resp 15 | Ht 69.0 in | Wt 183.0 lb

## 2022-03-16 DIAGNOSIS — I10 Essential (primary) hypertension: Secondary | ICD-10-CM | POA: Diagnosis not present

## 2022-03-16 DIAGNOSIS — E669 Obesity, unspecified: Secondary | ICD-10-CM | POA: Diagnosis not present

## 2022-03-16 DIAGNOSIS — D120 Benign neoplasm of cecum: Secondary | ICD-10-CM | POA: Diagnosis not present

## 2022-03-16 DIAGNOSIS — Z09 Encounter for follow-up examination after completed treatment for conditions other than malignant neoplasm: Secondary | ICD-10-CM

## 2022-03-16 DIAGNOSIS — K621 Rectal polyp: Secondary | ICD-10-CM | POA: Diagnosis not present

## 2022-03-16 DIAGNOSIS — D128 Benign neoplasm of rectum: Secondary | ICD-10-CM

## 2022-03-16 DIAGNOSIS — I2510 Atherosclerotic heart disease of native coronary artery without angina pectoris: Secondary | ICD-10-CM | POA: Diagnosis not present

## 2022-03-16 DIAGNOSIS — E119 Type 2 diabetes mellitus without complications: Secondary | ICD-10-CM | POA: Diagnosis not present

## 2022-03-16 DIAGNOSIS — Z86010 Personal history of colonic polyps: Secondary | ICD-10-CM

## 2022-03-16 MED ORDER — SODIUM CHLORIDE 0.9 % IV SOLN: 500.0000 mL | Freq: Once | INTRAVENOUS | Status: DC

## 2022-03-16 NOTE — Telephone Encounter (Signed)
Metoprolol already discontinued from patients active med list.

## 2022-03-16 NOTE — Progress Notes (Signed)
Pt's states no medical or surgical changes since previsit or office visit. 

## 2022-03-16 NOTE — Op Note (Signed)
[name] Patient Name: Thomas Washington Procedure Date: 03/16/2022 7:17 AM MRN: [id number] Endoscopist: Docia Chuck. [name] , MD Age: 71 Referring MD:  Date of Birth: 11-Jul-1950 Gender: Male Account #: [id number] Procedure:                Colonoscopy with cold snare polypectomy x 1 Indications:              High risk colon cancer surveillance: Personal                            history of non-advanced adenomas. Previous exams                            2006, 2018 Medicines:                Monitored Anesthesia Care Procedure:                Pre-Anesthesia Assessment:                           - Prior to the procedure, a History and Physical                            was performed, and patient medications and                            allergies were reviewed. The patient's tolerance of                            previous anesthesia was also reviewed. The risks                            and benefits of the procedure and the sedation                            options and risks were discussed with the patient.                            All questions were answered, and informed consent                            was obtained. Prior Anticoagulants: The patient has                            taken no previous anticoagulant or antiplatelet                            agents. ASA Grade Assessment: II - A patient with                            mild systemic disease. After reviewing the risks                            and benefits, the patient was deemed in  satisfactory condition to undergo the procedure.                           After obtaining informed consent, the colonoscope                            was passed under direct vision. Throughout the                            procedure, the patient's blood pressure, pulse, and                            oxygen saturations were monitored continuously. The                            CF [id number] #[id number] was  introduced through the anus                            and advanced to the the cecum, identified by                            appendiceal orifice and ileocecal valve. The                            ileocecal valve, appendiceal orifice, and rectum                            were photographed. The quality of the bowel                            preparation was excellent. The colonoscopy was                            performed without difficulty. The patient tolerated                            the procedure well. The bowel preparation used was                            SUPREP via split dose instruction. Scope In: 8:21:27 AM Scope Out: 8:35:50 AM Scope Withdrawal Time: 0 hours 11 minutes 13 seconds  Total Procedure Duration: 0 hours 14 minutes 23 seconds  Findings:                 A 2 mm polyp was found in the cecum. The polyp was                            sessile. The polyp was removed with a cold snare.                            Resection and retrieval were complete.                           A 2 mm polyp was found  in the rectum. The polyp was                            removed with a jumbo cold forceps. Resection and                            retrieval were complete.                           A few small-mouthed diverticula were found in the                            sigmoid colon.                           Internal hemorrhoids were found during                            retroflexion. The hemorrhoids were small.                           The exam was otherwise without abnormality on                            direct and retroflexion views. Complications:            No immediate complications. Estimated blood loss:                            None. Estimated Blood Loss:     Estimated blood loss: none. Impression:               - One 2 mm polyp in the cecum, removed with a cold                            snare. Resected and retrieved.                           - One 2 mm polyp in  the rectum, removed with a                            jumbo cold forceps. Resected and retrieved.                           - Diverticulosis in the sigmoid colon.                           - Internal hemorrhoids.                           - The examination was otherwise normal on direct                            and retroflexion views. Recommendation:           - Repeat colonoscopy in 5 years for surveillance.                           -  Patient has a contact number available for                            emergencies. The signs and symptoms of potential                            delayed complications were discussed with the                            patient. Return to normal activities tomorrow.                            Written discharge instructions were provided to the                            patient.                           - Resume previous diet.                           - Continue present medications.                           - Await pathology results. Docia Chuck. [name], MD 03/16/2022 8:54:53 AM This report has been signed electronically.

## 2022-03-16 NOTE — Telephone Encounter (Signed)
-----   Message from Thayer Headings, MD sent at 03/16/2022 12:23 PM EDT ----- Marykay Lex,  Happy Wednesday   Please DC metoprolol for mr. Woolen I have responded to him directly (/ wife )   Hope you are having a great day .  P

## 2022-03-16 NOTE — Progress Notes (Signed)
Called to room to assist during endoscopic procedure.  Patient ID and intended procedure confirmed with present staff. Received instructions for my participation in the procedure from the performing physician.  

## 2022-03-16 NOTE — Patient Instructions (Signed)
Handouts given on polyps and diverticulosis.    YOU HAD AN ENDOSCOPIC PROCEDURE TODAY AT THE [facility]:   Refer to the procedure report that was given to you for any specific questions about what was found during the examination.  If the procedure report does not answer your questions, please call your gastroenterologist to clarify.  If you requested that your care partner not be given the details of your procedure findings, then the procedure report has been included in a sealed envelope for you to review at your convenience later.  YOU SHOULD EXPECT: Some feelings of bloating in the abdomen. Passage of more gas than usual.  Walking can help get rid of the air that was put into your GI tract during the procedure and reduce the bloating. If you had a lower endoscopy (such as a colonoscopy or flexible sigmoidoscopy) you may notice spotting of blood in your stool or on the toilet paper. If you underwent a bowel prep for your procedure, you may not have a normal bowel movement for a few days.  Please Note:  You might notice some irritation and congestion in your nose or some drainage.  This is from the oxygen used during your procedure.  There is no need for concern and it should clear up in a day or so.  SYMPTOMS TO REPORT IMMEDIATELY:  Following lower endoscopy (colonoscopy or flexible sigmoidoscopy):  Excessive amounts of blood in the stool  Significant tenderness or worsening of abdominal pains  Swelling of the abdomen that is new, acute  Fever of 100F or higher   For urgent or emergent issues, a gastroenterologist can be reached at any hour by calling [phone]. Do not use MyChart messaging for urgent concerns.    DIET:  We do recommend a small meal at first, but then you may proceed to your regular diet.  Drink plenty of fluids but you should avoid alcoholic beverages for 24 hours.  ACTIVITY:  You should plan to take it easy for the rest of today and you should NOT  DRIVE or use heavy machinery until tomorrow (because of the sedation medicines used during the test).    FOLLOW UP: Our staff will call the number listed on your records the next business day following your procedure.  We will call around 7:15- 8:00 am to check on you and address any questions or concerns that you may have regarding the information given to you following your procedure. If we do not reach you, we will leave a message.     If any biopsies were taken you will be contacted by phone or by letter within the next 1-3 weeks.  Please call us at [phone] if you have not heard about the biopsies in 3 weeks.    SIGNATURES/CONFIDENTIALITY: You and/or your care partner have signed paperwork which will be entered into your electronic medical record.  These signatures attest to the fact that that the information above on your After Visit Summary has been reviewed and is understood.  Full responsibility of the confidentiality of this discharge information lies with you and/or your care-partner. 

## 2022-03-16 NOTE — Progress Notes (Signed)
Report to PACU, RN, vss, BBS= Clear.  

## 2022-03-17 ENCOUNTER — Telehealth: Payer: Self-pay

## 2022-03-17 NOTE — Telephone Encounter (Signed)
  Follow up Call-     03/16/2022    7:24 AM  Call back number  Post procedure Call Back phone  # [phone]  Permission to leave phone message Yes    Patient answered the phone and stated " He is asleep and hung up the phone"

## 2022-03-18 ENCOUNTER — Encounter: Payer: Self-pay | Admitting: Internal Medicine

## 2022-03-23 ENCOUNTER — Encounter: Payer: Self-pay | Admitting: Podiatry

## 2022-03-23 ENCOUNTER — Ambulatory Visit: Payer: Medicare HMO | Admitting: Podiatry

## 2022-03-23 DIAGNOSIS — M79675 Pain in left toe(s): Secondary | ICD-10-CM | POA: Diagnosis not present

## 2022-03-23 DIAGNOSIS — B351 Tinea unguium: Secondary | ICD-10-CM | POA: Diagnosis not present

## 2022-03-23 DIAGNOSIS — M2042 Other hammer toe(s) (acquired), left foot: Secondary | ICD-10-CM | POA: Diagnosis not present

## 2022-03-23 DIAGNOSIS — M79674 Pain in right toe(s): Secondary | ICD-10-CM | POA: Diagnosis not present

## 2022-03-23 DIAGNOSIS — M2041 Other hammer toe(s) (acquired), right foot: Secondary | ICD-10-CM

## 2022-03-23 NOTE — Progress Notes (Signed)
Subjective:   Patient ID: Thomas Washington, male   DOB: 71 y.o.   MRN: [id number]   HPI Patient presents concerned about his digits history of using antifungal and he does have neuropathy diabetes and digital deformities.  States nails can be bothersome and very hard to cut.  Patient does not smoke likes to be active   Review of Systems  All other systems reviewed and are negative.       Objective:  Physical Exam Vitals and nursing note reviewed.  Constitutional:      Appearance: He is well-developed.  Pulmonary:     Effort: Pulmonary effort is normal.  Musculoskeletal:        General: Normal range of motion.  Skin:    General: Skin is warm.  Neurological:     Mental Status: He is alert.     Neurovascular status found to be intact muscle strength found to be adequate range of motion adequate mild diminishment sharp dull vibratory.  Nails are incurvated and they do grow over the did the corners and there is irritation distal secondary to digital deformity right over left lesser toes.  Patient has good digital perfusion well-oriented x3     Assessment:  Patient who has structural deformity of the digits with thick yellow nailbeds that become irritated on the edges and he did take antifungal which did improve short-term     Plan:  We will reviewed condition went ahead today carefully debrided the nailbeds 1-5 both feet I do think this could be done by a manicurist in the future but if there is any issues he will see me but I did try to explain to him at great length the relationship the digital deformities to the nail condition that he has

## 2022-03-30 ENCOUNTER — Telehealth: Payer: Self-pay | Admitting: Internal Medicine

## 2022-03-30 MED ORDER — NIRMATRELVIR/RITONAVIR (PAXLOVID)TABLET: 3.0000 | ORAL_TABLET | Freq: Two times a day (BID) | ORAL | 0 refills | Status: AC

## 2022-03-30 MED ORDER — HYDROCODONE BIT-HOMATROP MBR 5-1.5 MG/5ML PO SOLN: 5.0000 mL | Freq: Three times a day (TID) | ORAL | 0 refills | Status: DC | PRN

## 2022-03-30 NOTE — Telephone Encounter (Signed)
Patient has tested positive for Covid-19. Agreeable to taking Paxlovid. Regular strength Pavlovid sent in for him along with Hycodan for cough. He will monitor pulse ox and walk some to prevent atelectasis. Stay well hydrated.Quarantine for 5 days.   MJB, MD

## 2022-04-25 NOTE — Patient Outreach (Signed)
Error

## 2022-05-18 ENCOUNTER — Other Ambulatory Visit: Payer: Self-pay | Admitting: Internal Medicine

## 2022-06-23 ENCOUNTER — Telehealth: Payer: Self-pay | Admitting: Internal Medicine

## 2022-06-23 ENCOUNTER — Encounter: Payer: Self-pay | Admitting: Internal Medicine

## 2022-06-23 MED ORDER — NIRMATRELVIR/RITONAVIR (PAXLOVID)TABLET: 3.0000 | ORAL_TABLET | Freq: Two times a day (BID) | ORAL | 0 refills | Status: AC

## 2022-06-23 NOTE — Telephone Encounter (Signed)
Patient is traveling out of the country and would like to have Paxlovid in case he contracts Covid-19. This has been sent in. He has Type 2 Diabetes mellitus, HTN, Hx CABG , hyperlipidemia and is at risk for complications of BVAPO-14. MJB, MD

## 2022-08-09 ENCOUNTER — Telehealth: Payer: Self-pay | Admitting: Internal Medicine

## 2022-08-09 ENCOUNTER — Encounter: Payer: Self-pay | Admitting: Internal Medicine

## 2022-08-09 NOTE — Telephone Encounter (Signed)
Discussed options for sinusitis that has been present for a couple of weeks and not improving. Dr. Earlie Counts has doxycycline on hand and will try that for patient. MJB, MD

## 2022-08-23 NOTE — Progress Notes (Signed)
Annual Wellness Visit    Patient Care Team: Tanaisha Pittman, Cresenciano Lick, MD as PCP - General (Internal Medicine) Nahser, Wonda Cheng, MD as PCP - Cardiology (Cardiology)  Visit Date: 08/30/22   Chief Complaint  Patient presents with   Medicare Wellness   Annual Exam    Subjective:   Patient: Thomas Washington, Male    DOB: Jun 10, 1951, 72 y.o.   MRN: [id number]  Thomas Washington is a 72 y.o. Male who presents today for his Annual Wellness Visit. He has a history of anginal pain, coronary artery disease, Type 2 diabetes mellitus, kidney stones, hypercholesterolemia.  Reports feeling well regarding general health.  History of Type 2 diabetes mellitus treated with Glucophage 1,000 mg twice daily with a meal, Januvia 100 mg daily. HGBA1c at 6.7% on 08/26/22. Went down to 500 mg Glucophage twice daily because he was getting hypoglycemic at night, involving sweating and shaking.  History of hypercholesterolemia treated with Crestor 40 mg daily. Lipid panel normal on 08/26/22.  History of Vitamin D deficiency treated with Vitamin D3 10,000 units daily at bedtime.  TSH elevated at 5.0 on 08/25/21. Glucose elevated at 112 on 08/26/22.  Colonoscopy last completed 03/16/22. One 2 mm polyp in cecum, removed, one 2 mm polyp in rectum, removed, diverticulosis in sigmoid colon, internal hemorrhoids. Pathology showed one tubular adenoma, hyperplastic polyp. Examination otherwise normal. Recommended repeat in 2028.   Past Medical History:  Diagnosis Date   Anginal pain ([facility])    Coronary artery disease    Diabetes mellitus without complication ([place])    History of kidney stones    Hypercholesteremia    MRSA (methicillin resistant staph aureus) culture positive      Family History  Problem Relation Age of Onset   Stroke Mother    Heart attack Father    Healthy Sister    Healthy Daughter    Colon cancer Neg Hx    Colon polyps Neg Hx    Esophageal cancer Neg Hx    Rectal cancer Neg Hx    Stomach cancer  Neg Hx      Social History   Social History Narrative   Social history: Married with 1 adult daughter.  One grandson.  Another grandchild on the way.  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  Native of [place].  Wife is a retired family Engineer, petroleum.       Family history: Mother with history of stroke died with complications of dementia and stroke.  Father died at age 47 suddenly presumably of a sudden MI.  1 sister in good health.         Review of Systems  Constitutional:  Negative for chills, fever, malaise/fatigue and weight loss.  HENT:  Negative for hearing loss, sinus pain and sore throat.   Respiratory:  Negative for cough and hemoptysis.   Cardiovascular:  Negative for chest pain, palpitations, leg swelling and PND.  Gastrointestinal:  Negative for abdominal pain, constipation, diarrhea, heartburn, nausea and vomiting.  Genitourinary:  Negative for dysuria, frequency and urgency.  Musculoskeletal:  Negative for back pain, myalgias and neck pain.  Skin:  Negative for itching and rash.  Neurological:  Negative for dizziness, tingling, seizures and headaches.  Endo/Heme/Allergies:  Negative for polydipsia.  Psychiatric/Behavioral:  Negative for depression. The patient is not nervous/anxious.       Objective:   Vitals: BP 116/80   Pulse 91   Temp 98.1 F (36.7 C) (Tympanic)  Ht 5\' 7"  (1.702 m)   Wt 186 lb 12.8 oz (84.7 kg)   SpO2 96%   BMI 29.26 kg/m   Physical Exam Vitals and nursing note reviewed.  Constitutional:      General: He is awake. He is not in acute distress.    Appearance: Normal appearance. He is not ill-appearing or toxic-appearing.  HENT:     Head: Normocephalic and atraumatic.     Right Ear: Tympanic membrane, ear canal and external ear normal.     Left Ear: Tympanic membrane, ear canal and external ear normal.     Mouth/Throat:     Pharynx: Oropharynx is clear.  Eyes:     Extraocular Movements:  Extraocular movements intact.     Pupils: Pupils are equal, round, and reactive to light.  Neck:     Thyroid: No thyroid mass, thyromegaly or thyroid tenderness.     Vascular: No carotid bruit.  Cardiovascular:     Rate and Rhythm: Normal rate and regular rhythm. No extrasystoles are present.    Pulses:          Dorsalis pedis pulses are 1+ on the right side and 1+ on the left side.       Posterior tibial pulses are 1+ on the right side and 1+ on the left side.     Heart sounds: Normal heart sounds. No murmur heard.    No friction rub. No gallop.  Pulmonary:     Effort: Pulmonary effort is normal.     Breath sounds: Normal breath sounds. No decreased breath sounds, wheezing, rhonchi or rales.  Chest:     Chest wall: No mass.  Abdominal:     Palpations: Abdomen is soft.     Tenderness: There is no abdominal tenderness.     Hernia: No hernia is present.  Genitourinary:    Prostate: Normal. Not enlarged and no nodules present.     Comments: Prostate is smooth and symmetrical on examination. Musculoskeletal:     Cervical back: Normal range of motion.     Right lower leg: No edema.     Left lower leg: No edema.  Lymphadenopathy:     Cervical: No cervical adenopathy.     Upper Body:     Right upper body: No supraclavicular adenopathy.     Left upper body: No supraclavicular adenopathy.  Skin:    General: Skin is warm and dry.  Neurological:     General: No focal deficit present.     Mental Status: He is alert and oriented to person, place, and time. Mental status is at baseline.     Cranial Nerves: Cranial nerves 2-12 are intact.     Sensory: Sensation is intact.     Motor: Motor function is intact.     Coordination: Coordination is intact.     Gait: Gait is intact.     Deep Tendon Reflexes: Reflexes are normal and symmetric.  Psychiatric:        Attention and Perception: Attention normal.        Mood and Affect: Mood normal.        Speech: Speech normal.        Behavior:  Behavior normal. Behavior is cooperative.        Thought Content: Thought content normal.        Cognition and Memory: Cognition and memory normal.        Judgment: Judgment normal.      Most recent functional status assessment:  08/30/2022    2:58 PM  In your present state of health, do you have any difficulty performing the following activities:  Hearing? 0  Vision? 0  Difficulty concentrating or making decisions? 0  Walking or climbing stairs? 0  Dressing or bathing? 0  Doing errands, shopping? 0  Preparing Food and eating ? N  Using the Toilet? N  In the past six months, have you accidently leaked urine? N  Do you have problems with loss of bowel control? N  Managing your Medications? N  Managing your Finances? N  Housekeeping or managing your Housekeeping? N   Most recent fall risk assessment:    08/30/2022    2:58 PM  [place] in the past year? 0  Number falls in past yr: 0  Injury with Fall? 0  Risk for fall due to : No Fall Risks  Follow up Falls prevention discussed    Most recent depression screenings:    08/30/2022    2:58 PM 03/08/2022   11:35 AM  PHQ 2/9 Scores  PHQ - 2 Score 0 0   Most recent cognitive screening:    08/30/2022    3:00 PM  6CIT Screen  What Year? 0 points  What month? 0 points  What time? 0 points  Count back from 20 0 points  Months in reverse 0 points  Repeat phrase 0 points  Total Score 0 points     Results:   Studies obtained and personally reviewed by me:  Colonoscopy last completed 03/16/22. One 2 mm polyp in cecum, removed, one 2 mm polyp in rectum, removed, diverticulosis in sigmoid colon, internal hemorrhoids. Pathology showed one tubular adenoma, hyperplastic polyp. Examination otherwise normal. Recommended repeat in 2028.   Labs:       Component Value Date/Time   NA 141 08/26/2022 0920   NA 142 11/09/2018 1329   K 4.4 08/26/2022 0920   CL 104 08/26/2022 0920   CO2 27 08/26/2022 0920   GLUCOSE  112 (H) 08/26/2022 0920   BUN 15 08/26/2022 0920   BUN 9 11/09/2018 1329   CREATININE 1.28 08/26/2022 0920   CALCIUM 9.5 08/26/2022 0920   PROT 7.2 08/26/2022 0920   PROT 7.4 01/07/2019 0827   ALBUMIN 4.5 01/07/2019 0827   AST 25 08/26/2022 0920   ALT 21 08/26/2022 0920   ALKPHOS 56 01/07/2019 0827   BILITOT 0.4 08/26/2022 0920   BILITOT 0.3 01/07/2019 0827   GFRNONAA 71 08/24/2020 1202   GFRAA 83 08/24/2020 1202     Lab Results  Component Value Date   WBC 8.0 08/26/2022   HGB 15.1 08/26/2022   HCT 45.2 08/26/2022   MCV 86.9 08/26/2022   PLT 232 08/26/2022    Lab Results  Component Value Date   CHOL 131 08/26/2022   HDL 61 08/26/2022   LDLCALC 53 08/26/2022   TRIG 87 08/26/2022   CHOLHDL 2.1 08/26/2022    Lab Results  Component Value Date   HGBA1C 6.7 (H) 08/26/2022     Lab Results  Component Value Date   TSH 5.00 (H) 08/26/2022     Lab Results  Component Value Date   PSA 2.21 02/25/2022   PSA 2.31 08/24/2021   PSA 1.95 11/26/2020    Assessment & Plan:   Type 2 diabetes mellitus: treated with Glucophage 500 mg in the morning and 750 mg in the evening with a meal, Januvia 100 mg daily. HGBA1c at 6.7% on 08/26/22. Refilled Glucophage.  Work  on diet exercise and weight management.  Follow-up with hemoglobin A1c April 30.  Previous hemoglobin A1c was 6.1% September 2023.  Hypercholesterolemia: treated with Crestor 40 mg daily. Lipid panel normal on 08/26/22.  Vitamin D deficiency: treated with Vitamin D3 10,000 units daily at bedtime.  Colonoscopy: last completed 03/16/22. One 2 mm polyp in cecum, removed, one 2 mm polyp in rectum, removed, diverticulosis in sigmoid colon, internal hemorrhoids. Pathology showed one tubular adenoma, hyperplastic polyp. Examination otherwise normal. Recommended repeat in 2028.  Vaccine Counseling: UTD on flu, Covid-19, tetanus vaccines. Administered pneumococcal 20 vaccine.  Collected microalbumin/creatinine today.  Returning  09/01/22 for TSH, b-MET only. Return 10/11/22 for A1c check only.   Ascending aortic thoracic aneurysm-needs follow-up with CVTS  History of coronary artery bypass graft surgery June 2020  BMI 29.26-continue diet and exercise efforts  BPH treated with Flomax  Healthcare maintenance-received pneumococcal 20 vaccine here today.  Had colonoscopy October 2023.       Annual wellness visit done today including the all of the following: Reviewed patient's Family Medical History Reviewed and updated list of patient's medical providers Assessment of cognitive impairment was done Assessed patient's functional ability Established a written schedule for health screening [place] Completed and Reviewed  Discussed health benefits of physical activity, and encouraged him to engage in regular exercise appropriate for his age and condition.        I,Thomas Washington,acting as a Education administrator for Elby Showers, MD.,have documented all relevant documentation on the behalf of Elby Showers, MD,as directed by  Elby Showers, MD while in the presence of Elby Showers, MD.   I, Elby Showers, MD, have reviewed all documentation for this visit. The documentation on 08/30/22 for the exam, diagnosis, procedures, and orders are all accurate and complete.

## 2022-08-26 ENCOUNTER — Other Ambulatory Visit: Payer: Medicare HMO

## 2022-08-26 DIAGNOSIS — I10 Essential (primary) hypertension: Secondary | ICD-10-CM | POA: Diagnosis not present

## 2022-08-26 DIAGNOSIS — E785 Hyperlipidemia, unspecified: Secondary | ICD-10-CM

## 2022-08-26 DIAGNOSIS — R5383 Other fatigue: Secondary | ICD-10-CM

## 2022-08-26 DIAGNOSIS — E119 Type 2 diabetes mellitus without complications: Secondary | ICD-10-CM

## 2022-08-27 LAB — COMPLETE METABOLIC PANEL WITH GFR
AG Ratio: 1.5 (calc) (ref 1.0–2.5)
ALT: 21 U/L (ref 9–46)
AST: 25 U/L (ref 10–35)
Albumin: 4.3 g/dL (ref 3.6–5.1)
Alkaline phosphatase (APISO): 52 U/L (ref 35–144)
BUN: 15 mg/dL (ref 7–25)
CO2: 27 mmol/L (ref 20–32)
Calcium: 9.5 mg/dL (ref 8.6–10.3)
Chloride: 104 mmol/L (ref 98–110)
Creat: 1.28 mg/dL (ref 0.70–1.35)
Globulin: 2.9 g/dL (calc) (ref 1.9–3.7)
Glucose, Bld: 112 mg/dL — ABNORMAL HIGH (ref 65–99)
Potassium: 4.4 mmol/L (ref 3.5–5.3)
Sodium: 141 mmol/L (ref 135–146)
Total Bilirubin: 0.4 mg/dL (ref 0.2–1.2)
Total Protein: 7.2 g/dL (ref 6.1–8.1)
eGFR: 61 mL/min/{1.73_m2} (ref >=60)

## 2022-08-27 LAB — CBC WITH DIFFERENTIAL/PLATELET
Absolute Monocytes: 816 cells/uL (ref 200–950)
Basophils Absolute: 96 cells/uL (ref 0–200)
Basophils Relative: 1.2 %
Eosinophils Absolute: 408 cells/uL (ref 15–500)
Eosinophils Relative: 5.1 %
HCT: 45.2 % (ref 38.5–50.0)
Hemoglobin: 15.1 g/dL (ref 13.2–17.1)
Lymphs Abs: 2632 cells/uL (ref 850–3900)
MCH: 29 pg (ref 27.0–33.0)
MCHC: 33.4 g/dL (ref 32.0–36.0)
MCV: 86.9 fL (ref 80.0–100.0)
MPV: 9.7 fL (ref 7.5–12.5)
Monocytes Relative: 10.2 %
Neutro Abs: 4048 cells/uL (ref 1500–7800)
Neutrophils Relative %: 50.6 %
Platelets: 232 10*3/uL (ref 140–400)
RBC: 5.2 10*6/uL (ref 4.20–5.80)
RDW: 13.7 % (ref 11.0–15.0)
Total Lymphocyte: 32.9 %
WBC: 8 10*3/uL (ref 3.8–10.8)

## 2022-08-27 LAB — LIPID PANEL
Cholesterol: 131 mg/dL (ref <200)
HDL: 61 mg/dL (ref >=40)
LDL Cholesterol (Calc): 53 mg/dL (calc)
Non-HDL Cholesterol (Calc): 70 mg/dL (calc) (ref <130)
Total CHOL/HDL Ratio: 2.1 (calc) (ref <5.0)
Triglycerides: 87 mg/dL (ref <150)

## 2022-08-27 LAB — HEMOGLOBIN A1C
Hgb A1c MFr Bld: 6.7 % of total Hgb — ABNORMAL HIGH (ref <5.7)
Mean Plasma Glucose: 146 mg/dL
eAG (mmol/L): 8.1 mmol/L

## 2022-08-27 LAB — TSH: TSH: 5 mIU/L — ABNORMAL HIGH (ref 0.40–4.50)

## 2022-08-30 ENCOUNTER — Ambulatory Visit (INDEPENDENT_AMBULATORY_CARE_PROVIDER_SITE_OTHER): Payer: Medicare HMO | Admitting: Internal Medicine

## 2022-08-30 ENCOUNTER — Encounter: Payer: Self-pay | Admitting: Internal Medicine

## 2022-08-30 VITALS — BP 116/80 | HR 91 | Temp 98.1°F | Ht 67.0 in | Wt 186.8 lb

## 2022-08-30 DIAGNOSIS — I7121 Aneurysm of the ascending aorta, without rupture: Secondary | ICD-10-CM | POA: Diagnosis not present

## 2022-08-30 DIAGNOSIS — Z23 Encounter for immunization: Secondary | ICD-10-CM

## 2022-08-30 DIAGNOSIS — R351 Nocturia: Secondary | ICD-10-CM

## 2022-08-30 DIAGNOSIS — E782 Mixed hyperlipidemia: Secondary | ICD-10-CM

## 2022-08-30 DIAGNOSIS — Z951 Presence of aortocoronary bypass graft: Secondary | ICD-10-CM | POA: Diagnosis not present

## 2022-08-30 DIAGNOSIS — I10 Essential (primary) hypertension: Secondary | ICD-10-CM | POA: Diagnosis not present

## 2022-08-30 DIAGNOSIS — E785 Hyperlipidemia, unspecified: Secondary | ICD-10-CM

## 2022-08-30 DIAGNOSIS — Z6829 Body mass index (BMI) 29.0-29.9, adult: Secondary | ICD-10-CM | POA: Diagnosis not present

## 2022-08-30 DIAGNOSIS — Z0000 Encounter for general adult medical examination without abnormal findings: Secondary | ICD-10-CM | POA: Diagnosis not present

## 2022-08-30 DIAGNOSIS — E119 Type 2 diabetes mellitus without complications: Secondary | ICD-10-CM | POA: Diagnosis not present

## 2022-08-30 DIAGNOSIS — N401 Enlarged prostate with lower urinary tract symptoms: Secondary | ICD-10-CM

## 2022-08-30 DIAGNOSIS — E1169 Type 2 diabetes mellitus with other specified complication: Secondary | ICD-10-CM

## 2022-08-30 DIAGNOSIS — I2510 Atherosclerotic heart disease of native coronary artery without angina pectoris: Secondary | ICD-10-CM | POA: Diagnosis not present

## 2022-08-30 LAB — POCT URINALYSIS DIPSTICK
Bilirubin, UA: NEGATIVE
Blood, UA: NEGATIVE
Glucose, UA: NEGATIVE
Ketones, UA: NEGATIVE
Leukocytes, UA: NEGATIVE
Nitrite, UA: NEGATIVE
Protein, UA: NEGATIVE
Spec Grav, UA: 1.015 (ref 1.010–1.025)
Urobilinogen, UA: 0.2 E.U./dL
pH, UA: 5 (ref 5.0–8.0)

## 2022-08-30 MED ORDER — METFORMIN HCL 500 MG PO TABS: 500.0000 mg | ORAL_TABLET | Freq: Two times a day (BID) | ORAL | 2 refills | Status: DC

## 2022-08-30 NOTE — Patient Instructions (Addendum)
Needs follow-up with CVTS for ascending aortic thoracic aneurysm.  Return April 30 for hemoglobin A1c.  Pneumococcal 20 vaccine given today.  Take Glucophage 500 mg in the morning and 750 mg in the evening.  Continue Januvia 100 mg daily.  Continue to work on diet and exercise.  Noscapine is up-to-date.

## 2022-08-31 LAB — MICROALBUMIN / CREATININE URINE RATIO
Creatinine, Urine: 68 mg/dL (ref 20–320)
Microalb Creat Ratio: 79 mg/g creat — ABNORMAL HIGH (ref <30)
Microalb, Ur: 5.4 mg/dL

## 2022-09-01 ENCOUNTER — Other Ambulatory Visit: Payer: Medicare HMO

## 2022-09-01 ENCOUNTER — Telehealth: Payer: Self-pay | Admitting: Internal Medicine

## 2022-09-01 ENCOUNTER — Telehealth: Payer: Self-pay

## 2022-09-01 DIAGNOSIS — E079 Disorder of thyroid, unspecified: Secondary | ICD-10-CM | POA: Diagnosis not present

## 2022-09-01 DIAGNOSIS — Z125 Encounter for screening for malignant neoplasm of prostate: Secondary | ICD-10-CM | POA: Diagnosis not present

## 2022-09-01 DIAGNOSIS — R7989 Other specified abnormal findings of blood chemistry: Secondary | ICD-10-CM | POA: Diagnosis not present

## 2022-09-01 DIAGNOSIS — Z0000 Encounter for general adult medical examination without abnormal findings: Secondary | ICD-10-CM

## 2022-09-01 MED ORDER — METFORMIN HCL 500 MG PO TABS: 500.0000 mg | ORAL_TABLET | Freq: Two times a day (BID) | ORAL | 2 refills | Status: DC

## 2022-09-01 NOTE — Addendum Note (Signed)
Addended by: Geradine Girt D on: 09/01/2022 11:09 AM   Modules accepted: Orders

## 2022-09-01 NOTE — Telephone Encounter (Signed)
Thomas Washington called back and needs below medicine sent to   [place], [place] Phone: [phone]  Fax: [phone]      metFORMIN (GLUCOPHAGE) 500 MG tablet   It was sent to [facility] by mistake.

## 2022-09-01 NOTE — Addendum Note (Signed)
Addended by: Geradine Girt D on: 09/01/2022 03:32 PM   Modules accepted: Orders

## 2022-09-01 NOTE — Telephone Encounter (Signed)
Left detailed message for patient informing of scheduled CT appt on April 8 @ 3pm arriving @ 2:30 and if any questions to call the office

## 2022-09-02 LAB — BASIC METABOLIC PANEL
BUN: 14 mg/dL (ref 7–25)
CO2: 27 mmol/L (ref 20–32)
Calcium: 9.9 mg/dL (ref 8.6–10.3)
Chloride: 102 mmol/L (ref 98–110)
Creat: 1.11 mg/dL (ref 0.70–1.35)
Glucose, Bld: 164 mg/dL — ABNORMAL HIGH (ref 65–99)
Potassium: 4.7 mmol/L (ref 3.5–5.3)
Sodium: 139 mmol/L (ref 135–146)

## 2022-09-02 LAB — TSH: TSH: 7.46 mIU/L — ABNORMAL HIGH (ref 0.40–4.50)

## 2022-09-02 LAB — PSA: PSA: 2.46 ng/mL (ref <=4.00)

## 2022-09-02 MED ORDER — LEVOTHYROXINE SODIUM 100 MCG PO TABS: 100.0000 ug | ORAL_TABLET | Freq: Every day | ORAL | 1 refills | Status: DC

## 2022-09-02 NOTE — Addendum Note (Signed)
Addended by: Geradine Girt D on: 09/02/2022 09:51 AM   Modules accepted: Orders

## 2022-09-19 ENCOUNTER — Ambulatory Visit (HOSPITAL_COMMUNITY): Payer: Medicare HMO

## 2022-09-26 ENCOUNTER — Ambulatory Visit (HOSPITAL_COMMUNITY)
Admission: RE | Admit: 2022-09-26 | Discharge: 2022-09-26 | Disposition: A | Discharge status: Home / Self Care | Payer: Medicare HMO | Source: Ambulatory Visit | Attending: Internal Medicine | Admitting: Internal Medicine

## 2022-09-26 DIAGNOSIS — J9811 Atelectasis: Secondary | ICD-10-CM | POA: Diagnosis not present

## 2022-09-26 DIAGNOSIS — I2510 Atherosclerotic heart disease of native coronary artery without angina pectoris: Secondary | ICD-10-CM | POA: Diagnosis not present

## 2022-09-26 MED ORDER — IOHEXOL 350 MG/ML SOLN
75.0000 mL | Freq: Once | INTRAVENOUS | Status: AC | PRN
  Administered 2022-09-26: 75 mL via INTRAVENOUS

## 2022-10-03 ENCOUNTER — Encounter: Payer: Self-pay | Admitting: Cardiovascular Disease

## 2022-10-03 NOTE — Progress Notes (Signed)
Thomas Washington Date of Birth  12-12-1950       [facility] Office [address]  [address], [address] [place], [place]  [address]   [place], [place]  [address] [phone]     [phone]   Fax  [phone]    Fax [phone]  Problem List: 1. Pericarditis 2. Hyperlipidemia 3. Hypertension 4. Diabetes mellitus   Previous Notes.  Thomas Washington is a 72 yo who developed CP recently 2 days ago .  Very tender to the touch.  Felt like rib pain.  Thought it was indigestion initially.   Was positional , chest pain eased with sitting forward.  Worse with deep breath.  He drove himself to the ER. D-dimer was normal < 0.27.  Troponin was normal.  He took Motrin and feels much better.    + cough, ( dry)   No fevers, no chills, no  Some dyspnea with walking.  Strong family history of CAD - father died at age 54 of MI.   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  HbA1c is 6.9  He had a stress myoview which was normal. An echocardiogram which revealed normal left ventricular systolic function. He was found to have some diastolic dysfunction.  His back doing all of his normal activities without difficulty.  Sept. 14, 2020:  Thomas Washington is seen today for follow-up visit.  He was found to have coronary artery disease.  Is admitted in early June and had coronary artery bypass grafting. Doing great.  No angina .   Still some MSK pain  Exercising well   September 03, 2019:  Thomas Washington is seen today for follow-up visit.  He has a history of coronary artery disease and coronary artery bypass grafting in June, 2020. Exercising regularly .  No CP,  No chest wall tenderness.    Planning on going bear hunting in [place] in several months .  Is tolerating the metoprolol better now.   Reviewed lipids - numbers look great.   August 31, 2020:   Thomas Washington is seen today for follow up for his CAD, CABG in June 2020   Has not been exercising  Some yard  work ,   Building a free standing tree stand in [place]   September 13, 2021:  Thomas Washington is seen today for follow-up of his coronary artery disease, coronary artery bypass grafting in June 2020. No angina   Labs from March, 2023 show a total cholesterol of 142.  HDL is 57.  LDL is 63.  Triglyceride level is 135.  Hemoglobin A1c is 6.3.  Hemoglobin is 15.5.  Creatinine is 1.07.  Walks 3 times a week - 1.5  miles, encouraged him to walk further    October 04, 2022, Thomas Washington is seen for follow up of his CAD , CABG in  JUne 2020 No CP , no dyspnea .  Labs from August 26, 2022 include Total cholesterol 131 HDL is 61 LDL is 53 Triglyceride level is 87  He stopped his metoprolol due to orthostasis  Asc. Aortic aneurism is 4.3 CM    Current Outpatient Medications on File Prior to Visit  Medication Sig Dispense Refill   ACCU-CHEK FASTCLIX LANCETS MISC Use as instructed to test blood sugar daily 100 each 2   acetaminophen (TYLENOL) 500 MG tablet Take 2 tablets (1,000 mg total) by mouth every 6 (six) hours. 30 tablet 0   aspirin  EC 81 MG tablet Take 1 tablet (81 mg total) by mouth daily.     B Complex Vitamins (B COMPLEX PO) Take 1 tablet by mouth daily.      BIOTIN PO Take by mouth.     Blood Glucose Monitoring Suppl (ACCU-CHEK AVIVA PLUS) w/Device KIT Use as instructed to test blood sugar daily 1 kit 0   Cholecalciferol (VITAMIN D3) 250 MCG (10000 UT) capsule Take 10,000 Units by mouth at bedtime.     Coenzyme Q10 (CO Q 10 PO) Take by mouth.     folic acid (FOLVITE) 1 MG tablet Take 1 tablet (1 mg total) by mouth daily. 90 tablet 3   glucose blood (ACCU-CHEK ACTIVE STRIPS) test strip Use as instructed to test blood sugar daily 100 each 2   JANUVIA 100 MG tablet TAKE 1 TABLET BY MOUTH EVERY DAY 90 tablet 3   levothyroxine (SYNTHROID) 100 MCG tablet Take 1 tablet (100 mcg total) by mouth daily. 90 tablet 1   metFORMIN (GLUCOPHAGE) 1000 MG tablet Take 0.5-1 tablets (500-1,000 mg total) by mouth See  admin instructions. Take 500 mg in the morning and 1000 mg in the evening (Patient taking differently: Take 1,000 mg by mouth. 500 mg in morning and 750 mg in the evening)     ONETOUCH VERIO test strip USE DAILY AS DIRECTED 100 strip PRN   tamsulosin (FLOMAX) 0.4 MG CAPS capsule Take 0.4 mg by mouth daily.     [DISCONTINUED] tamsulosin (FLOMAX) 0.4 MG CAPS capsule Take 0.4 mg by mouth daily after supper.     No current facility-administered medications on file prior to visit.    Allergies  Allergen Reactions   Quinolones Other (See Comments)    Patient was warned about not using Cipro and similar antibiotics. Recent studies have raised concern that fluoroquinolone antibiotics could be associated with an increased risk of aortic aneurysm Fluoroquinolones have non-antimicrobial properties that might jeopardise the integrity of the extracellular matrix of the vascular wall In a  propensity score matched cohort study in [place], there was a 66% increased rate of aortic aneurysm or dissection associated with oral fluoroquinolone use, compared wit   Levaquin [Levofloxacin] Other (See Comments)    Due to heart condition     Past Medical History:  Diagnosis Date   Anginal pain    Coronary artery disease    Diabetes mellitus without complication    History of kidney stones    Hypercholesteremia    MRSA (methicillin resistant staph aureus) culture positive     Past Surgical History:  Procedure Laterality Date   CARDIAC CATHETERIZATION     COLONOSCOPY  03/29/2017   Dr.Perry   CORONARY ARTERY BYPASS GRAFT N/A 11/21/2018   Procedure: CORONARY ARTERY BYPASS GRAFTING (CABG), ON PUMP, TIMES 4, USING LEFT INTERNAL MAMMARY ARTERY AND RIGHT GREATER SAPHENOUS VEIN, LIMA TO LAD, RIGHT SAPHENOUS VEIN TO OM2, RIGHT SAPHENOUS VEIN TO DISTAL CIRC, RIGHT SAPHENOUS VEIN TO ACUTE MARGINAL;  Surgeon: Delight Ovens, MD;  Location: [facility] OR;  Service: Open Heart Surgery;  Laterality: N/A;   LEFT HEART CATH AND  CORONARY ANGIOGRAPHY N/A 11/14/2018   Procedure: LEFT HEART CATH AND CORONARY ANGIOGRAPHY;  Surgeon: Corky Crafts, MD;  Location: [facility] INVASIVE CV LAB;  Service: Cardiovascular;  Laterality: N/A;   TEE WITHOUT CARDIOVERSION N/A 11/21/2018   Procedure: TRANSESOPHAGEAL ECHOCARDIOGRAM (TEE);  Surgeon: Delight Ovens, MD;  Location: [facility] OR;  Service: Open Heart Surgery;  Laterality: N/A;   VASECTOMY  WISDOM TOOTH EXTRACTION      Social History   Tobacco Use  Smoking Status Never  Smokeless Tobacco Never    Social History   Substance and Sexual Activity  Alcohol Use Yes   Comment: rare    Family History  Problem Relation Age of Onset   Stroke Mother    Heart attack Father    Healthy Sister    Healthy Daughter    Colon cancer Neg Hx    Colon polyps Neg Hx    Esophageal cancer Neg Hx    Rectal cancer Neg Hx    Stomach cancer Neg Hx     Reviw of Systems:  Reviewed in the HPI.  All other systems are negative.  Physical Exam: Blood pressure 128/78, pulse 66, height 5\' 7"  (1.702 m), weight 186 lb (84.4 kg), SpO2 97 %.       GEN:  Well nourished, well developed in no acute distress HEENT: Normal NECK: No JVD; No carotid bruits LYMPHATICS: No lymphadenopathy CARDIAC: RRR , no murmurs, rubs, gallops RESPIRATORY:  Clear to auscultation without rales, wheezing or rhonchi  ABDOMEN: Soft, non-tender, non-distended MUSCULOSKELETAL:  No edema; No deformity  SKIN: Warm and dry NEUROLOGIC:  Alert and oriented x 3    ECG: October 04, 2022: Normal sinus rhythm at 66.  Moderate criteria for left ventricular perjury.  Previous inferior wall myocardial infarction.  No changes from previous EKG.  Assessment / Plan  1.  CAD:   S/p CABG.    doing well.  No angina.   2.  Hyperlipidemia: Lipids look well-controlled.  Continue current dose of rosuvastatin    Return in 1 year to see me or APP   Kristeen Miss, MD  10/04/2022 10:05 AM    [facility]  [facility] [address] [place],  [address] [place], [place]  [address] Pager [phone] Phone: [phone]; Fax: [phone]

## 2022-10-04 ENCOUNTER — Encounter: Payer: Self-pay | Admitting: Cardiovascular Disease

## 2022-10-04 ENCOUNTER — Ambulatory Visit: Payer: Medicare HMO | Attending: Cardiovascular Disease | Admitting: Cardiovascular Disease

## 2022-10-04 VITALS — BP 128/78 | HR 66 | Ht 67.0 in | Wt 186.0 lb

## 2022-10-04 DIAGNOSIS — I2510 Atherosclerotic heart disease of native coronary artery without angina pectoris: Secondary | ICD-10-CM | POA: Diagnosis not present

## 2022-10-04 DIAGNOSIS — E785 Hyperlipidemia, unspecified: Secondary | ICD-10-CM

## 2022-10-04 MED ORDER — ROSUVASTATIN CALCIUM 40 MG PO TABS
40.0000 mg | ORAL_TABLET | Freq: Every day | ORAL | 3 refills | Status: DC
Start: 2022-10-04 — End: 2023-09-15

## 2022-10-04 NOTE — Patient Instructions (Signed)
Medication Instructions:  Your physician recommends that you continue on your current medications as directed. Please refer to the Current Medication list given to you today.  *If you need a refill on your cardiac medications before your next appointment, please call your pharmacy*   Lab Work: NONE If you have labs (blood work) drawn today and your tests are completely normal, you will receive your results only by: MyChart Message (if you have MyChart) OR A paper copy in the mail If you have any lab test that is abnormal or we need to change your treatment, we will call you to review the results.   Testing/Procedures: NONE   Follow-Up: At [facility], you and your health needs are our priority.  As part of our continuing mission to provide you with exceptional heart care, we have created designated Provider Care Teams.  These Care Teams include your primary Cardiologist (physician) and Advanced Practice Providers (APPs -  Physician Assistants and Nurse Practitioners) who all work together to provide you with the care you need, when you need it.  We recommend signing up for the patient portal called "MyChart".  Sign up information is provided on this After Visit Summary.  MyChart is used to connect with patients for Virtual Visits (Telemedicine).  Patients are able to view lab/test results, encounter notes, upcoming appointments, etc.  Non-urgent messages can be sent to your provider as well.   To learn more about what you can do with MyChart, go to [email or web address].    Your next appointment:   1 year(s)  Provider:   Philip Nahser, MD      

## 2022-10-06 NOTE — Progress Notes (Signed)
Patient Care Team: Margaree Mackintosh, MD as PCP - General (Internal Medicine) Nahser, Deloris Ping, MD as PCP - Cardiology (Cardiology)  Visit Date: 10/13/22  Subjective:    Patient ID: Thomas Washington , Male   DOB: 1950/08/18, 72 y.o.    MRN: [id number]   72 y.o. Male presents today for a TSH, A1c recheck. Patient has a past medical history of anginal pain, coronary artery disease, Type 2 diabetes mellitus, kidney stones, hyperlipidemia, methicillin resistant staph aureus.  History of Type 2 diabetes treated with metformin 500 mg in the morning and 750 mg in the evening, Januvia 100 mg daily. HGBA1c at 6.5% on 10/11/22, down from 6.7% on 08/26/22. Denies night sweats, hypoglycemia symptoms.  History of hypothyroidism treated with levothyroxine 100 mcg daily. TSH at 0.24 on 10/11/22, down from 7.46 on 09/01/22.  09/26/22 CT angiography chest with contrast showed no pulmonary embolism, acute intrathoracic pathology, 2) status post coronary artery bypass grafting, 3) stable fusiform aneurysm of ascending aorta with maximal diameter of 4.3 cm, 4) mild aortic atherosclerosis.  Past Medical History:  Diagnosis Date   Anginal pain (HCC)    Coronary artery disease    Diabetes mellitus without complication (HCC)    History of kidney stones    Hypercholesteremia    MRSA (methicillin resistant staph aureus) culture positive      Family History  Problem Relation Age of Onset   Stroke Mother    Heart attack Father    Healthy Sister    Healthy Daughter    Colon cancer Neg Hx    Colon polyps Neg Hx    Esophageal cancer Neg Hx    Rectal cancer Neg Hx    Stomach cancer Neg Hx     Social History   Social History Narrative   Social history: Married with 1 adult daughter.  One grandson.  Another grandchild on the way.  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  Native of [place].  Wife is a retired family Conservation officer, nature.       Family history: Mother with  history of stroke died with complications of dementia and stroke.  Father died at age 63 suddenly presumably of a sudden MI.  1 sister in good health.          Review of Systems  Constitutional:  Negative for diaphoresis (Night), fever and malaise/fatigue.  HENT:  Negative for congestion.   Eyes:  Negative for blurred vision.  Respiratory:  Negative for cough and shortness of breath.   Cardiovascular:  Negative for chest pain, palpitations and leg swelling.  Gastrointestinal:  Negative for vomiting.  Musculoskeletal:  Negative for back pain.  Skin:  Negative for rash.  Neurological:  Negative for loss of consciousness and headaches.        Objective:   Vitals: BP 110/76   Pulse 86   Temp 98 F (36.7 C) (Tympanic)   Ht 5\' 9"  (1.753 m)   Wt 184 lb (83.5 kg)   SpO2 98%   BMI 27.17 kg/m    Physical Exam Vitals and nursing note reviewed.  Constitutional:      General: He is not in acute distress.    Appearance: Normal appearance. He is not ill-appearing.  HENT:     Head: Normocephalic and atraumatic.  Pulmonary:     Effort: Pulmonary effort is normal.  Skin:    General: Skin is warm and dry.  Neurological:     Mental  Status: He is alert and oriented to person, place, and time. Mental status is at baseline.  Psychiatric:        Mood and Affect: Mood normal.        Behavior: Behavior normal.        Thought Content: Thought content normal.        Judgment: Judgment normal.       Results:   Studies obtained and personally reviewed by me:   Labs:       Component Value Date/Time   NA 139 09/01/2022 0957   NA 142 11/09/2018 1329   K 4.7 09/01/2022 0957   CL 102 09/01/2022 0957   CO2 27 09/01/2022 0957   GLUCOSE 164 (H) 09/01/2022 0957   BUN 14 09/01/2022 0957   BUN 9 11/09/2018 1329   CREATININE 1.11 09/01/2022 0957   CALCIUM 9.9 09/01/2022 0957   PROT 7.2 08/26/2022 0920   PROT 7.4 01/07/2019 0827   ALBUMIN 4.5 01/07/2019 0827   AST 25 08/26/2022 0920    ALT 21 08/26/2022 0920   ALKPHOS 56 01/07/2019 0827   BILITOT 0.4 08/26/2022 0920   BILITOT 0.3 01/07/2019 0827   GFRNONAA 71 08/24/2020 1202   GFRAA 83 08/24/2020 1202     Lab Results  Component Value Date   WBC 8.0 08/26/2022   HGB 15.1 08/26/2022   HCT 45.2 08/26/2022   MCV 86.9 08/26/2022   PLT 232 08/26/2022    Lab Results  Component Value Date   CHOL 131 08/26/2022   HDL 61 08/26/2022   LDLCALC 53 08/26/2022   TRIG 87 08/26/2022   CHOLHDL 2.1 08/26/2022    Lab Results  Component Value Date   HGBA1C 6.5 (H) 10/11/2022     Lab Results  Component Value Date   TSH 0.24 (L) 10/11/2022     Lab Results  Component Value Date   PSA 2.46 09/01/2022   PSA 2.21 02/25/2022   PSA 2.31 08/24/2021      Assessment & Plan:   Type 2 diabetes: treated with metformin 500 mg in the morning and 750 mg in the evening, Januvia 100 mg daily. HGBA1c at 6.5% on 10/11/22, down from 6.7% on 08/26/22.  Hypothyroidism: treated with levothyroxine 100 mcg daily. TSH at 0.24 on 10/11/22, down from 7.46 on 09/01/22. Change Levothyroxine to  75 mcg daily.  09/26/22 CT angiography chest with contrast showed no pulmonary embolism, acute intrathoracic pathology, 2) status post coronary artery bypass grafting, 3) stable fusiform aneurysm of ascending aorta with maximal diameter of 4.3 cm, 4) mild aortic atherosclerosis.  Return in 6 weeks for trough TSH June 13    I,Alexander Ruley,acting as a Neurosurgeon for Margaree Mackintosh, MD.,have documented all relevant documentation on the behalf of Margaree Mackintosh, MD,as directed by  Margaree Mackintosh, MD while in the presence of Margaree Mackintosh, MD.   I, Margaree Mackintosh, MD, have reviewed all documentation for this visit. The documentation on 10/13/22 for the exam, diagnosis, procedures, and orders are all accurate and complete.

## 2022-10-11 ENCOUNTER — Other Ambulatory Visit: Payer: Medicare HMO

## 2022-10-11 DIAGNOSIS — R7989 Other specified abnormal findings of blood chemistry: Secondary | ICD-10-CM

## 2022-10-11 DIAGNOSIS — E119 Type 2 diabetes mellitus without complications: Secondary | ICD-10-CM | POA: Diagnosis not present

## 2022-10-12 ENCOUNTER — Institutional Professional Consult (permissible substitution): Payer: Medicare HMO | Admitting: Physician Assistant

## 2022-10-12 VITALS — BP 127/77 | HR 76 | Resp 20 | Ht 69.0 in | Wt 178.0 lb

## 2022-10-12 DIAGNOSIS — I7121 Aneurysm of the ascending aorta, without rupture: Secondary | ICD-10-CM | POA: Diagnosis not present

## 2022-10-12 LAB — HEMOGLOBIN A1C
Hgb A1c MFr Bld: 6.5 % of total Hgb — ABNORMAL HIGH (ref <5.7)
Mean Plasma Glucose: 140 mg/dL
eAG (mmol/L): 7.7 mmol/L

## 2022-10-12 LAB — TSH: TSH: 0.24 mIU/L — ABNORMAL LOW (ref 0.40–4.50)

## 2022-10-12 NOTE — Progress Notes (Signed)
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 Nydia Bouton, dyslipidemia managed with Crestor, and hypothyroidism, who is followed by Dr. Elease Hashimoto.  He has not been seen since our practice since his follow-up visit following coronary bypass grafting in June 2020.  He was noted on CTA in 2020 to have a 4.5 cm ascending thoracic aneurysm.  Echocardiogram done about the same time showed normal left ventricular ejection fraction within normal trileaflet aortic valve and no other valvular abnormalities.  The ascending aorta measured 40 mm by echo.  Thomas Washington was referred to [place] for follow-up and surveillance of his thoracic aortic aneurysm by his PCP.  Denies any chest pain or shortness of breath.  He continues to live an active lifestyle and exercises regularly.  He stopped taking the metoprolol early post CABG due to orthostatic hypotension.  His blood pressure remains well-controlled by being on no antihypertensive medications. 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   Past Medical History:  Diagnosis Date   Anginal pain (HCC)    Coronary artery disease    Diabetes mellitus without complication (HCC)    History of kidney stones    Hypercholesteremia    MRSA (methicillin resistant staph aureus) culture positive     Past Surgical History:  Procedure Laterality Date   CARDIAC CATHETERIZATION     COLONOSCOPY  03/29/2017   Dr.Perry   CORONARY ARTERY BYPASS GRAFT N/A 11/21/2018   Procedure: CORONARY ARTERY BYPASS GRAFTING (CABG), ON PUMP, TIMES 4, USING LEFT  INTERNAL MAMMARY ARTERY AND RIGHT GREATER SAPHENOUS VEIN, LIMA TO LAD, RIGHT SAPHENOUS VEIN TO OM2, RIGHT SAPHENOUS VEIN TO DISTAL CIRC, RIGHT SAPHENOUS VEIN TO ACUTE MARGINAL;  Surgeon: Delight Ovens, MD;  Location: [facility] OR;  Service: Open Heart Surgery;  Laterality: N/A;   LEFT HEART CATH AND CORONARY ANGIOGRAPHY N/A 11/14/2018   Procedure: LEFT HEART CATH AND CORONARY ANGIOGRAPHY;  Surgeon: Corky Crafts, MD;  Location: [facility] INVASIVE CV LAB;  Service: Cardiovascular;  Laterality: N/A;   TEE WITHOUT CARDIOVERSION N/A 11/21/2018   Procedure: TRANSESOPHAGEAL ECHOCARDIOGRAM (TEE);  Surgeon: Delight Ovens, MD;  Location: [facility] OR;  Service: Open Heart Surgery;  Laterality: N/A;   VASECTOMY     WISDOM TOOTH EXTRACTION      Family History  Problem Relation Age of Onset   Stroke Mother    Heart attack Father    Healthy Sister    Healthy Daughter    Colon cancer Neg Hx    Colon polyps Neg Hx    Esophageal cancer Neg Hx    Rectal cancer Neg Hx    Stomach cancer Neg Hx     Social History Social History   Tobacco Use   Smoking status: Never   Smokeless tobacco: Never  Vaping Use   Vaping Use: Never used  Substance Use Topics   Alcohol use: Yes    Comment: rare   Drug use: No    Current Outpatient Medications  Medication Sig  Dispense Refill   ACCU-CHEK FASTCLIX LANCETS MISC Use as instructed to test blood sugar daily 100 each 2   acetaminophen (TYLENOL) 500 MG tablet Take 2 tablets (1,000 mg total) by mouth every 6 (six) hours. 30 tablet 0   aspirin EC 81 MG tablet Take 1 tablet (81 mg total) by mouth daily.     B Complex Vitamins (B COMPLEX PO) Take 1 tablet by mouth daily.      BIOTIN PO Take by mouth.     Blood Glucose Monitoring Suppl (ACCU-CHEK AVIVA PLUS) w/Device KIT Use as instructed to test blood sugar daily 1 kit 0   Cholecalciferol (VITAMIN D3) 250 MCG (10000 UT) capsule Take 10,000 Units by mouth at bedtime.     Coenzyme Q10 (CO Q 10 PO) Take by mouth.      folic acid (FOLVITE) 1 MG tablet Take 1 tablet (1 mg total) by mouth daily. 90 tablet 3   glucose blood (ACCU-CHEK ACTIVE STRIPS) test strip Use as instructed to test blood sugar daily 100 each 2   JANUVIA 100 MG tablet TAKE 1 TABLET BY MOUTH EVERY DAY 90 tablet 3   levothyroxine (SYNTHROID) 100 MCG tablet Take 1 tablet (100 mcg total) by mouth daily. 90 tablet 1   metFORMIN (GLUCOPHAGE) 1000 MG tablet Take 0.5-1 tablets (500-1,000 mg total) by mouth See admin instructions. Take 500 mg in the morning and 1000 mg in the evening (Patient taking differently: Take 1,000 mg by mouth. 500 mg in morning and 750 mg in the evening)     ONETOUCH VERIO test strip USE DAILY AS DIRECTED 100 strip PRN   rosuvastatin (CRESTOR) 40 MG tablet Take 1 tablet (40 mg total) by mouth daily. 90 tablet 3   tamsulosin (FLOMAX) 0.4 MG CAPS capsule Take 0.4 mg by mouth daily.     No current facility-administered medications for this visit.    Allergies  Allergen Reactions   Quinolones Other (See Comments)    Patient was warned about not using Cipro and similar antibiotics. Recent studies have raised concern that fluoroquinolone antibiotics could be associated with an increased risk of aortic aneurysm Fluoroquinolones have non-antimicrobial properties that might jeopardise the integrity of the extracellular matrix of the vascular wall In a  propensity score matched cohort study in [place], there was a 66% increased rate of aortic aneurysm or dissection associated with oral fluoroquinolone use, compared wit   Levaquin [Levofloxacin] Other (See Comments)    Due to heart condition     Review of Systems: Review of Systems  HENT:  Positive for hearing loss.   Eyes:  Positive for blurred vision.  Genitourinary:  Positive for frequency.     Physical Exam:  Vital signs BP 127/77 Pulse 76 Respirations 20 SpO2 96% on room air  General: Alert and oriented.  Walks with a steady gait Skin: Warm and dry Neck: No JVD or  carotid bruit Chest: Breath sounds full, clear.  Sternotomy incision is well-healed.  Sternum is stable. Extremities: No deformities, no peripheral edema Neuro: No gross deficits.  Diagnostic Tests: CLINICAL DATA:  Coronary artery disease   EXAM: CT ANGIOGRAPHY CHEST WITH CONTRAST   TECHNIQUE: Multidetector CT imaging of the chest was performed using the standard protocol during bolus administration of intravenous contrast. Multiplanar CT image reconstructions and MIPs were obtained to evaluate the vascular anatomy.   RADIATION DOSE REDUCTION: This exam was performed according to the departmental dose-optimization program which includes automated exposure control, adjustment of the mA and/or kV according  to patient size and/or use of iterative reconstruction technique.   CONTRAST:  75mL OMNIPAQUE IOHEXOL 350 MG/ML SOLN   COMPARISON:  11/19/2018   FINDINGS: Cardiovascular: Status post coronary artery bypass grafting. Global cardiac size within normal limits. No pericardial effusion. Central pulmonary arteries are of normal caliber. No intraluminal filling defect identified through the segmental level to suggest acute pulmonary embolism. Mild atherosclerotic calcification within the thoracic aorta. Fusiform dilation of the ascending aorta with maximal transaxial diameter of 4.3 cm, stable since prior examination. Descending thoracic aorta is of normal caliber.   Mediastinum/Nodes: No enlarged mediastinal, hilar, or axillary lymph nodes. Thyroid gland, trachea, and esophagus demonstrate no significant findings.   Lungs/Pleura: Stable elevation of the right hemidiaphragm with associated right basilar atelectasis. Lungs are otherwise clear. No pneumothorax or pleural effusion. No central obstructing lesion.   Upper Abdomen: No acute abnormality.   Musculoskeletal: No chest wall abnormality. No acute or significant osseous findings.   Review of the MIP images confirms the  above findings.   IMPRESSION: 1. No pulmonary embolism. No acute intrathoracic pathology identified. 2. Status post coronary artery bypass grafting. 3. Stable fusiform aneurysm of the ascending aorta with maximal diameter of 4.3 cm. Recommend annual imaging followup by CTA or MRA. This recommendation follows 2010 ACCF/AHA/AATS/ACR/ASA/SCA/SCAI/SIR/STS/SVM Guidelines for the Diagnosis and Management of Patients with Thoracic Aortic Disease. Circulation. 2010; 121: O962-X528. Aortic aneurysm NOS (ICD10-I71.9) . 4. Mild aortic atherosclerosis.   Aortic Atherosclerosis (ICD10-I70.0).     Electronically Signed   By: Helyn Numbers M.D.   On: 09/26/2022 20:43   ECHOCARDIOGRAM REPORT    Patient Name:   Thomas Washington Date of Exam: 11/16/2018  Medical Rec #:  [id number]       Height:       69.0 in  Accession #:    [id number]      Weight:       184.0 lb  Date of Birth:  [birth year]       BSA:          1.99 m  Patient Age:    68 years        BP:           145/93 mmHg  Patient Gender: M               HR:           55 bpm.  Exam Location:  Outpatient     Procedure: 2D Echo, Cardiac Doppler, Color Doppler and Strain Analysis   Indications:    CAD Native Vessel 414.01/ 125.10    History:        Patient has prior history of Echocardiogram examinations,  most                 recent 09/13/2012. Acute pericarditis CAD Risk Factors:                  Hypertension, Dyslipidemia and Non-Smoker.    Sonographer:    Tonia Ghent RDCS  Referring Phys: Delight Ovens   IMPRESSIONS     1. The left ventricle has normal systolic function with an ejection  fraction of 60-65%. The cavity size was normal. Left ventricular diastolic  Doppler parameters are consistent with impaired relaxation.   2. The right ventricle has normal systolic function. The cavity was  normal. There is no increase in right ventricular wall thickness.   3. There is dilatation of the aortic root measuring 40 mm.   4.  There is no evidence of pericardial effusion.   FINDINGS   Left Ventricle: The left ventricle has normal systolic function, with an  ejection fraction of 60-65%. The cavity size was normal. There is no  increase in left ventricular wall thickness. Left ventricular diastolic  Doppler parameters are consistent with  impaired relaxation.   Right Ventricle: The right ventricle has normal systolic function. The  cavity was normal. There is no increase in right ventricular wall  thickness.   Left Atrium: Left atrial size was normal in size.   Right Atrium: Right atrial size was normal in size. Right atrial pressure  is estimated at 3 mmHg.   Interatrial Septum: No atrial level shunt detected by color flow Doppler.   Pericardium: There is no evidence of pericardial effusion.   Mitral Valve: The mitral valve is normal in structure. Mitral valve  regurgitation is mild by color flow Doppler.   Tricuspid Valve: The tricuspid valve is normal in structure. Tricuspid  valve regurgitation was not visualized by color flow Doppler.   Aortic Valve: The aortic valve is normal in structure. Aortic valve  regurgitation was not visualized by color flow Doppler.   Pulmonic Valve: The pulmonic valve was normal in structure. Pulmonic valve  regurgitation is trivial by color flow Doppler.   Aorta: There is dilatation of the aortic root measuring 40 mm.   Venous: The inferior vena cava is normal in size with greater than 50%  respiratory variability.     +--------------+--------++  LEFT VENTRICLE              +----------------+---------++  +--------------+--------++      Diastology                 PLAX 2D                     +----------------+---------++  +--------------+--------++      LV e' lateral:  6.96 cm/s  LVIDd:        4.30 cm       +----------------+---------++  +--------------+--------++      LV E/e' lateral:9.8        LVIDs:        3.10 cm        +----------------+---------++  +--------------+--------++      LV e' medial:   5.33 cm/s  LV PW:        1.00 cm       +----------------+---------++  +--------------+--------++      LV E/e' medial: 12.8       LV IVS:       1.00 cm       +----------------+---------++  +--------------+--------++  LVOT diam:    1.90 cm       +----------------------+-------++  +--------------+--------++      2D Longitudinal Strain         LV SV:        45 ml         +----------------------+-------++  +--------------+--------++      2D Strain GLS Avg:    -20.5 %  LV SV Index:  22.23         +----------------------+-------++  +--------------+--------++  LVOT Area:    2.84 cm  +--------------+--------++                         +--------------+--------++    +------------------+---------++  LV Volumes (MOD)             +------------------+---------++  LV  area d, A2C:   31.00 cm  +------------------+---------++  LV area d, A4C:   29.30 cm  +------------------+---------++  LV area s, A2C:   18.10 cm  +------------------+---------++  LV area s, A4C:   17.00 cm  +------------------+---------++  LV major d, A2C:  7.94 cm    +------------------+---------++  LV major d, A4C:  8.15 cm    +------------------+---------++  LV major s, A2C:  6.67 cm    +------------------+---------++  LV major s, A4C:  6.34 cm    +------------------+---------++  LV vol d, MOD A2C:100.0 ml   +------------------+---------++  LV vol d, MOD A4C:86.2 ml    +------------------+---------++  LV vol s, MOD A2C:41.3 ml    +------------------+---------++  LV vol s, MOD A4C:38.9 ml    +------------------+---------++  LV SV MOD A2C:    58.7 ml    +------------------+---------++  LV SV MOD A4C:    86.2 ml    +------------------+---------++  LV SV MOD BP:     53.0 ml    +------------------+---------++    +---------------+----------++  RIGHT VENTRICLE            +---------------+----------++  RV S prime:    12.80 cm/s  +---------------+----------++  TAPSE (M-mode):1.9 cm      +---------------+----------++   +---------------+-------++-----------++  LEFT ATRIUM           Index        +---------------+-------++-----------++  LA diam:       3.90 cm1.96 cm/m   +---------------+-------++-----------++  LA Vol (A2C):  44.7 ml22.42 ml/m  +---------------+-------++-----------++  LA Vol (A4C):  31.2 ml15.65 ml/m  +---------------+-------++-----------++  LA Biplane Vol:38.3 ml19.21 ml/m  +---------------+-------++-----------++  +------------+---------++-----------++  RIGHT ATRIUM         Index        +------------+---------++-----------++  RA Area:    12.30 cm             +------------+---------++-----------++  RA Volume:  21.80 ml 10.93 ml/m  +------------+---------++-----------++   +------------+-----------++  AORTIC VALVE             +------------+-----------++  LVOT Vmax:  109.00 cm/s  +------------+-----------++  LVOT Vmean: 77.200 cm/s  +------------+-----------++  LVOT VTI:   0.265 m      +------------+-----------++    +-------------+-------++  AORTA                +-------------+-------++  Ao Root diam:3.10 cm  +-------------+-------++   +--------------+----------++  MITRAL VALVE             +--------------+-------+  +--------------+----------++ SHUNTS                 MV Area (PHT):3.27 cm   +--------------+-------+  +--------------+----------++ Systemic VTI: 0.26 m   MV PHT:       67.28 msec +--------------+-------+  +--------------+----------++ Systemic Diam:1.90 cm  MV Decel Time:232 msec   +--------------+-------+  +--------------+----------++  +--------------+----------++  MV E velocity:68.10 cm/s  +--------------+----------++  MV A  velocity:75.00 cm/s  +--------------+----------++  MV E/A ratio: 0.91        +--------------+----------++     Donato Schultz MD  Electronically signed by Donato Schultz MD  Signature Date/Time: 11/16/2018/1:53:03 PM       Impression Roosvelt Harps: 72 year old male with the above described past medical history with a known thoracic aortic aneurysm since 2020.  Follow-up CTA obtained 2 weeks ago shows the ascending thoracic aneurysm to be stable at 43 mm.  There is no indication for surgical intervention at this point.  He understands the importance  of ongoing surveillance.  We also discussed recommendations to limit repetitive strenuous activity with lifting, pushing, or pulling greater than 40 pounds.  He is aware of the recommendation to avoid fluoroquinolone class of antibiotics due to their tendency to weaken connective tissues.  He is encouraged to continue blood pressure management and continue statin therapy. Recommend follow-up with in 1 year with CTA chest.   Leary Roca, PA-C [facility] [phone]

## 2022-10-12 NOTE — Patient Instructions (Signed)
Keep a close watch on your blood pressure with target of less than 130/80.  Avoid the fluoroquinolone class of antibiotics.  Avoid repetitive strenuous activity such as contact sports or lifting, pushing, or pulling greater than 40 pounds  Follow-up in 1 year with CTA chest.

## 2022-10-13 ENCOUNTER — Ambulatory Visit (INDEPENDENT_AMBULATORY_CARE_PROVIDER_SITE_OTHER): Payer: Medicare HMO | Admitting: Internal Medicine

## 2022-10-13 ENCOUNTER — Encounter: Payer: Self-pay | Admitting: Internal Medicine

## 2022-10-13 VITALS — BP 110/76 | HR 86 | Temp 98.0°F | Ht 69.0 in | Wt 184.0 lb

## 2022-10-13 DIAGNOSIS — E785 Hyperlipidemia, unspecified: Secondary | ICD-10-CM

## 2022-10-13 DIAGNOSIS — E039 Hypothyroidism, unspecified: Secondary | ICD-10-CM

## 2022-10-13 DIAGNOSIS — I10 Essential (primary) hypertension: Secondary | ICD-10-CM

## 2022-10-13 DIAGNOSIS — E1169 Type 2 diabetes mellitus with other specified complication: Secondary | ICD-10-CM

## 2022-10-13 NOTE — Patient Instructions (Addendum)
Januvia refilled by written prescription for one year. Change levothyroxine to 75 mcg daily and follow uo with trough TSH on June 13th.

## 2022-10-24 ENCOUNTER — Telehealth: Payer: Self-pay

## 2022-10-24 ENCOUNTER — Other Ambulatory Visit: Payer: Self-pay

## 2022-10-24 MED ORDER — LEVOTHYROXINE SODIUM 88 MCG PO TABS: 88.0000 ug | ORAL_TABLET | Freq: Every day | ORAL | 1 refills | Status: DC

## 2022-10-24 NOTE — Telephone Encounter (Signed)
Patient was using the 75 mcg and it doesn't seem to be working. He would like to increase it to the 88 mcg discussed during visit. [facility]

## 2022-10-24 NOTE — Telephone Encounter (Signed)
Per Dr Lenord Fellers sent to preferred pharmacy.

## 2022-11-24 ENCOUNTER — Other Ambulatory Visit: Payer: Medicare HMO

## 2022-11-24 DIAGNOSIS — E039 Hypothyroidism, unspecified: Secondary | ICD-10-CM | POA: Diagnosis not present

## 2022-11-25 LAB — TSH: TSH: 0.57 mIU/L (ref 0.40–4.50)

## 2022-12-27 ENCOUNTER — Other Ambulatory Visit: Payer: Self-pay | Admitting: Internal Medicine

## 2022-12-29 ENCOUNTER — Encounter: Payer: Self-pay | Admitting: Internal Medicine

## 2022-12-29 DIAGNOSIS — H5213 Myopia, bilateral: Secondary | ICD-10-CM | POA: Diagnosis not present

## 2022-12-29 DIAGNOSIS — Z0100 Encounter for examination of eyes and vision without abnormal findings: Secondary | ICD-10-CM | POA: Diagnosis not present

## 2022-12-29 DIAGNOSIS — E119 Type 2 diabetes mellitus without complications: Secondary | ICD-10-CM | POA: Diagnosis not present

## 2022-12-29 LAB — HM DIABETES EYE EXAM

## 2023-01-04 ENCOUNTER — Telehealth: Payer: Medicare HMO | Admitting: Internal Medicine

## 2023-01-04 ENCOUNTER — Encounter: Payer: Self-pay | Admitting: Internal Medicine

## 2023-01-04 ENCOUNTER — Telehealth: Payer: Self-pay | Admitting: Internal Medicine

## 2023-01-04 DIAGNOSIS — Z951 Presence of aortocoronary bypass graft: Secondary | ICD-10-CM

## 2023-01-04 DIAGNOSIS — E785 Hyperlipidemia, unspecified: Secondary | ICD-10-CM | POA: Diagnosis not present

## 2023-01-04 DIAGNOSIS — E119 Type 2 diabetes mellitus without complications: Secondary | ICD-10-CM | POA: Diagnosis not present

## 2023-01-04 DIAGNOSIS — E1169 Type 2 diabetes mellitus with other specified complication: Secondary | ICD-10-CM

## 2023-01-04 DIAGNOSIS — I10 Essential (primary) hypertension: Secondary | ICD-10-CM

## 2023-01-04 DIAGNOSIS — E039 Hypothyroidism, unspecified: Secondary | ICD-10-CM | POA: Diagnosis not present

## 2023-01-04 DIAGNOSIS — U071 COVID-19: Secondary | ICD-10-CM | POA: Diagnosis not present

## 2023-01-04 DIAGNOSIS — I7121 Aneurysm of the ascending aorta, without rupture: Secondary | ICD-10-CM

## 2023-01-04 MED ORDER — NIRMATRELVIR/RITONAVIR (PAXLOVID)TABLET: 3.0000 | ORAL_TABLET | Freq: Two times a day (BID) | ORAL | 0 refills | Status: AC

## 2023-01-04 NOTE — Telephone Encounter (Signed)
Thomas Washington [phone]  Thomas Washington called to say Thomas Washington has tested positive for COVID. Have set them up for Video visits

## 2023-01-04 NOTE — Patient Instructions (Addendum)
We are sorry to hear that you have contracted COVID-19.  You qualify for regular strength Paxlovid with estimated GFR of 61 cc/min on c-Met in March.  Please rest and stay well-hydrated.  Walk some to prevent atelectasis.  Recommend quarantining approximately 5 days.  Prescription has been sent to pharmacy for Paxlovid.  Monitor pulse ox a couple of times a day.  May take Tylenol if needed for fever and monitor Accu-Cheks.  Please call if you have any questions or concerns.

## 2023-01-04 NOTE — Progress Notes (Addendum)
   Subjective:    Patient ID: Thomas Washington, male    DOB: 04-11-1951, 72 y.o.   MRN: [id number]  HPI I connected with Thomas Washington via interactive audio and video telecommunications today.  He is a longstanding patient in this practice.  He is at his home and I am at my office.  He is agreeable to visit in this format today.  Patient and his wife, who is a Development worker, community, were  visited recently by their  daughter, son-in-law and grandchildren who had recently traveled from [place], [place] to [place].  Daughter,son-in-law and grandchildren had a long delay in [place] airport  due to [employer] computer issues with the airlines. The family subsequently came down with COVID-19; and, now  Thomas Washington and his wife have tested positive for Covid 19 as of today.    Thomas Washington is  interested in receiving Paxlovid treatment.  Patient has a history of coronary artery disease and is status post CABG in June 2020.  History of hyperlipidemia, type 2 diabetes mellitus, BPH, essential hypertension, metabolic syndrome and obesity.    Review of Systems patient has malaise, fatigue, congestion,  cough, but no nausea or vomiting.  No shaking chills.  Does have  sore throat.     Objective:   Physical Exam  He is seen virtually today and appears to be pale and fatigued.  He appears to have no acute respiratory distress and does not appear to be tachypneic when speaking during the visit.      Assessment & Plan:   Acute COVID-19 virus infection  Type 2 diabetes mellitus which is well-controlled  History of coronary artery disease status post four-vessel CABG  History of thoracic aortic aneurysm noted on CTA in 2020.  This was recently evaluated by CVTS and was thought to be stable at 4.3 cm  Essential hypertension-stable  Hyperlipidemia-stable  Hypothyroidism-treated with thyroid replacement medication  Plan: Have reviewed C-met from March and estimated GFR was 61cc/min.  He qualifies for Regular  strength Paxlovid and an order has been sent to pharmacy.  Advised patient to walk some to prevent atelectasis.  Suggest checking  pulse oximetry.  May take Tylenol for fever if needed.  I generally recommend quarantining for 5 days but recent recommendations may have been shortened for quarantining.

## 2023-02-17 NOTE — Progress Notes (Addendum)
Patient Care Team: Margaree Mackintosh, MD as PCP - General (Internal Medicine) Nahser, Deloris Ping, MD as PCP - Cardiology (Cardiology)  Visit Date: 02/24/23  Subjective:    Patient ID: Thomas Washington , Male   DOB: 1950-11-21, 72 y.o.    MRN: [id number]   72 y.o. Male presents today for a 6 month follow-up visit. History of anginal pain, coronary artery disease, Type 2 diabetes mellitus, kidney stones, hypercholesterolemia, MRSA infection. He had Covid-19 in July. Took Paxlovid and did well.  History of Type 2 diabetes mellitus treated with metformin 500 mg morning and 750 mg evening, Januvia 100 mg daily. HGBA1c at 6.5% on 02/23/23, no change from 10/11/22.   History of hyperlipidemia treated with rosuvastatin 40 mg daily.  History of hypothyroidism treated with levothyroxine 88 mcg daily. TSH at 0.13 on 02/23/23, down from 0.57 three months ago. He is feeling better after starting on replacement hormone.  History of Vitamin D deficiency treated with Vitamin D3 10,000 units daily at bedtime.  Colonoscopy last completed 03/16/22. One 2 mm polyp in cecum, removed, one 2 mm polyp in rectum, removed, diverticulosis in sigmoid colon, internal hemorrhoids. Pathology showed one tubular adenoma, hyperplastic polyp. Examination otherwise normal. Recommended repeat in 2028.  Past Medical History:  Diagnosis Date   Anginal pain (HCC)    Coronary artery disease    Diabetes mellitus without complication (HCC)    History of kidney stones    Hypercholesteremia    MRSA (methicillin resistant staph aureus) culture positive      Family History  Problem Relation Age of Onset   Stroke Mother    Heart attack Father    Healthy Sister    Healthy Daughter    Colon cancer Neg Hx    Colon polyps Neg Hx    Esophageal cancer Neg Hx    Rectal cancer Neg Hx    Stomach cancer Neg Hx     Social History   Social History Narrative   Social history: Married with 1 adult daughter.  One grandson.  Another  grandchild on the way.  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  Native of [place].  Wife is a retired family Conservation officer, nature.       Family history: Mother with history of stroke died with complications of dementia and stroke.  Father died at age 72 suddenly presumably of a sudden MI.  1 sister in good health.          Review of Systems  Constitutional:  Negative for fever and malaise/fatigue.  HENT:  Negative for congestion.   Eyes:  Negative for blurred vision.  Respiratory:  Negative for cough and shortness of breath.   Cardiovascular:  Negative for chest pain, palpitations and leg swelling.  Gastrointestinal:  Negative for vomiting.  Musculoskeletal:  Negative for back pain.  Skin:  Negative for rash.  Neurological:  Negative for loss of consciousness and headaches.        Objective:   Vitals: BP 102/70   Pulse 70   Ht 5\' 9"  (1.753 m)   Wt 186 lb (84.4 kg)   SpO2 98%   BMI 27.47 kg/m    Physical Exam Vitals and nursing note reviewed.  Constitutional:      General: He is not in acute distress.    Appearance: Normal appearance. He is not ill-appearing.  HENT:     Head: Normocephalic and atraumatic.  Neck:     Thyroid:  No thyroid mass, thyromegaly or thyroid tenderness.  Pulmonary:     Effort: Pulmonary effort is normal.  Skin:    General: Skin is warm and dry.  Neurological:     Mental Status: He is alert and oriented to person, place, and time. Mental status is at baseline.  Psychiatric:        Mood and Affect: Mood normal.        Behavior: Behavior normal.        Thought Content: Thought content normal.        Judgment: Judgment normal.       Results:   Studies obtained and personally reviewed by me:  Colonoscopy last completed 03/16/22. One 2 mm polyp in cecum, removed, one 2 mm polyp in rectum, removed, diverticulosis in sigmoid colon, internal hemorrhoids. Pathology showed one tubular adenoma, hyperplastic  polyp. Examination otherwise normal. Recommended repeat in 2028.  Labs:       Component Value Date/Time   NA 139 09/01/2022 0957   NA 142 11/09/2018 1329   K 4.7 09/01/2022 0957   CL 102 09/01/2022 0957   CO2 27 09/01/2022 0957   GLUCOSE 164 (H) 09/01/2022 0957   BUN 14 09/01/2022 0957   BUN 9 11/09/2018 1329   CREATININE 1.11 09/01/2022 0957   CALCIUM 9.9 09/01/2022 0957   PROT 7.2 08/26/2022 0920   PROT 7.4 01/07/2019 0827   ALBUMIN 4.5 01/07/2019 0827   AST 25 08/26/2022 0920   ALT 21 08/26/2022 0920   ALKPHOS 56 01/07/2019 0827   BILITOT 0.4 08/26/2022 0920   BILITOT 0.3 01/07/2019 0827   GFRNONAA 71 08/24/2020 1202   GFRAA 83 08/24/2020 1202     Lab Results  Component Value Date   WBC 8.0 08/26/2022   HGB 15.1 08/26/2022   HCT 45.2 08/26/2022   MCV 86.9 08/26/2022   PLT 232 08/26/2022    Lab Results  Component Value Date   CHOL 131 08/26/2022   HDL 61 08/26/2022   LDLCALC 53 08/26/2022   TRIG 87 08/26/2022   CHOLHDL 2.1 08/26/2022    Lab Results  Component Value Date   HGBA1C 6.5 (H) 02/23/2023     Lab Results  Component Value Date   TSH 0.13 (L) 02/23/2023     Lab Results  Component Value Date   PSA 2.46 09/01/2022   PSA 2.21 02/25/2022   PSA 2.31 08/24/2021      Assessment & Plan:   Type 2 diabetes mellitus: treated with metformin 500 mg morning and 750 mg evening, Januvia 100 mg daily. HGBA1c at 6.5% on 02/23/23, no change from 10/11/22.   Hyperlipidemia: treated with rosuvastatin 40 mg daily.  Hypothyroidism: treated with levothyroxine 88 mcg daily. TSH at 0.13 on 02/23/23, down from 0.57 three months ago. He is feeling better after starting on replacement hormone. I would like to keep it around 1.00. Wife who is a physician has read that Biotin can affect TSH results. I was going to have him go back to lower dose 50 mcg daily and take extra dose one day a week but after communication with wife we have agreed to leave the dose alone for now  and follow up in 6 weeks.  Vitamin D deficiency: treated with Vitamin D3 10,000 units daily at bedtime.  Colonoscopy last completed 03/16/22. One 2 mm polyp in cecum, removed, one 2 mm polyp in rectum, removed, diverticulosis in sigmoid colon, internal hemorrhoids. Pathology showed one tubular adenoma, hyperplastic polyp. Examination otherwise normal. Recommended repeat  in 2028.  Vaccine counseling: He will go to the pharmacy for the high-dose flu vaccine and later for the Covid-19 vaccine.  Return in 6 weeks for TSH only.   I,Alexander Ruley,acting as a Neurosurgeon for Margaree Mackintosh, MD.,have documented all relevant documentation on the behalf of Margaree Mackintosh, MD,as directed by  Margaree Mackintosh, MD while in the presence of Margaree Mackintosh, MD.  I, Margaree Mackintosh, MD, have reviewed all documentation for this visit. The documentation on 02/24/23 for the exam, diagnosis, procedures, and orders are all accurate and complete.

## 2023-02-23 ENCOUNTER — Other Ambulatory Visit: Payer: Medicare HMO

## 2023-02-23 DIAGNOSIS — E785 Hyperlipidemia, unspecified: Secondary | ICD-10-CM | POA: Diagnosis not present

## 2023-02-23 DIAGNOSIS — E1169 Type 2 diabetes mellitus with other specified complication: Secondary | ICD-10-CM

## 2023-02-23 DIAGNOSIS — E039 Hypothyroidism, unspecified: Secondary | ICD-10-CM | POA: Diagnosis not present

## 2023-02-24 ENCOUNTER — Encounter: Payer: Self-pay | Admitting: Internal Medicine

## 2023-02-24 ENCOUNTER — Ambulatory Visit (INDEPENDENT_AMBULATORY_CARE_PROVIDER_SITE_OTHER): Payer: Medicare HMO | Admitting: Internal Medicine

## 2023-02-24 VITALS — BP 102/70 | HR 70 | Ht 69.0 in | Wt 186.0 lb

## 2023-02-24 DIAGNOSIS — R7989 Other specified abnormal findings of blood chemistry: Secondary | ICD-10-CM

## 2023-02-24 DIAGNOSIS — E119 Type 2 diabetes mellitus without complications: Secondary | ICD-10-CM | POA: Diagnosis not present

## 2023-02-24 DIAGNOSIS — E039 Hypothyroidism, unspecified: Secondary | ICD-10-CM | POA: Diagnosis not present

## 2023-02-24 LAB — TSH: TSH: 0.13 m[IU]/L — ABNORMAL LOW (ref 0.40–4.50)

## 2023-02-24 LAB — HEMOGLOBIN A1C
Hgb A1c MFr Bld: 6.5 %{Hb} — ABNORMAL HIGH (ref <5.7)
Mean Plasma Glucose: 140 mg/dL
eAG (mmol/L): 7.7 mmol/L

## 2023-02-24 NOTE — Patient Instructions (Addendum)
Wife has read that Biotin can affect TSH levels.  TSH is  low on 88 mcg daily.  He will return in 6 weeks for TSH only without office visit.We decided not to change dose of thyroid replacement at this time.

## 2023-04-07 ENCOUNTER — Other Ambulatory Visit: Payer: Medicare HMO

## 2023-04-07 DIAGNOSIS — E039 Hypothyroidism, unspecified: Secondary | ICD-10-CM | POA: Diagnosis not present

## 2023-04-08 LAB — TSH: TSH: 1.71 m[IU]/L (ref 0.40–4.50)

## 2023-04-10 ENCOUNTER — Other Ambulatory Visit: Payer: Self-pay | Admitting: Internal Medicine

## 2023-05-13 ENCOUNTER — Other Ambulatory Visit: Payer: Self-pay | Admitting: Internal Medicine

## 2023-05-15 ENCOUNTER — Other Ambulatory Visit: Payer: Self-pay

## 2023-05-15 MED ORDER — METFORMIN HCL 500 MG PO TABS: 500.0000 mg | ORAL_TABLET | Freq: Two times a day (BID) | ORAL | 3 refills | Status: DC

## 2023-05-15 NOTE — Telephone Encounter (Signed)
  Copied from CRM [id number]. Topic: Clinical - Prescription Issue >> May 15, 2023 12:34 PM Amy B wrote: Reason for CRM: Patient states pharmacy informed him refill for Metformin was denied by physician.  He would like a call back to discuss, [phone].

## 2023-07-14 ENCOUNTER — Telehealth: Payer: Self-pay

## 2023-07-14 NOTE — Telephone Encounter (Signed)
[facility] sent a fax they need permission to switch levothyroxine to another manufacturer.

## 2023-07-20 NOTE — Telephone Encounter (Signed)
 Patient called the office and says it is ok with him to change brands of Levothyroxine .  I have let pharmacy know this today. MJB, MD

## 2023-08-15 ENCOUNTER — Other Ambulatory Visit: Payer: Medicare HMO

## 2023-08-28 ENCOUNTER — Other Ambulatory Visit: Payer: Medicare HMO

## 2023-08-28 DIAGNOSIS — Z0000 Encounter for general adult medical examination without abnormal findings: Secondary | ICD-10-CM

## 2023-08-28 NOTE — Addendum Note (Signed)
 Addended by: Larey Dresser on: 08/28/2023 10:41 AM   Modules accepted: Orders

## 2023-08-29 LAB — COMPREHENSIVE METABOLIC PANEL
AG Ratio: 1.6 (calc) (ref 1.0–2.5)
ALT: 20 U/L (ref 9–46)
AST: 20 U/L (ref 10–35)
Albumin: 4.5 g/dL (ref 3.6–5.1)
Alkaline phosphatase (APISO): 49 U/L (ref 35–144)
BUN: 16 mg/dL (ref 7–25)
CO2: 26 mmol/L (ref 20–32)
Calcium: 9.5 mg/dL (ref 8.6–10.3)
Chloride: 102 mmol/L (ref 98–110)
Creat: 1.09 mg/dL (ref 0.70–1.28)
Globulin: 2.8 g/dL (ref 1.9–3.7)
Glucose, Bld: 103 mg/dL — ABNORMAL HIGH (ref 65–99)
Potassium: 4.6 mmol/L (ref 3.5–5.3)
Sodium: 138 mmol/L (ref 135–146)
Total Bilirubin: 0.6 mg/dL (ref 0.2–1.2)
Total Protein: 7.3 g/dL (ref 6.1–8.1)

## 2023-08-29 LAB — CBC WITH DIFFERENTIAL/PLATELET
Absolute Lymphocytes: 2613 {cells}/uL (ref 850–3900)
Absolute Monocytes: 764 {cells}/uL (ref 200–950)
Basophils Absolute: 78 {cells}/uL (ref 0–200)
Basophils Relative: 1 %
Eosinophils Absolute: 187 {cells}/uL (ref 15–500)
Eosinophils Relative: 2.4 %
HCT: 48.6 % (ref 38.5–50.0)
Hemoglobin: 15.6 g/dL (ref 13.2–17.1)
MCH: 28.8 pg (ref 27.0–33.0)
MCHC: 32.1 g/dL (ref 32.0–36.0)
MCV: 89.8 fL (ref 80.0–100.0)
MPV: 10.2 fL (ref 7.5–12.5)
Monocytes Relative: 9.8 %
Neutro Abs: 4157 {cells}/uL (ref 1500–7800)
Neutrophils Relative %: 53.3 %
Platelets: 231 10*3/uL (ref 140–400)
RBC: 5.41 10*6/uL (ref 4.20–5.80)
RDW: 13.7 % (ref 11.0–15.0)
Total Lymphocyte: 33.5 %
WBC: 7.8 10*3/uL (ref 3.8–10.8)

## 2023-08-29 LAB — LIPID PANEL
Cholesterol: 134 mg/dL (ref <200)
HDL: 58 mg/dL (ref >=40)
LDL Cholesterol (Calc): 57 mg/dL
Non-HDL Cholesterol (Calc): 76 mg/dL (ref <130)
Total CHOL/HDL Ratio: 2.3 (calc) (ref <5.0)
Triglycerides: 105 mg/dL (ref <150)

## 2023-08-29 LAB — HEMOGLOBIN A1C
Hgb A1c MFr Bld: 6.7 %{Hb} — ABNORMAL HIGH (ref <5.7)
Mean Plasma Glucose: 146 mg/dL
eAG (mmol/L): 8.1 mmol/L

## 2023-08-29 LAB — HEPATIC FUNCTION PANEL
AG Ratio: 1.6 (calc) (ref 1.0–2.5)
ALT: 20 U/L (ref 9–46)
AST: 20 U/L (ref 10–35)
Albumin: 4.5 g/dL (ref 3.6–5.1)
Alkaline phosphatase (APISO): 49 U/L (ref 35–144)
Bilirubin, Direct: 0.1 mg/dL (ref 0.0–0.2)
Globulin: 2.8 g/dL (ref 1.9–3.7)
Indirect Bilirubin: 0.5 mg/dL (ref 0.2–1.2)
Total Bilirubin: 0.6 mg/dL (ref 0.2–1.2)
Total Protein: 7.3 g/dL (ref 6.1–8.1)

## 2023-08-29 LAB — PSA: PSA: 2.7 ng/mL (ref <=4.00)

## 2023-08-30 NOTE — Progress Notes (Signed)
 Annual Wellness Visit   Patient Care Team: Khushi Zupko, Luanna Cole, MD as PCP - General (Internal Medicine) Nahser, Deloris Ping, MD as PCP - Cardiology (Cardiology)  Visit Date: 09/01/23   Chief Complaint  Patient presents with   Medicare Wellness   Annual Exam   Subjective:  Patient: Thomas Washington, Male DOB: 1950/09/13, 73 y.o. MRN: [id number]  YOGI ARTHER is a 73 y.o. Male who presents today for his Annual Wellness Visit. Patient has history of Coronary Artery Disease s/p CABG June 2020, Hyperlipidemia, BPH, Essential Hypertension, Metabolic Syndrome, Anginal Pain, Coronary Artery Disease, Type 2 Diabetes Mellitus, Kidney Stones, Hypercholesterolemia.    S/p CABG x4 in June 2021 by Dr. Tyrone Sage. He was also found to have a dilated thoracic ascending aorta 4.5 cm in diameter which is being watched.  History of Bilateral Sensorineural Hearing Loss seen by Dr. Jenne Pane, ENT physician and is being monitored. Hearing aids have been discussed in the past, but today does not mention having any issues.  History of Diabetes Mellitus, type II treated with Metformin 1000 daily. 08/28/2023 Blood Glucose 103, decreased from 164; HgbA1c, compared to 08/2022: 6.7, no change. He says that he would like to start taking 3 500 mg tablets daily for a total of 1500 mg and resume Januvia 100 mg daily for improvement diabetic management. Annual diabetic eye exam on 12/29/2022 at [facility] w/o any diabetic retinopathy.   History of Hypercholesterolemia treated with Rosuvastatin 40 mg daily. 08/28/2023 Lipid Panel: WNL.  History of Vitamin-D Deficiency treated with Vitamin D3 10,000 units daily at bedtime.   History of Hypothyroidism treated with Levothyroxine 88 mcg daily.TSH: 1.71 in 03/2023, elevated from 0.13 on 02/23/23.   Labs 08/28/2023 CBC: WNL CMP, compared to 08/2022: Blood Glucose 103, decreased from 164.   Colonoscopy 03/16/2022 with 2 Polyps removed - one 2 mm from the cecum and one 2 mm  polyp from the rectum; Diverticulosis in sigmoid colon; Internal hemorrhoids; otherwise normal. Recommended repeat in 2028.   PSA  2.70  08/28/2023  History of BPH treated with Flomax daily. Says he doesn't have much issues, wakes up once per night to urinate.   Vaccine Counseling: Due for Flu and Covid-19 - postponed per patient preference; UTD on Shingles 2/2, PNA, and Tdap. Past Medical History:  Diagnosis Date   Anginal pain (HCC)    Coronary artery disease    Diabetes mellitus without complication (HCC)    History of kidney stones    Hypercholesteremia    MRSA (methicillin resistant staph aureus) culture positive   Medical/Surgical History Narrative:  No known drug allergies.  2014 - Acute Pericarditis, seen by Dr. Elease Hashimoto.  1991 - Candida Esophagitis. Schatzki's Ring. Family History  Problem Relation Age of Onset   Stroke Mother    Heart attack Father    Healthy Sister    Healthy Daughter    Colon cancer Neg Hx    Colon polyps Neg Hx    Esophageal cancer Neg Hx    Rectal cancer Neg Hx    Stomach cancer Neg Hx    Social History   Social History Narrative   Social history: Married with 1 adult daughter.  One grandson.  Another grandchild on the way.  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  Native of [place].  Wife is a retired family Conservation officer, nature.       Family history: Mother with history of stroke died with complications of dementia and stroke.  Father died at age 62 suddenly presumably of a sudden MI.  1 sister in good health.       Review of Systems  Constitutional:  Negative for chills, fever, malaise/fatigue and weight loss.  HENT:  Negative for hearing loss, sinus pain and sore throat.   Respiratory:  Negative for cough, hemoptysis and shortness of breath.   Cardiovascular:  Negative for chest pain, palpitations, leg swelling and PND.  Gastrointestinal:  Negative for abdominal pain, constipation, diarrhea, heartburn,  nausea and vomiting.  Genitourinary:  Negative for dysuria, frequency and urgency.  Musculoskeletal:  Negative for back pain, myalgias and neck pain.  Skin:  Negative for itching and rash.  Neurological:  Negative for dizziness, tingling, seizures and headaches.  Endo/Heme/Allergies:  Negative for polydipsia.  Psychiatric/Behavioral:  Negative for depression. The patient is not nervous/anxious.     Objective:  Vitals: BP 110/68   Pulse 70   Temp 98.1 F (36.7 C)   Ht 5\' 8"  (1.727 m)   Wt 186 lb (84.4 kg)   SpO2 97%   BMI 28.28 kg/m  Physical Exam Vitals and nursing note reviewed.  Constitutional:      General: He is awake. He is not in acute distress.    Appearance: Normal appearance. He is not ill-appearing or toxic-appearing.  HENT:     Head: Normocephalic and atraumatic.     Right Ear: Tympanic membrane, ear canal and external ear normal.     Left Ear: Tympanic membrane, ear canal and external ear normal.     Mouth/Throat:     Pharynx: Oropharynx is clear.  Eyes:     Extraocular Movements: Extraocular movements intact.     Pupils: Pupils are equal, round, and reactive to light.  Neck:     Thyroid: No thyroid mass, thyromegaly or thyroid tenderness.     Vascular: No carotid bruit.  Cardiovascular:     Rate and Rhythm: Normal rate and regular rhythm. No extrasystoles are present.    Pulses:          Dorsalis pedis pulses are 1+ on the right side and 1+ on the left side.     Heart sounds: Normal heart sounds. No murmur heard.    No friction rub. No gallop.  Pulmonary:     Effort: Pulmonary effort is normal.     Breath sounds: Normal breath sounds. No decreased breath sounds, wheezing, rhonchi or rales.  Chest:     Chest wall: No mass.  Abdominal:     Palpations: Abdomen is soft. There is no hepatomegaly, splenomegaly or mass.     Tenderness: There is no abdominal tenderness.     Hernia: No hernia is present.  Genitourinary:    Prostate: Enlarged (slightly enlarged,  symmetrical).     Rectum: Guaiac result negative.  Musculoskeletal:     Cervical back: Normal range of motion.     Right lower leg: No edema.     Left lower leg: No edema.  Lymphadenopathy:     Cervical: No cervical adenopathy.     Upper Body:     Right upper body: No supraclavicular adenopathy.     Left upper body: No supraclavicular adenopathy.  Skin:    General: Skin is warm and dry.  Neurological:     General: No focal deficit present.     Mental Status: He is alert and oriented to person, place, and time. Mental status is at baseline.     Cranial Nerves: Cranial nerves 2-12 are intact.  Sensory: Sensation is intact.     Motor: Motor function is intact.     Coordination: Coordination is intact.     Gait: Gait is intact.     Deep Tendon Reflexes: Reflexes are normal and symmetric.  Psychiatric:        Attention and Perception: Attention normal.        Mood and Affect: Mood normal.        Speech: Speech normal.        Behavior: Behavior normal. Behavior is cooperative.        Thought Content: Thought content normal.        Cognition and Memory: Cognition and memory normal.        Judgment: Judgment normal.   Most Recent Fall Risk Assessment:    10/13/2022    3:41 PM  Fall Risk   Falls in the past year? 0  Number falls in past yr: 0  Injury with Fall? 0  Risk for fall due to : No Fall Risks  Follow up Falls prevention discussed   Most Recent Depression Screenings:    10/13/2022    3:51 PM 08/30/2022    2:58 PM  PHQ 2/9 Scores  PHQ - 2 Score 0 0   Most Recent Cognitive Screening:    08/30/2022    3:00 PM  6CIT Screen  What Year? 0 points  What month? 0 points  What time? 0 points  Count back from 20 0 points  Months in reverse 0 points  Repeat phrase 0 points  Total Score 0 points   Results:  Studies Obtained And Personally Reviewed By Me:  Colonoscopy 03/16/2022 with 2 Polyps removed - one 2 mm from the cecum and one 2 mm polyp from the rectum;  Diverticulosis in sigmoid colon; Internal hemorrhoids; otherwise normal.  Labs:     Component Value Date/Time   NA 138 08/28/2023 1244   NA 142 11/09/2018 1329   K 4.6 08/28/2023 1244   CL 102 08/28/2023 1244   CO2 26 08/28/2023 1244   GLUCOSE 103 (H) 08/28/2023 1244   BUN 16 08/28/2023 1244   BUN 9 11/09/2018 1329   CREATININE 1.09 08/28/2023 1244   CALCIUM 9.5 08/28/2023 1244   PROT 7.3 08/28/2023 1244   PROT 7.3 08/28/2023 1244   PROT 7.4 01/07/2019 0827   ALBUMIN 4.5 01/07/2019 0827   AST 20 08/28/2023 1244   AST 20 08/28/2023 1244   ALT 20 08/28/2023 1244   ALT 20 08/28/2023 1244   ALKPHOS 56 01/07/2019 0827   BILITOT 0.6 08/28/2023 1244   BILITOT 0.6 08/28/2023 1244   BILITOT 0.3 01/07/2019 0827   GFRNONAA 71 08/24/2020 1202   GFRAA 83 08/24/2020 1202    Lab Results  Component Value Date   WBC 7.8 08/28/2023   HGB 15.6 08/28/2023   HCT 48.6 08/28/2023   MCV 89.8 08/28/2023   PLT 231 08/28/2023   Lab Results  Component Value Date   CHOL 134 08/28/2023   HDL 58 08/28/2023   LDLCALC 57 08/28/2023   TRIG 105 08/28/2023   CHOLHDL 2.3 08/28/2023   Lab Results  Component Value Date   HGBA1C 6.7 (H) 08/28/2023    Lab Results  Component Value Date   TSH 1.71 04/07/2023    Lab Results  Component Value Date   PSA 2.70 08/28/2023   PSA 2.46 09/01/2022   PSA 2.21 02/25/2022     Assessment & Plan:   Orders Placed This Encounter  Procedures  Urine Microalbumin w/creat. ratio  Other Labs Reviewed today: CBC: WNL CMP, compared to 08/2022: Blood Glucose 103, decreased from 164.   S/p CABG x4 in June 2021 by Dr. Tyrone Sage. He was also found to have a dilated thoracic ascending aorta 4.5 cm in diameter which is being watched.  Bilateral Sensorineural Hearing Loss seen by Dr. Jenne Pane, ENT physician and is being monitored. Hearing aids have been discussed in the past, but today does not mention having any issues.  Diabetes Mellitus, type II treated with Metformin  1000 daily. 08/28/2023 Blood Glucose 103, decreased from 164; HgbA1c, compared to 08/2022: 6.7, no change. He says that he would like to start taking 3 500 mg tablets daily for a total of 1500 mg and resume Januvia 100 mg daily for improvement diabetic management. Annual diabetic eye exam on 12/29/2022 at [facility] w/o any diabetic retinopathy. Urine Microalbumin/Creat Ratio ordered today for diabetic kidney evaluation.   Hypercholesterolemia treated with Rosuvastatin 40 mg daily. 08/28/2023 Lipid Panel: WNL.  Vitamin-D Deficiency treated with Vitamin D3 10,000 units daily at bedtime.  Aneurysm of ascending aorta- followed by annual imaging. Ordering repeat exam   Hypothyroidism treated with Levothyroxine 88 mcg daily.TSH: 1.71 in 03/2023, elevated from 0.13 on 02/23/23.   Colonoscopy 03/16/2022 with 2 Polyps removed - one 2 mm from the cecum and one 2 mm polyp from the rectum; Diverticulosis in sigmoid colon; Internal hemorrhoids; otherwise normal. Recommended repeat in 2028.  PSA  2.70  08/28/2023  BPH treated with Flomax daily. Says he doesn't have much issues, wakes up once per night to urinate.   Vaccine Counseling: Due for Flu and Covid-19 - postponed per patient preference; UTD on Shingles 2/2, PNA, and Tdap.  Upcoming Travel: planning on going on a cruise. Requesting Scopolamine and Zofran    Annual wellness visit done today including the all of the following: Reviewed patient's Family Medical History Reviewed and updated list of patient's medical providers Assessment of cognitive impairment was done Assessed patient's functional ability Established a written schedule for health screening services Health Risk Assessent Completed and Reviewed  Discussed health benefits of physical activity, and encouraged him to engage in regular exercise appropriate for his age and condition.    I,Emily Lagle,acting as a Neurosurgeon for Margaree Mackintosh, MD.,have documented all relevant  documentation on the behalf of Margaree Mackintosh, MD,as directed by  Margaree Mackintosh, MD while in the presence of Margaree Mackintosh, MD.   I, Margaree Mackintosh, MD, have reviewed all documentation for this visit. The documentation on 09/09/23 for the exam, diagnosis, procedures, and orders are all accurate and complete.

## 2023-08-31 ENCOUNTER — Other Ambulatory Visit: Payer: Medicare HMO

## 2023-09-01 ENCOUNTER — Encounter: Payer: Self-pay | Admitting: Internal Medicine

## 2023-09-01 ENCOUNTER — Ambulatory Visit (INDEPENDENT_AMBULATORY_CARE_PROVIDER_SITE_OTHER): Payer: Medicare HMO | Admitting: Internal Medicine

## 2023-09-01 VITALS — BP 110/68 | HR 70 | Temp 98.1°F | Ht 68.0 in | Wt 186.0 lb

## 2023-09-01 DIAGNOSIS — E785 Hyperlipidemia, unspecified: Secondary | ICD-10-CM

## 2023-09-01 DIAGNOSIS — I7121 Aneurysm of the ascending aorta, without rupture: Secondary | ICD-10-CM | POA: Diagnosis not present

## 2023-09-01 DIAGNOSIS — E119 Type 2 diabetes mellitus without complications: Secondary | ICD-10-CM

## 2023-09-01 DIAGNOSIS — I10 Essential (primary) hypertension: Secondary | ICD-10-CM | POA: Diagnosis not present

## 2023-09-01 DIAGNOSIS — I2510 Atherosclerotic heart disease of native coronary artery without angina pectoris: Secondary | ICD-10-CM | POA: Diagnosis not present

## 2023-09-01 DIAGNOSIS — Z951 Presence of aortocoronary bypass graft: Secondary | ICD-10-CM

## 2023-09-01 DIAGNOSIS — E782 Mixed hyperlipidemia: Secondary | ICD-10-CM | POA: Diagnosis not present

## 2023-09-01 DIAGNOSIS — E039 Hypothyroidism, unspecified: Secondary | ICD-10-CM | POA: Diagnosis not present

## 2023-09-01 DIAGNOSIS — E1169 Type 2 diabetes mellitus with other specified complication: Secondary | ICD-10-CM | POA: Diagnosis not present

## 2023-09-01 DIAGNOSIS — Z0000 Encounter for general adult medical examination without abnormal findings: Secondary | ICD-10-CM | POA: Diagnosis not present

## 2023-09-01 MED ORDER — ONDANSETRON HCL 4 MG PO TABS: 4.0000 mg | ORAL_TABLET | Freq: Three times a day (TID) | ORAL | 0 refills | Status: DC | PRN

## 2023-09-01 MED ORDER — SCOPOLAMINE 1 MG/3DAYS TD PT72: 1.0000 | MEDICATED_PATCH | TRANSDERMAL | 3 refills | Status: DC

## 2023-09-02 LAB — MICROALBUMIN / CREATININE URINE RATIO
Creatinine, Urine: 77 mg/dL (ref 20–320)
Microalb Creat Ratio: 136 mg/g{creat} — ABNORMAL HIGH (ref <30)
Microalb, Ur: 10.5 mg/dL

## 2023-09-03 ENCOUNTER — Other Ambulatory Visit: Payer: Self-pay | Admitting: Cardiovascular Disease

## 2023-09-03 DIAGNOSIS — I2510 Atherosclerotic heart disease of native coronary artery without angina pectoris: Secondary | ICD-10-CM

## 2023-09-03 DIAGNOSIS — E785 Hyperlipidemia, unspecified: Secondary | ICD-10-CM

## 2023-09-09 NOTE — Patient Instructions (Addendum)
 Labs are stable. Have ordered imaging study for thoracic aneurysm which is done annually. Follow up in 6 months. Continue diet and exercise regimen. Meds refilled as requested.

## 2023-09-11 ENCOUNTER — Other Ambulatory Visit: Payer: Self-pay | Admitting: Internal Medicine

## 2023-09-12 ENCOUNTER — Telehealth: Payer: Self-pay

## 2023-09-12 NOTE — Telephone Encounter (Signed)
 Received response  from insurance Medication has been denied.  Plan does not allow coverage of this medication

## 2023-09-12 NOTE — Telephone Encounter (Signed)
 Called patient let know he will have to pay out of pocket. He has picked up already will call if any questions.

## 2023-09-14 ENCOUNTER — Ambulatory Visit
Admission: RE | Admit: 2023-09-14 | Discharge: 2023-09-14 | Disposition: A | Discharge status: Home / Self Care | Source: Ambulatory Visit | Attending: Internal Medicine | Admitting: Internal Medicine

## 2023-09-14 DIAGNOSIS — I7121 Aneurysm of the ascending aorta, without rupture: Secondary | ICD-10-CM | POA: Diagnosis not present

## 2023-09-14 MED ORDER — IOPAMIDOL (ISOVUE-370) INJECTION 76%
75.0000 mL | Freq: Once | INTRAVENOUS | Status: AC | PRN
  Administered 2023-09-14: 75 mL via INTRAVENOUS

## 2023-09-14 NOTE — Progress Notes (Unsigned)
 [name] Date of Birth  January 29, 1951       [facility] Office [address]  [address] [place], [place]  [address]   [place], [place]  [address] [phone]     [phone]   Fax  [phone]    Fax [phone]  Problem List: 1. Pericarditis 2. Hyperlipidemia 3. Hypertension 4. Diabetes mellitus   Previous Notes.  Rosanne Ashing is a 73 yo who developed CP recently 2 days ago .  Very tender to the touch.  Felt like rib pain.  Thought it was indigestion initially.   Was positional , chest pain eased with sitting forward.  Worse with deep breath.  He drove himself to the ER. D-dimer was normal < 0.27.  Troponin was normal.  He took Motrin and feels much better.    + cough, ( dry)   No fevers, no chills, no  Some dyspnea with walking.  Strong family history of CAD - father died at age 8 of MI.   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  HbA1c is 6.9  He had a stress myoview which was normal. An echocardiogram which revealed normal left ventricular systolic function. He was found to have some diastolic dysfunction.  His back doing all of his normal activities without difficulty.  Sept. 14, 2020:  Rosanne Ashing is seen today for follow-up visit.  He was found to have coronary artery disease.  Is admitted in early June and had coronary artery bypass grafting. Doing great.  No angina .   Still some MSK pain  Exercising well   September 03, 2019:  Rosanne Ashing is seen today for follow-up visit.  He has a history of coronary artery disease and coronary artery bypass grafting in June, 2020. Exercising regularly .  No CP,  No chest wall tenderness.    Planning on going bear hunting in [place] in several months .  Is tolerating the metoprolol better now.   Reviewed lipids - numbers look great.   August 31, 2020:   Rosanne Ashing is seen today for follow up for his CAD, CABG in June 2020   Has not been exercising  Some yard  work ,   Building a free standing tree stand in [place]   September 13, 2021:  Rosanne Ashing is seen today for follow-up of his coronary artery disease, coronary artery bypass grafting in June 2020. No angina   Labs from March, 2023 show a total cholesterol of 142.  HDL is 57.  LDL is 63.  Triglyceride level is 135.  Hemoglobin A1c is 6.3.  Hemoglobin is 15.5.  Creatinine is 1.07.  Walks 3 times a week - 1.5  miles, encouraged him to walk further    October 04, 2022, JIm is seen for follow up of his CAD , CABG in  JUne 2020 No CP , no dyspnea .  Labs from August 26, 2022 include Total cholesterol 131 HDL is 61 LDL is 53 Triglyceride level is 87  He stopped his metoprolol due to orthostasis  Asc. Aortic aneurism is 4.3 CM    September 15, 2023 Rosanne Ashing is seen for follow up of his CAD , CABG  , HLD , Asc. Aortic aneurism   Lipids look great  LDL is 57 .  Trigs = 105     Current Outpatient Medications on File Prior to Visit  Medication Sig Dispense Refill   acetaminophen (  TYLENOL) 500 MG tablet Take 2 tablets (1,000 mg total) by mouth every 6 (six) hours. (Patient taking differently: Take 1,000 mg by mouth as needed.) 30 tablet 0   aspirin EC 81 MG tablet Take 1 tablet (81 mg total) by mouth daily.     B Complex Vitamins (B COMPLEX PO) Take 1 tablet by mouth daily.      BIOTIN PO Take by mouth.     Cholecalciferol (VITAMIN D3) 250 MCG (10000 UT) capsule Take 10,000 Units by mouth at bedtime.     Coenzyme Q10 (CO Q 10 PO) Take by mouth.     folic acid (FOLVITE) 1 MG tablet Take 1 tablet (1 mg total) by mouth daily. 90 tablet 3   JANUVIA 100 MG tablet TAKE 1 TABLET BY MOUTH EVERY DAY 90 tablet 3   levothyroxine (SYNTHROID) 88 MCG tablet Take 1 tablet by mouth once daily 90 tablet 1   metFORMIN (GLUCOPHAGE) 1000 MG tablet Take 0.5-1 tablets (500-1,000 mg total) by mouth See admin instructions. Take 500 mg in the morning and 1000 mg in the evening (Patient taking differently: Take 750 mg by mouth  in the morning and at bedtime. 500 mg in morning and 750 mg in the evening)     rosuvastatin (CRESTOR) 40 MG tablet Take 1 tablet (40 mg total) by mouth daily. 90 tablet 3   tamsulosin (FLOMAX) 0.4 MG CAPS capsule Take 1 capsule (0.4 mg total) by mouth daily. 180 capsule 1   [DISCONTINUED] tamsulosin (FLOMAX) 0.4 MG CAPS capsule Take 0.4 mg by mouth daily after supper.     No current facility-administered medications on file prior to visit.    Allergies  Allergen Reactions   Quinolones Other (See Comments)    Patient was warned about not using Cipro and similar antibiotics. Recent studies have raised concern that fluoroquinolone antibiotics could be associated with an increased risk of aortic aneurysm Fluoroquinolones have non-antimicrobial properties that might jeopardise the integrity of the extracellular matrix of the vascular wall In a  propensity score matched cohort study in [place], there was a 66% increased rate of aortic aneurysm or dissection associated with oral fluoroquinolone use, compared wit   Levaquin [Levofloxacin] Other (See Comments)    Due to heart condition     Past Medical History:  Diagnosis Date   Anginal pain (HCC)    Coronary artery disease    Diabetes mellitus without complication (HCC)    History of kidney stones    Hypercholesteremia    MRSA (methicillin resistant staph aureus) culture positive     Past Surgical History:  Procedure Laterality Date   CARDIAC CATHETERIZATION     COLONOSCOPY  03/29/2017   Dr.Perry   CORONARY ARTERY BYPASS GRAFT N/A 11/21/2018   Procedure: CORONARY ARTERY BYPASS GRAFTING (CABG), ON PUMP, TIMES 4, USING LEFT INTERNAL MAMMARY ARTERY AND RIGHT GREATER SAPHENOUS VEIN, LIMA TO LAD, RIGHT SAPHENOUS VEIN TO OM2, RIGHT SAPHENOUS VEIN TO DISTAL CIRC, RIGHT SAPHENOUS VEIN TO ACUTE MARGINAL;  Surgeon: Delight Ovens, MD;  Location: [facility] OR;  Service: Open Heart Surgery;  Laterality: N/A;   LEFT HEART CATH AND CORONARY ANGIOGRAPHY  N/A 11/14/2018   Procedure: LEFT HEART CATH AND CORONARY ANGIOGRAPHY;  Surgeon: Corky Crafts, MD;  Location: [facility] INVASIVE CV LAB;  Service: Cardiovascular;  Laterality: N/A;   TEE WITHOUT CARDIOVERSION N/A 11/21/2018   Procedure: TRANSESOPHAGEAL ECHOCARDIOGRAM (TEE);  Surgeon: Delight Ovens, MD;  Location: [facility] OR;  Service: Open Heart Surgery;  Laterality: N/A;  VASECTOMY     WISDOM TOOTH EXTRACTION      Social History   Tobacco Use  Smoking Status Never  Smokeless Tobacco Never    Social History   Substance and Sexual Activity  Alcohol Use Yes   Comment: rare    Family History  Problem Relation Age of Onset   Stroke Mother    Heart attack Father    Healthy Sister    Healthy Daughter    Colon cancer Neg Hx    Colon polyps Neg Hx    Esophageal cancer Neg Hx    Rectal cancer Neg Hx    Stomach cancer Neg Hx     Reviw of Systems:  Reviewed in the HPI.  All other systems are negative.   Physical Exam: Blood pressure 126/78, pulse 68, weight 179 lb (81.2 kg), SpO2 95%.       GEN:  Well nourished, well developed in no acute distress HEENT: Normal NECK: No JVD; No carotid bruits LYMPHATICS: No lymphadenopathy CARDIAC: RRR , no murmurs, rubs, gallops RESPIRATORY:  Clear to auscultation without rales, wheezing or rhonchi  ABDOMEN: Soft, non-tender, non-distended MUSCULOSKELETAL:  No edema; No deformity  SKIN: Warm and dry NEUROLOGIC:  Alert and oriented x 3    ECG:     EKG Interpretation Date/Time:  Friday September 15 2023 10:37:00 EDT Ventricular Rate:  67 PR Interval:  152 QRS Duration:  76 QT Interval:  396 QTC Calculation: 418 R Axis:   -5  Text Interpretation: Normal sinus rhythm Normal ECG When compared with ECG of 22-Nov-2018 07:13, No significant change since last tracing Confirmed by Kristeen Miss ([id number]) on 09/15/2023 10:59:57 AM    Assessment / Plan  1.  CAD:   S/p CABG.    denies having any episodes of angina.  His LDL looks  great.   2.  Hyperlipidemia: Continue current statin.  His LDL looks good.  3.  Mildly dilated ascending aorta: His ascending aorta has been stable.  It measures 4.3 cm.  Will get an MR angiogram in 1 year.    Return:     Kristeen Miss, MD  09/15/2023 11:07 AM    [facility] [address] [place],  [address] [place], [place]  [address] Pager [phone] Phone: [phone]; Fax: [phone]

## 2023-09-15 ENCOUNTER — Ambulatory Visit: Payer: Medicare HMO | Attending: Internal Medicine | Admitting: Cardiovascular Disease

## 2023-09-15 ENCOUNTER — Other Ambulatory Visit: Payer: Self-pay | Admitting: *Deleted

## 2023-09-15 ENCOUNTER — Encounter: Payer: Self-pay | Admitting: Cardiovascular Disease

## 2023-09-15 VITALS — BP 126/78 | HR 68 | Wt 179.0 lb

## 2023-09-15 DIAGNOSIS — E785 Hyperlipidemia, unspecified: Secondary | ICD-10-CM | POA: Diagnosis not present

## 2023-09-15 DIAGNOSIS — I2510 Atherosclerotic heart disease of native coronary artery without angina pectoris: Secondary | ICD-10-CM

## 2023-09-15 DIAGNOSIS — E782 Mixed hyperlipidemia: Secondary | ICD-10-CM

## 2023-09-15 MED ORDER — ONETOUCH VERIO VI STRP: ORAL_STRIP | 0 refills | Status: DC

## 2023-09-15 MED ORDER — ROSUVASTATIN CALCIUM 40 MG PO TABS
40.0000 mg | ORAL_TABLET | Freq: Every day | ORAL | 3 refills | Status: AC
Start: 2023-09-15 — End: ?

## 2023-09-15 NOTE — Patient Instructions (Signed)
 Medication Instructions:  REFILLED Rosuvastatin *If you need a refill on your cardiac medications before your next appointment, please call your pharmacy*  Testing/Procedures: MRA of Aorta  Follow-Up: At [facility], you and your health needs are our priority.  As part of our continuing mission to provide you with exceptional heart care, our providers are all part of one team.  This team includes your primary Cardiologist (physician) and Advanced Practice Providers or APPs (Physician Assistants and Nurse Practitioners) who all work together to provide you with the care you need, when you need it.  Your next appointment:   1 year(s)  Provider:   Kristeen Miss, MD        1st Floor: - Lobby - Registration  - Pharmacy  - Lab - Cafe  2nd Floor: - PV Lab - Diagnostic Testing (echo, CT, nuclear med)  3rd Floor: - Vacant  4th Floor: - TCTS (cardiothoracic surgery) - AFib Clinic - Structural Heart Clinic - Vascular Surgery  - Vascular Ultrasound  5th Floor: - HeartCare Cardiology (general and EP) - Clinical Pharmacy for coumadin, hypertension, lipid, weight-loss medications, and med management appointments    Valet parking services will be available as well.

## 2023-09-25 ENCOUNTER — Telehealth: Payer: Self-pay

## 2023-09-25 ENCOUNTER — Other Ambulatory Visit: Payer: Self-pay | Admitting: Surgery

## 2023-09-25 DIAGNOSIS — I7121 Aneurysm of the ascending aorta, without rupture: Secondary | ICD-10-CM

## 2023-09-25 NOTE — Telephone Encounter (Signed)
 Good afternoon can you give me a hand witht his prior auth please?  OneTouch Verio Test Strips. Qty: 300  Thank you

## 2023-09-26 ENCOUNTER — Other Ambulatory Visit (HOSPITAL_COMMUNITY): Payer: Self-pay

## 2023-09-26 NOTE — Telephone Encounter (Signed)
 Called patient, and he told me he got them approved.

## 2023-09-26 NOTE — Telephone Encounter (Signed)
 Good afternoon, I ran a test claim for Mr. Thomas Washington Verio test strips and it was refill too soon, they were last filled 09/15/23 and next fill will be available 11/29/23.    I also called [facility] and pharmacist confirmed that #[phone] was filled on 09/15/23 and pt's copay was $0.00   I hope this helps!

## 2023-10-06 ENCOUNTER — Other Ambulatory Visit (HOSPITAL_COMMUNITY): Payer: Self-pay

## 2023-10-23 ENCOUNTER — Other Ambulatory Visit

## 2023-10-30 ENCOUNTER — Ambulatory Visit

## 2023-12-03 ENCOUNTER — Other Ambulatory Visit: Payer: Self-pay | Admitting: Internal Medicine

## 2023-12-03 DIAGNOSIS — I2510 Atherosclerotic heart disease of native coronary artery without angina pectoris: Secondary | ICD-10-CM

## 2023-12-29 DIAGNOSIS — E119 Type 2 diabetes mellitus without complications: Secondary | ICD-10-CM | POA: Diagnosis not present

## 2023-12-29 DIAGNOSIS — H5213 Myopia, bilateral: Secondary | ICD-10-CM | POA: Diagnosis not present

## 2023-12-29 LAB — HM DIABETES EYE EXAM

## 2024-01-02 ENCOUNTER — Encounter: Payer: Self-pay | Admitting: Internal Medicine

## 2024-02-26 ENCOUNTER — Other Ambulatory Visit

## 2024-02-26 DIAGNOSIS — E785 Hyperlipidemia, unspecified: Secondary | ICD-10-CM | POA: Diagnosis not present

## 2024-02-26 DIAGNOSIS — E1169 Type 2 diabetes mellitus with other specified complication: Secondary | ICD-10-CM

## 2024-02-26 DIAGNOSIS — E119 Type 2 diabetes mellitus without complications: Secondary | ICD-10-CM | POA: Diagnosis not present

## 2024-02-26 DIAGNOSIS — E039 Hypothyroidism, unspecified: Secondary | ICD-10-CM | POA: Diagnosis not present

## 2024-02-27 ENCOUNTER — Other Ambulatory Visit: Payer: Self-pay

## 2024-02-27 DIAGNOSIS — E119 Type 2 diabetes mellitus without complications: Secondary | ICD-10-CM

## 2024-02-27 LAB — TSH: TSH: 1.82 m[IU]/L (ref 0.40–4.50)

## 2024-02-27 LAB — HEPATIC FUNCTION PANEL
AG Ratio: 1.6 (calc) (ref 1.0–2.5)
ALT: 20 U/L (ref 9–46)
AST: 23 U/L (ref 10–35)
Albumin: 4.4 g/dL (ref 3.6–5.1)
Alkaline phosphatase (APISO): 44 U/L (ref 35–144)
Bilirubin, Direct: 0.1 mg/dL (ref 0.0–0.2)
Globulin: 2.8 g/dL (ref 1.9–3.7)
Indirect Bilirubin: 0.5 mg/dL (ref 0.2–1.2)
Total Bilirubin: 0.6 mg/dL (ref 0.2–1.2)
Total Protein: 7.2 g/dL (ref 6.1–8.1)

## 2024-02-27 LAB — LIPID PANEL
Cholesterol: 114 mg/dL
HDL: 60 mg/dL
LDL Cholesterol (Calc): 37 mg/dL
Non-HDL Cholesterol (Calc): 54 mg/dL
Total CHOL/HDL Ratio: 1.9 (calc)
Triglycerides: 89 mg/dL

## 2024-02-27 LAB — HEMOGLOBIN A1C
Hgb A1c MFr Bld: 6.4 % — ABNORMAL HIGH
Mean Plasma Glucose: 137 mg/dL
eAG (mmol/L): 7.6 mmol/L

## 2024-02-27 MED ORDER — ONETOUCH VERIO VI STRP
ORAL_STRIP | 0 refills | Status: AC
Start: 2024-02-27 — End: ?

## 2024-03-04 ENCOUNTER — Other Ambulatory Visit

## 2024-03-07 NOTE — Progress Notes (Signed)
 Patient Care Team: Perri Ronal PARAS, MD as PCP - General (Internal Medicine) Nahser, Aleene PARAS, MD (Inactive) as PCP - Cardiology (Cardiology)  Visit Date: 03/08/24  Subjective:    Patient ID: Thomas Washington , Male   DOB: 12-Nov-1950, 73 y.o.    MRN: [id number]   73 y.o. Male presents today for 6 month follow up for Hypercholesteremia, Diabetes mellitus, Type II , Hypothyroidism. Patient has a past medical history of CAD, Bilateral sensorineural hearing loss, Vitamin D deficiency.   Hx of aortic aneurysm which is stable at 4.3 x 4 cm distally and measired by CT angiogram yearly.   He has been traveling with his wife.   History of Diabetes Mellitus, Type II treated with Metformin  1500 mg daily and Januvia  100 mg . 02/26/2024 HgbA1c 6.4% decreased from 08/28/2023 HgbA1c 6.7%. Annual diabetic eye exam on 12/29/2023 at [facility] without any diabetic retinopathy.   History of Hypercholesteremia treated with Rosuvastatin  40 mg daily. 02/26/2024 Lipid panel normal.  History of Hypothyroidism treated with Levothyroxine  88 mcg daily. 02/26/2024 TSH 1.82 elevated from 04/07/23 TSH 1.71.    History of Vitamin D deficiency treated with Vitamin D3 10,000 units daily   Labs 02/26/2024 HgbA1c 6.4% Otherwise WNL  Past Medical History:  Diagnosis Date   Anginal pain    Coronary artery disease    Diabetes mellitus without complication (HCC)    History of kidney stones    Hypercholesteremia    MRSA (methicillin resistant staph aureus) culture positive      Family History  Problem Relation Age of Onset   Stroke Mother    Heart attack Father    Healthy Sister    Healthy Daughter    Colon cancer Neg Hx    Colon polyps Neg Hx    Esophageal cancer Neg Hx    Rectal cancer Neg Hx    Stomach cancer Neg Hx     Social History   Social History Narrative   Social history: Married with 1 adult daughter.  One grandson.  Another grandchild on the way.  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  Social alcohol consumption.  [place]  Wife is a retired family Conservation officer, nature.       Family history: Mother with history of stroke died with complications of dementia and stroke.  Father died at age 61 suddenly presumably of a sudden MI.  1 sister in good health.          Review of Systems  All other systems reviewed and are negative.       Objective:   Vitals: BP 110/80   Pulse 66   Ht 5' 8 (1.727 m)   Wt 183 lb (83 kg)   SpO2 98%   BMI 27.83 kg/m    Physical Exam Vitals and nursing note reviewed. Exam conducted with a chaperone present.  Constitutional:      General: He is awake. He is not in acute distress.    Appearance: Normal appearance. He is not ill-appearing or toxic-appearing.  HENT:     Head: Normocephalic and atraumatic.     Right Ear: Tympanic membrane, ear canal and external ear normal.     Left Ear: Tympanic membrane, ear canal and external ear normal.     Mouth/Throat:     Pharynx: Oropharynx is clear.  Eyes:     Extraocular Movements: Extraocular movements intact.     Pupils: Pupils are equal, round, and reactive to light.  Neck:     Thyroid : No thyroid  mass, thyromegaly or thyroid  tenderness.     Vascular: No carotid bruit.  Cardiovascular:     Rate and Rhythm: Normal rate and regular rhythm. No extrasystoles are present.    Pulses: Normal pulses.          Dorsalis pedis pulses are 2+ on the right side and 2+ on the left side.       Posterior tibial pulses are 2+ on the right side and 2+ on the left side.     Heart sounds: Normal heart sounds. No murmur heard.    No friction rub. No gallop.  Pulmonary:     Effort: Pulmonary effort is normal. No respiratory distress.     Breath sounds: Normal breath sounds. No decreased breath sounds, wheezing, rhonchi or rales.  Chest:     Chest wall: No mass.  Abdominal:     Palpations: Abdomen is soft. There is no hepatomegaly, splenomegaly or mass.     Tenderness: There is  no abdominal tenderness.     Hernia: No hernia is present.  Genitourinary:    Prostate: Normal. Not enlarged, not tender and no nodules present.     Rectum: Normal. Guaiac result negative.  Musculoskeletal:     Cervical back: Normal range of motion.     Right lower leg: No edema.     Left lower leg: No edema.  Lymphadenopathy:     Cervical: No cervical adenopathy.     Upper Body:     Right upper body: No supraclavicular adenopathy.     Left upper body: No supraclavicular adenopathy.  Skin:    General: Skin is warm and dry.  Neurological:     General: No focal deficit present.     Mental Status: He is alert and oriented to person, place, and time. Mental status is at baseline.     Cranial Nerves: Cranial nerves 2-12 are intact.     Sensory: Sensation is intact.     Motor: Motor function is intact.     Coordination: Coordination is intact.     Gait: Gait is intact.     Deep Tendon Reflexes: Reflexes are normal and symmetric.  Psychiatric:        Attention and Perception: Attention normal.        Mood and Affect: Mood normal.        Speech: Speech normal.        Behavior: Behavior normal. Behavior is cooperative.        Thought Content: Thought content normal.        Cognition and Memory: Cognition and memory normal.        Judgment: Judgment normal.     Diabetic foot exam is normal. Sensation and position sense intact.  Results:   Studies obtained and personally reviewed by me:     TSH, lipid panel, liver functions are WNL Hgb AIC drawn recently 6.4%       Labs:       Component Value Date/Time   NA 138 08/28/2023 1244   NA 142 11/09/2018 1329   K 4.6 08/28/2023 1244   CL 102 08/28/2023 1244   CO2 26 08/28/2023 1244   GLUCOSE 103 (H) 08/28/2023 1244   BUN 16 08/28/2023 1244   BUN 9 11/09/2018 1329   CREATININE 1.09 08/28/2023 1244   CALCIUM  9.5 08/28/2023 1244   PROT 7.2 02/26/2024 1014   PROT 7.4 01/07/2019 0827   ALBUMIN  4.5 01/07/2019 0827  AST 23  02/26/2024 1014   ALT 20 02/26/2024 1014   ALKPHOS 56 01/07/2019 0827   BILITOT 0.6 02/26/2024 1014   BILITOT 0.3 01/07/2019 0827   GFRNONAA 71 08/24/2020 1202   GFRAA 83 08/24/2020 1202     Lab Results  Component Value Date   WBC 7.8 08/28/2023   HGB 15.6 08/28/2023   HCT 48.6 08/28/2023   MCV 89.8 08/28/2023   PLT 231 08/28/2023    Lab Results  Component Value Date   CHOL 114 02/26/2024   HDL 60 02/26/2024   LDLCALC 37 02/26/2024   TRIG 89 02/26/2024   CHOLHDL 1.9 02/26/2024    Lab Results  Component Value Date   HGBA1C 6.4 (H) 02/26/2024     Lab Results  Component Value Date   TSH 1.82 02/26/2024     Lab Results  Component Value Date   PSA 2.70 08/28/2023   PSA 2.46 09/01/2022   PSA 2.21 02/25/2022       Assessment & Plan:   Normal diabetic foot exam. Positional sense is intact.   Diabetes Mellitus, Type II treated with Januvia  and Metformin  1500 mg daily. 02/26/2024 HgbA1c 6.4% decreased from 08/28/2023 HgbA1c 6.7%.Continue same meds   Annual diabetic eye exam on 12/29/2023 at [facility] without any diabetic retinopathy.   Hypercholesteremia treated with Rosuvastatin  40 mg daily. 02/26/2024 Lipid panel normal.  Hypothyroidism: treated with Levothyroxine  88 mcg daily. 02/26/2024 TSH 1.82 elevated from 04/07/23 TSH 1.71.  TSH is normal. Continue same dose of thyroid  replacement.  Vitamin D deficiency treated with Vitamin D3 10,000 units daily   Ascending aortic aneurysm 3.8 x 3.8  proximally and 4.3 x 4 cm distally  Continue diet and exercise regimen. Return for Medicare wellness and CPE in 6 months.Yearly imaging of aneurysm per Cardiology.  I,Makayla C Reid,acting as a scribe for Ronal JINNY Hailstone, MD.,have documented all relevant documentation on the behalf of Ronal JINNY Hailstone, MD,as directed by  Ronal JINNY Hailstone, MD while in the presence of Ronal JINNY Hailstone, MD.

## 2024-03-08 ENCOUNTER — Ambulatory Visit: Admitting: Internal Medicine

## 2024-03-08 ENCOUNTER — Encounter: Payer: Self-pay | Admitting: Internal Medicine

## 2024-03-08 VITALS — BP 110/80 | HR 66 | Ht 68.0 in | Wt 183.0 lb

## 2024-03-08 DIAGNOSIS — Z951 Presence of aortocoronary bypass graft: Secondary | ICD-10-CM

## 2024-03-08 DIAGNOSIS — E1169 Type 2 diabetes mellitus with other specified complication: Secondary | ICD-10-CM | POA: Diagnosis not present

## 2024-03-08 DIAGNOSIS — I2510 Atherosclerotic heart disease of native coronary artery without angina pectoris: Secondary | ICD-10-CM

## 2024-03-08 DIAGNOSIS — E785 Hyperlipidemia, unspecified: Secondary | ICD-10-CM | POA: Diagnosis not present

## 2024-03-08 DIAGNOSIS — I10 Essential (primary) hypertension: Secondary | ICD-10-CM | POA: Diagnosis not present

## 2024-03-08 DIAGNOSIS — I7121 Aneurysm of the ascending aorta, without rupture: Secondary | ICD-10-CM

## 2024-03-08 DIAGNOSIS — E039 Hypothyroidism, unspecified: Secondary | ICD-10-CM | POA: Diagnosis not present

## 2024-03-10 NOTE — Patient Instructions (Signed)
 It was a pleasure to see you toiday. Labs are stable. Continue same medications and follow up annually with Cardiology as well as annual imaging of

## 2024-03-11 ENCOUNTER — Other Ambulatory Visit: Payer: Self-pay

## 2024-03-11 DIAGNOSIS — I2510 Atherosclerotic heart disease of native coronary artery without angina pectoris: Secondary | ICD-10-CM

## 2024-03-11 MED ORDER — TAMSULOSIN HCL 0.4 MG PO CAPS
0.4000 mg | ORAL_CAPSULE | Freq: Every day | ORAL | 1 refills | Status: AC
Start: 2024-03-11 — End: ?

## 2024-03-23 ENCOUNTER — Other Ambulatory Visit: Payer: Self-pay | Admitting: Internal Medicine

## 2024-04-27 ENCOUNTER — Other Ambulatory Visit: Payer: Self-pay | Admitting: Internal Medicine

## 2024-06-13 ENCOUNTER — Other Ambulatory Visit: Payer: Self-pay | Admitting: Internal Medicine

## 2024-09-17 ENCOUNTER — Ambulatory Visit: Admitting: Student in an Organized Health Care Education/Training Program
